# Patient Record
Sex: Female | Born: 1985 | Race: Black or African American | Hispanic: No | Marital: Single | Smoking: Current every day smoker
Health system: Southern US, Community
[De-identification: ages and names within clinical notes are randomized; demographics above are authoritative.]

## PROBLEM LIST (undated history)

## (undated) DIAGNOSIS — E079 Disorder of thyroid, unspecified: Secondary | ICD-10-CM

## (undated) DIAGNOSIS — R791 Abnormal coagulation profile: Secondary | ICD-10-CM

## (undated) DIAGNOSIS — K7030 Alcoholic cirrhosis of liver without ascites: Secondary | ICD-10-CM

---

## 2017-09-10 DIAGNOSIS — F17210 Nicotine dependence, cigarettes, uncomplicated: Secondary | ICD-10-CM | POA: Insufficient documentation

## 2017-09-12 DIAGNOSIS — D259 Leiomyoma of uterus, unspecified: Secondary | ICD-10-CM | POA: Insufficient documentation

## 2017-11-09 DIAGNOSIS — D509 Iron deficiency anemia, unspecified: Secondary | ICD-10-CM | POA: Insufficient documentation

## 2018-12-06 DIAGNOSIS — Z3042 Encounter for surveillance of injectable contraceptive: Secondary | ICD-10-CM | POA: Insufficient documentation

## 2019-11-14 DIAGNOSIS — R1013 Epigastric pain: Secondary | ICD-10-CM | POA: Insufficient documentation

## 2021-06-17 ENCOUNTER — Inpatient Hospital Stay (HOSPITAL_COMMUNITY): Payer: Medicaid Other

## 2021-06-17 ENCOUNTER — Emergency Department (HOSPITAL_COMMUNITY): Payer: Medicaid Other

## 2021-06-17 ENCOUNTER — Encounter (HOSPITAL_COMMUNITY): Payer: Self-pay

## 2021-06-17 ENCOUNTER — Other Ambulatory Visit: Payer: Self-pay

## 2021-06-17 ENCOUNTER — Inpatient Hospital Stay (HOSPITAL_COMMUNITY)
Admission: EM | Admit: 2021-06-17 | Discharge: 2021-06-30 | DRG: 432 | Disposition: A | Discharge status: Home Health | Payer: Medicaid Other | Attending: Internal Medicine | Admitting: Internal Medicine

## 2021-06-17 DIAGNOSIS — K7682 Hepatic encephalopathy: Secondary | ICD-10-CM

## 2021-06-17 DIAGNOSIS — K7460 Unspecified cirrhosis of liver: Secondary | ICD-10-CM

## 2021-06-17 DIAGNOSIS — Z20822 Contact with and (suspected) exposure to covid-19: Secondary | ICD-10-CM | POA: Diagnosis present

## 2021-06-17 DIAGNOSIS — Z781 Physical restraint status: Secondary | ICD-10-CM

## 2021-06-17 DIAGNOSIS — D62 Acute posthemorrhagic anemia: Secondary | ICD-10-CM

## 2021-06-17 DIAGNOSIS — K7040 Alcoholic hepatic failure without coma: Secondary | ICD-10-CM | POA: Diagnosis present

## 2021-06-17 DIAGNOSIS — K922 Gastrointestinal hemorrhage, unspecified: Secondary | ICD-10-CM

## 2021-06-17 DIAGNOSIS — G928 Other toxic encephalopathy: Secondary | ICD-10-CM | POA: Diagnosis not present

## 2021-06-17 DIAGNOSIS — F10231 Alcohol dependence with withdrawal delirium: Secondary | ICD-10-CM | POA: Diagnosis not present

## 2021-06-17 DIAGNOSIS — I959 Hypotension, unspecified: Secondary | ICD-10-CM | POA: Diagnosis present

## 2021-06-17 DIAGNOSIS — R8271 Bacteriuria: Secondary | ICD-10-CM | POA: Diagnosis present

## 2021-06-17 DIAGNOSIS — D689 Coagulation defect, unspecified: Secondary | ICD-10-CM | POA: Diagnosis present

## 2021-06-17 DIAGNOSIS — Z9289 Personal history of other medical treatment: Secondary | ICD-10-CM

## 2021-06-17 DIAGNOSIS — G9340 Encephalopathy, unspecified: Secondary | ICD-10-CM

## 2021-06-17 DIAGNOSIS — J811 Chronic pulmonary edema: Secondary | ICD-10-CM | POA: Diagnosis present

## 2021-06-17 DIAGNOSIS — E872 Acidosis: Secondary | ICD-10-CM | POA: Diagnosis present

## 2021-06-17 DIAGNOSIS — D6959 Other secondary thrombocytopenia: Secondary | ICD-10-CM | POA: Diagnosis present

## 2021-06-17 DIAGNOSIS — E8809 Other disorders of plasma-protein metabolism, not elsewhere classified: Secondary | ICD-10-CM | POA: Diagnosis present

## 2021-06-17 DIAGNOSIS — E538 Deficiency of other specified B group vitamins: Secondary | ICD-10-CM | POA: Diagnosis present

## 2021-06-17 DIAGNOSIS — J9601 Acute respiratory failure with hypoxia: Secondary | ICD-10-CM | POA: Diagnosis present

## 2021-06-17 DIAGNOSIS — K7031 Alcoholic cirrhosis of liver with ascites: Secondary | ICD-10-CM | POA: Diagnosis present

## 2021-06-17 DIAGNOSIS — T424X5A Adverse effect of benzodiazepines, initial encounter: Secondary | ICD-10-CM | POA: Diagnosis not present

## 2021-06-17 DIAGNOSIS — F17210 Nicotine dependence, cigarettes, uncomplicated: Secondary | ICD-10-CM | POA: Diagnosis present

## 2021-06-17 DIAGNOSIS — K7290 Hepatic failure, unspecified without coma: Secondary | ICD-10-CM | POA: Diagnosis present

## 2021-06-17 DIAGNOSIS — R188 Other ascites: Secondary | ICD-10-CM

## 2021-06-17 DIAGNOSIS — D539 Nutritional anemia, unspecified: Secondary | ICD-10-CM | POA: Diagnosis present

## 2021-06-17 DIAGNOSIS — E876 Hypokalemia: Secondary | ICD-10-CM | POA: Diagnosis not present

## 2021-06-17 DIAGNOSIS — K766 Portal hypertension: Secondary | ICD-10-CM | POA: Diagnosis present

## 2021-06-17 DIAGNOSIS — N179 Acute kidney failure, unspecified: Secondary | ICD-10-CM | POA: Diagnosis present

## 2021-06-17 DIAGNOSIS — Z5900 Homelessness unspecified: Secondary | ICD-10-CM | POA: Diagnosis not present

## 2021-06-17 DIAGNOSIS — E8770 Fluid overload, unspecified: Secondary | ICD-10-CM | POA: Diagnosis present

## 2021-06-17 DIAGNOSIS — J189 Pneumonia, unspecified organism: Secondary | ICD-10-CM | POA: Diagnosis present

## 2021-06-17 DIAGNOSIS — R062 Wheezing: Secondary | ICD-10-CM

## 2021-06-17 DIAGNOSIS — K7201 Acute and subacute hepatic failure with coma: Secondary | ICD-10-CM

## 2021-06-17 DIAGNOSIS — A419 Sepsis, unspecified organism: Secondary | ICD-10-CM

## 2021-06-17 DIAGNOSIS — R161 Splenomegaly, not elsewhere classified: Secondary | ICD-10-CM | POA: Diagnosis present

## 2021-06-17 DIAGNOSIS — K7011 Alcoholic hepatitis with ascites: Secondary | ICD-10-CM | POA: Diagnosis present

## 2021-06-17 DIAGNOSIS — E871 Hypo-osmolality and hyponatremia: Secondary | ICD-10-CM | POA: Diagnosis present

## 2021-06-17 DIAGNOSIS — K7200 Acute and subacute hepatic failure without coma: Secondary | ICD-10-CM

## 2021-06-17 DIAGNOSIS — Z8249 Family history of ischemic heart disease and other diseases of the circulatory system: Secondary | ICD-10-CM

## 2021-06-17 DIAGNOSIS — D649 Anemia, unspecified: Secondary | ICD-10-CM

## 2021-06-17 DIAGNOSIS — Z0189 Encounter for other specified special examinations: Secondary | ICD-10-CM

## 2021-06-17 HISTORY — DX: Disorder of thyroid, unspecified: E07.9

## 2021-06-17 LAB — CBC WITH DIFFERENTIAL/PLATELET
Abs Immature Granulocytes: 0.15 10*3/uL — ABNORMAL HIGH (ref 0.00–0.07)
Basophils Absolute: 0 10*3/uL (ref 0.0–0.1)
Basophils Relative: 0 %
Eosinophils Absolute: 0 10*3/uL (ref 0.0–0.5)
Eosinophils Relative: 0 %
HCT: 7.3 % — ABNORMAL LOW (ref 36.0–46.0)
Hemoglobin: 1.8 g/dL — CL (ref 12.0–15.0)
Immature Granulocytes: 1 %
Lymphocytes Relative: 11 %
Lymphs Abs: 2.1 10*3/uL (ref 0.7–4.0)
MCH: 31 pg (ref 26.0–34.0)
MCHC: 24.7 g/dL — ABNORMAL LOW (ref 30.0–36.0)
MCV: 125.9 fL — ABNORMAL HIGH (ref 80.0–100.0)
Monocytes Absolute: 2.1 10*3/uL — ABNORMAL HIGH (ref 0.1–1.0)
Monocytes Relative: 11 %
Neutro Abs: 14.4 10*3/uL — ABNORMAL HIGH (ref 1.7–7.7)
Neutrophils Relative %: 77 %
Platelets: 137 10*3/uL — ABNORMAL LOW (ref 150–400)
RBC: 0.58 MIL/uL — ABNORMAL LOW (ref 3.87–5.11)
WBC: 18.8 10*3/uL — ABNORMAL HIGH (ref 4.0–10.5)
nRBC: 1.3 % — ABNORMAL HIGH (ref 0.0–0.2)

## 2021-06-17 LAB — COMPREHENSIVE METABOLIC PANEL
ALT: 26 U/L (ref 0–44)
AST: 125 U/L — ABNORMAL HIGH (ref 15–41)
Albumin: 1.3 g/dL — ABNORMAL LOW (ref 3.5–5.0)
Alkaline Phosphatase: 117 U/L (ref 38–126)
Anion gap: 19 — ABNORMAL HIGH (ref 5–15)
BUN: 34 mg/dL — ABNORMAL HIGH (ref 6–20)
CO2: 11 mmol/L — ABNORMAL LOW (ref 22–32)
Calcium: 7.9 mg/dL — ABNORMAL LOW (ref 8.9–10.3)
Chloride: 103 mmol/L (ref 98–111)
Creatinine, Ser: 3.32 mg/dL — ABNORMAL HIGH (ref 0.44–1.00)
GFR, Estimated: 18 mL/min — ABNORMAL LOW (ref >60)
Glucose, Bld: 64 mg/dL — ABNORMAL LOW (ref 70–99)
Potassium: 5 mmol/L (ref 3.5–5.1)
Sodium: 133 mmol/L — ABNORMAL LOW (ref 135–145)
Total Bilirubin: 5.9 mg/dL — ABNORMAL HIGH (ref 0.3–1.2)
Total Protein: 8 g/dL (ref 6.5–8.1)

## 2021-06-17 LAB — VITAMIN B12: Vitamin B-12: 1496 pg/mL — ABNORMAL HIGH (ref 180–914)

## 2021-06-17 LAB — URINALYSIS, ROUTINE W REFLEX MICROSCOPIC
Glucose, UA: NEGATIVE mg/dL
Hgb urine dipstick: NEGATIVE
Ketones, ur: 15 mg/dL — AB
Nitrite: NEGATIVE
Protein, ur: 30 mg/dL — AB
Specific Gravity, Urine: 1.02 (ref 1.005–1.030)
pH: 5 (ref 5.0–8.0)

## 2021-06-17 LAB — POCT I-STAT 7, (LYTES, BLD GAS, ICA,H+H)
Acid-base deficit: 1 mmol/L (ref 0.0–2.0)
Bicarbonate: 24.1 mmol/L (ref 20.0–28.0)
Calcium, Ion: 1.13 mmol/L — ABNORMAL LOW (ref 1.15–1.40)
HCT: 18 % — ABNORMAL LOW (ref 36.0–46.0)
Hemoglobin: 6.1 g/dL — CL (ref 12.0–15.0)
O2 Saturation: 100 %
Patient temperature: 97.4
Potassium: 4.1 mmol/L (ref 3.5–5.1)
Sodium: 139 mmol/L (ref 135–145)
TCO2: 25 mmol/L (ref 22–32)
pCO2 arterial: 41.4 mmHg (ref 32.0–48.0)
pH, Arterial: 7.37 (ref 7.350–7.450)
pO2, Arterial: 503 mmHg — ABNORMAL HIGH (ref 83.0–108.0)

## 2021-06-17 LAB — URINALYSIS, MICROSCOPIC (REFLEX): WBC, UA: >50 WBC/hpf (ref 0–5)

## 2021-06-17 LAB — RESP PANEL BY RT-PCR (FLU A&B, COVID) ARPGX2
Influenza A by PCR: NEGATIVE
Influenza B by PCR: NEGATIVE
SARS Coronavirus 2 by RT PCR: NEGATIVE

## 2021-06-17 LAB — RETICULOCYTES
Immature Retic Fract: 35.7 % — ABNORMAL HIGH (ref 2.3–15.9)
RBC.: 1.35 MIL/uL — ABNORMAL LOW (ref 3.87–5.11)
Retic Count, Absolute: 89.8 10*3/uL (ref 19.0–186.0)
Retic Ct Pct: 6.8 % — ABNORMAL HIGH (ref 0.4–3.1)

## 2021-06-17 LAB — IRON AND TIBC
Iron: 41 ug/dL (ref 28–170)
Saturation Ratios: 21 % (ref 10.4–31.8)
TIBC: 193 ug/dL — ABNORMAL LOW (ref 250–450)
UIBC: 152 ug/dL

## 2021-06-17 LAB — PREPARE RBC (CROSSMATCH)

## 2021-06-17 LAB — POC OCCULT BLOOD, ED: Fecal Occult Bld: NEGATIVE

## 2021-06-17 LAB — FERRITIN: Ferritin: 52 ng/mL (ref 11–307)

## 2021-06-17 LAB — APTT: aPTT: 44 seconds — ABNORMAL HIGH (ref 24–36)

## 2021-06-17 LAB — CBG MONITORING, ED
Glucose-Capillary: 126 mg/dL — ABNORMAL HIGH (ref 70–99)
Glucose-Capillary: 62 mg/dL — ABNORMAL LOW (ref 70–99)
Glucose-Capillary: 118 mg/dL — ABNORMAL HIGH (ref 70–99)
Glucose-Capillary: 105 mg/dL — ABNORMAL HIGH (ref 70–99)

## 2021-06-17 LAB — PROTIME-INR
INR: 2.3 — ABNORMAL HIGH (ref 0.8–1.2)
Prothrombin Time: 25 seconds — ABNORMAL HIGH (ref 11.4–15.2)

## 2021-06-17 LAB — LACTIC ACID, PLASMA: Lactic Acid, Venous: 10.6 mmol/L (ref 0.5–1.9)

## 2021-06-17 LAB — HEMOGLOBIN AND HEMATOCRIT, BLOOD
HCT: 14.1 % — ABNORMAL LOW (ref 36.0–46.0)
Hemoglobin: 4.2 g/dL — CL (ref 12.0–15.0)

## 2021-06-17 LAB — GLUCOSE, CAPILLARY
Glucose-Capillary: 112 mg/dL — ABNORMAL HIGH (ref 70–99)
Glucose-Capillary: 120 mg/dL — ABNORMAL HIGH (ref 70–99)

## 2021-06-17 LAB — I-STAT BETA HCG BLOOD, ED (MC, WL, AP ONLY): I-stat hCG, quantitative: <5 m[IU]/mL (ref <5)

## 2021-06-17 LAB — FOLATE: Folate: 5.6 ng/mL — ABNORMAL LOW (ref >5.9)

## 2021-06-17 LAB — ABO/RH: ABO/RH(D): B POS

## 2021-06-17 LAB — AMMONIA: Ammonia: 82 umol/L — ABNORMAL HIGH (ref 9–35)

## 2021-06-17 MED ORDER — ORAL CARE MOUTH RINSE
15.0000 mL | OROMUCOSAL | Status: DC
  Administered 2021-06-17 – 2021-06-18 (×9): 15 mL via OROMUCOSAL

## 2021-06-17 MED ORDER — LACTATED RINGERS IV BOLUS (SEPSIS)
1000.0000 mL | Freq: Once | INTRAVENOUS | Status: AC
  Administered 2021-06-17: 1000 mL via INTRAVENOUS

## 2021-06-17 MED ORDER — SODIUM CHLORIDE 0.9 % IV SOLN
250.0000 mL | INTRAVENOUS | Status: DC
  Administered 2021-06-17 – 2021-06-18 (×2): 250 mL via INTRAVENOUS

## 2021-06-17 MED ORDER — LACTULOSE ENEMA
300.0000 mL | Freq: Every day | ORAL | Status: DC
  Filled 2021-06-17: qty 300

## 2021-06-17 MED ORDER — SODIUM CHLORIDE 0.9 % IV SOLN
50.0000 ug/h | INTRAVENOUS | Status: DC
  Administered 2021-06-17 – 2021-06-18 (×2): 50 ug/h via INTRAVENOUS
  Filled 2021-06-17 (×2): qty 1

## 2021-06-17 MED ORDER — SODIUM CHLORIDE 0.9 % IV SOLN
2.0000 g | INTRAVENOUS | Status: DC
  Administered 2021-06-17 – 2021-06-18 (×2): 2 g via INTRAVENOUS
  Filled 2021-06-17 (×2): qty 20

## 2021-06-17 MED ORDER — ROCURONIUM BROMIDE 10 MG/ML (PF) SYRINGE
PREFILLED_SYRINGE | INTRAVENOUS | Status: AC
  Filled 2021-06-17: qty 10

## 2021-06-17 MED ORDER — METRONIDAZOLE 500 MG/100ML IV SOLN
500.0000 mg | Freq: Once | INTRAVENOUS | Status: AC
  Administered 2021-06-17: 500 mg via INTRAVENOUS
  Filled 2021-06-17: qty 100

## 2021-06-17 MED ORDER — PROPOFOL 1000 MG/100ML IV EMUL: 0.0000 ug/kg/min | INTRAVENOUS | Status: DC

## 2021-06-17 MED ORDER — POLYETHYLENE GLYCOL 3350 17 G PO PACK: 17.0000 g | PACK | Freq: Every day | ORAL | Status: DC

## 2021-06-17 MED ORDER — PANTOPRAZOLE SODIUM 40 MG IV SOLR: 40.0000 mg | Freq: Every day | INTRAVENOUS | Status: DC

## 2021-06-17 MED ORDER — FENTANYL CITRATE (PF) 100 MCG/2ML IJ SOLN
50.0000 ug | INTRAMUSCULAR | Status: DC | PRN
  Administered 2021-06-18: 50 ug via INTRAVENOUS
  Filled 2021-06-17: qty 2

## 2021-06-17 MED ORDER — FUROSEMIDE 10 MG/ML IJ SOLN
20.0000 mg | Freq: Once | INTRAMUSCULAR | Status: AC
  Administered 2021-06-17: 20 mg via INTRAVENOUS
  Filled 2021-06-17: qty 2

## 2021-06-17 MED ORDER — SODIUM CHLORIDE 0.9% IV SOLUTION: Freq: Once | INTRAVENOUS | Status: DC

## 2021-06-17 MED ORDER — PANTOPRAZOLE SODIUM 40 MG IV SOLR
40.0000 mg | Freq: Two times a day (BID) | INTRAVENOUS | Status: DC
  Administered 2021-06-17 – 2021-06-18 (×3): 40 mg via INTRAVENOUS
  Filled 2021-06-17 (×3): qty 40

## 2021-06-17 MED ORDER — DEXTROSE 50 % IV SOLN
INTRAVENOUS | Status: AC
  Administered 2021-06-17: 50 mL
  Filled 2021-06-17: qty 50

## 2021-06-17 MED ORDER — SODIUM CHLORIDE 0.9 % IV SOLN
10.0000 mL/h | Freq: Once | INTRAVENOUS | Status: AC
  Administered 2021-06-17: 10 mL/h via INTRAVENOUS

## 2021-06-17 MED ORDER — LACTULOSE 10 GM/15ML PO SOLN
30.0000 g | Freq: Two times a day (BID) | ORAL | Status: DC
  Administered 2021-06-17 – 2021-06-18 (×3): 30 g
  Filled 2021-06-17 (×3): qty 45

## 2021-06-17 MED ORDER — SODIUM CHLORIDE 0.9 % IV SOLN
2.0000 g | Freq: Once | INTRAVENOUS | Status: AC
  Administered 2021-06-17: 2 g via INTRAVENOUS
  Filled 2021-06-17: qty 2

## 2021-06-17 MED ORDER — CHLORHEXIDINE GLUCONATE 0.12% ORAL RINSE (MEDLINE KIT)
15.0000 mL | Freq: Two times a day (BID) | OROMUCOSAL | Status: DC
  Administered 2021-06-17 – 2021-06-18 (×2): 15 mL via OROMUCOSAL

## 2021-06-17 MED ORDER — MIDAZOLAM HCL 2 MG/2ML IJ SOLN
INTRAMUSCULAR | Status: AC
  Administered 2021-06-17: 1 mg via INTRAVENOUS
  Filled 2021-06-17: qty 2

## 2021-06-17 MED ORDER — FENTANYL CITRATE (PF) 100 MCG/2ML IJ SOLN: 50.0000 ug | INTRAMUSCULAR | Status: DC | PRN

## 2021-06-17 MED ORDER — FENTANYL CITRATE (PF) 100 MCG/2ML IJ SOLN
INTRAMUSCULAR | Status: AC | PRN
  Administered 2021-06-17: 50 ug via INTRAVENOUS

## 2021-06-17 MED ORDER — VANCOMYCIN HCL 10 G IV SOLR
1750.0000 mg | Freq: Once | INTRAVENOUS | Status: AC
  Administered 2021-06-17: 1750 mg via INTRAVENOUS
  Filled 2021-06-17: qty 1750

## 2021-06-17 MED ORDER — THIAMINE HCL 100 MG/ML IJ SOLN
100.0000 mg | Freq: Once | INTRAMUSCULAR | Status: AC
  Administered 2021-06-17: 100 mg via INTRAVENOUS
  Filled 2021-06-17: qty 2

## 2021-06-17 MED ORDER — NOREPINEPHRINE 4 MG/250ML-% IV SOLN
INTRAVENOUS | Status: AC
  Administered 2021-06-17: 2 ug/min via INTRAVENOUS
  Filled 2021-06-17: qty 250

## 2021-06-17 MED ORDER — ROCURONIUM BROMIDE 50 MG/5ML IV SOLN
INTRAVENOUS | Status: AC | PRN
  Administered 2021-06-17: 80 mg via INTRAVENOUS

## 2021-06-17 MED ORDER — NOREPINEPHRINE 4 MG/250ML-% IV SOLN
2.0000 ug/min | INTRAVENOUS | Status: DC
  Administered 2021-06-18: 2 ug/min via INTRAVENOUS

## 2021-06-17 MED ORDER — SODIUM CHLORIDE 0.9 % IV SOLN
500.0000 mg | INTRAVENOUS | Status: DC
  Administered 2021-06-17 – 2021-06-18 (×2): 500 mg via INTRAVENOUS
  Filled 2021-06-17 (×3): qty 500

## 2021-06-17 MED ORDER — DOCUSATE SODIUM 50 MG/5ML PO LIQD: 100.0000 mg | Freq: Two times a day (BID) | ORAL | Status: DC

## 2021-06-17 MED ORDER — VANCOMYCIN VARIABLE DOSE PER UNSTABLE RENAL FUNCTION (PHARMACIST DOSING): Status: DC

## 2021-06-17 MED ORDER — ETOMIDATE 2 MG/ML IV SOLN
INTRAVENOUS | Status: AC | PRN
  Administered 2021-06-17: 20 mg via INTRAVENOUS

## 2021-06-17 MED ORDER — SODIUM CHLORIDE 0.9 % IV SOLN: 2.0000 g | INTRAVENOUS | Status: DC

## 2021-06-17 MED ORDER — THIAMINE HCL 100 MG PO TABS
500.0000 mg | ORAL_TABLET | Freq: Every day | ORAL | Status: DC
  Administered 2021-06-17: 500 mg
  Filled 2021-06-17 (×2): qty 5

## 2021-06-17 MED ORDER — PHENYLEPHRINE 40 MCG/ML (10ML) SYRINGE FOR IV PUSH (FOR BLOOD PRESSURE SUPPORT)
120.0000 ug | PREFILLED_SYRINGE | Freq: Once | INTRAVENOUS | Status: AC
  Administered 2021-06-17: 120 ug via INTRAVENOUS

## 2021-06-17 MED ORDER — LACTATED RINGERS IV SOLN: INTRAVENOUS | Status: DC

## 2021-06-17 MED ORDER — SUCCINYLCHOLINE CHLORIDE 200 MG/10ML IV SOSY
PREFILLED_SYRINGE | INTRAVENOUS | Status: AC
  Filled 2021-06-17: qty 10

## 2021-06-17 MED ORDER — MIDAZOLAM HCL 2 MG/2ML IJ SOLN: 1.0000 mg | Freq: Once | INTRAMUSCULAR | Status: AC

## 2021-06-17 MED ORDER — ETOMIDATE 2 MG/ML IV SOLN
INTRAVENOUS | Status: AC
  Filled 2021-06-17: qty 20

## 2021-06-17 MED ORDER — LACTULOSE 10 GM/15ML PO SOLN
30.0000 g | Freq: Every day | ORAL | Status: DC
  Filled 2021-06-17: qty 45

## 2021-06-17 MED ORDER — LEVETIRACETAM IN NACL 500 MG/100ML IV SOLN
500.0000 mg | Freq: Two times a day (BID) | INTRAVENOUS | Status: DC
  Administered 2021-06-18: 500 mg via INTRAVENOUS
  Filled 2021-06-17 (×2): qty 100

## 2021-06-17 MED ORDER — OCTREOTIDE LOAD VIA INFUSION
50.0000 ug | Freq: Once | INTRAVENOUS | Status: AC
  Administered 2021-06-17: 50 ug via INTRAVENOUS
  Filled 2021-06-17: qty 25

## 2021-06-17 MED ORDER — FENTANYL CITRATE (PF) 100 MCG/2ML IJ SOLN
INTRAMUSCULAR | Status: AC
  Filled 2021-06-17: qty 2

## 2021-06-17 MED ORDER — FOLIC ACID 1 MG PO TABS
1.0000 mg | ORAL_TABLET | Freq: Every day | ORAL | Status: DC
  Administered 2021-06-17 – 2021-06-18 (×2): 1 mg
  Filled 2021-06-17 (×2): qty 1

## 2021-06-17 MED ORDER — LEVETIRACETAM IN NACL 1500 MG/100ML IV SOLN
1500.0000 mg | Freq: Once | INTRAVENOUS | Status: AC
  Administered 2021-06-17: 1500 mg via INTRAVENOUS

## 2021-06-17 MED ORDER — LACTULOSE 10 GM/15ML PO SOLN: 30.0000 g | Freq: Two times a day (BID) | ORAL | Status: DC

## 2021-06-17 NOTE — ED Notes (Signed)
Critical Care provider at the bedside. Provider stated pt sounds wheezy to run blood not fast. Rate should not exceed 150 ml.

## 2021-06-17 NOTE — ED Triage Notes (Signed)
Pt from [facility] with ems for ams and possible sepsis. Pt lsn last night at unknown time by her boyfriend. This morning he could not wake her up and called ems. Pt responsive to painful stimuli but nonverbal. Pupils 38mm equal nonreactive. Pitting edema up to her abd, skin jaundice. They only hx provided is hypothyroidism and alcohol use. Last alcoholic drink was 2 weeks ago.  BP 82/44 HR 109 Rr35 ETCO2 15 CBG 96

## 2021-06-17 NOTE — ED Provider Notes (Addendum)
Patient admitted to the medical ICU for further care.  Severe anemia with a hemoglobin of 1.8.  Likely multifactorial.  No evidence of acute bleed.  She has gotten 2 units of packed red blood cells with improvement of her hemoglobin to 4.2.  Right now she will open eyes spontaneously but does not follow commands, she responds to painful stimuli.  She has a lactic of 10.  May be multifocal pneumonia.  COVID test is negative.  Some liver dysfunction with elevated bilirubin and ammonia and will consult gastroenterology.  Does not appear to be peritonitis on exam.  No fever.  Blood pressure has been stable in the 90s.  ICU team to admit.  ICU team will complete GI work-up with CT scans. [facility] team aware.  This chart was dictated using voice recognition software.  Despite best efforts to proofread,  errors can occur which can change the documentation meaning.    Virgina Norfolk, DO 06/17/21 1741    Virgina Norfolk, DO 06/17/21 1801    Virgina Norfolk, DO 06/17/21 1807

## 2021-06-17 NOTE — Procedures (Addendum)
Patient Name: [name]  MRN: [id number]  Epilepsy Attending: Charlsie Quest  Referring Physician/Provider: Pia Mau, PA Date: 06/17/2021 Duration: 23.87mins  Patient history: 35yo F with AMS. EEG to evaluate for seizure.   Level of alertness:  lethargic   AEDs during EEG study: LEV, propofol  Technical aspects: This EEG study was done with scalp electrodes positioned according to the 10-20 International system of electrode placement. Electrical activity was acquired at a sampling rate of 500Hz  and reviewed with a high frequency filter of 70Hz  and a low frequency filter of 1Hz . EEG data were recorded continuously and digitally stored.   Description: EEG showed continuous generalized 3 to 6 Hz theta-delta slowing. Generalized periodic discharges with triphasic morphology at  1-1.5Hz  were also noted. Hyperventilation and photic stimulation were not performed.     ABNORMALITY - Periodic discharges with triphasic morphology, generalized ( GPDs) - Continuous slow, generalized  IMPRESSION: This study showed generalized periodic discharges with triphasic morphology at  1-1.5Hz  which is on the ictal-interictal continuum with low to intermediate potential for seizures. Additionally, there is severe diffuse encephalopathy, nonspecific etiology but likely related to sedation, toxic-metabolic etiology. No seizures  were seen throughout the recording.  Shenell Rogalski 

## 2021-06-17 NOTE — Progress Notes (Signed)
LTM EEG hooked up and running - no initial skin breakdown - push button tested - neuro notified. [facility] monitored, Event button test confirmed by [facility].   

## 2021-06-17 NOTE — Sepsis Progress Note (Signed)
LA came back as error. Secure chat with bedside nurse and she is going to repeat the lab draw.

## 2021-06-17 NOTE — Progress Notes (Signed)
eLink Physician-Brief Progress Note Patient Name: Michelle Little DOB: 01-07-1986 MRN: [id number]   Date of Service  06/17/2021  HPI/Events of Note  17F with hx of EtOH abuse who was BIBA after her boyfriend could not awaken her this morning. Hypotensive to 82/44 in ED, WBC 18.8k, Hgb 1.8, Plt 137, HCO3 11, glucose 64. Patient was given 2L LR and 2 units of blood with repeat Hgb of 4.8. A third unit of blood was then ordered which is transfusing now.  Patient is relatively stable. She is intubated with vent settings of 430x15, 5, 100%. Vitals are HR 104 (ST), BP 118/84 (MAP 96), RR 15 (synchronous with vent), SpO2 100%. She is on levophed at 2 mcg/min which should be able to be weaned off.   eICU Interventions  # Neuro: - HE: Lactulose 30g BID per OG tube - Propofol/fentanyl for sedation/analgesia while intubated as needed. - EEG + Keppra also was ordered by primary team for concern of NCSE. Plan to d/c Keppra if EEG reassuring.  # Cardiac: - Hypotension: S/p fluid/blood resuscitation. On levophed for MAP >\= 65 -- continue to wean. Correct metabolic acidosis. Transfuse to Hgb 7. ABX empirically for sepsis/CAP/SBP ppx.  # Pulmonary: - Multifocal Pneumonia: Overt evidence of aspiration pneumonia on CT Chest. Started on ceftriaxone/azithromycin. - Respiratory Failure: Vent Mode: PRVC FiO2 (%):  [100 %] 100 % Set Rate:  [15 bmp-18 bmp] 15 bmp Vt Set:  [430 mL] 430 mL PEEP:  [5 cmH20] 5 cmH20 Plateau Pressure:  [19 cmH20-21 cmH20] 21 cmH20 - Stop mIVF given gross anarsarca and adequate volume/blood resuscitation at this point. Patient is making good UOP. - Wean FiO2 by SpO2 as tolerated.  # Heme: - Uncertain cause of severe anemia. ? GI bleed (most likely given cirrhosis but no melena or BRBPR, so chronic/intermittent bleeding higher on differential). - GI consult in AM with EGD/colonoscopy most likely needed. - Q6H CBCs (and a 1hr post-transfusion check after 3rd pRBC unit). Transfuse to  Hgb of 7.  # GI: - EtOH cirrhosis: Needs to be counseled regarding EtOH use / liver transplantation. - GI consult as above. - ? GI Bleed: Ceftriaxone for SBP ppx, Protonix IV Q12H, octreotide, Q6H CBC, transfuse to Hgb 7.  # ID: Aspiration pneumonia. Pyuria with negative nitrite on UA. - Ordered tracheal aspirate for culture. - F/u Bcx/UCx - CTX/azithromycin empirically.  # Renal: - Severe lactic acidosis: Resolved after volume resuscitation (latest ABG 7.37/41/503/25/BE -1.0).  # Endocrine: - Q4H CBGs  DVT PPX: SCDs only GI PPX: Protonix IV Q12H     Intervention Category Evaluation Type: New Patient Evaluation  Michelle Little 06/17/2021, 7:53 PM

## 2021-06-17 NOTE — ED Notes (Signed)
Notified provider about pt MAP (59/61)

## 2021-06-17 NOTE — ED Notes (Signed)
Patient transported to CT 

## 2021-06-17 NOTE — ED Provider Notes (Signed)
[facility] EMERGENCY DEPARTMENT Provider Note   CSN: [id number] Arrival date & time: 06/17/21  4818     History Chief Complaint  Patient presents with   Altered Mental Status   Code Sepsis    Michelle Little is a 35 y.o. female.  HPI 10:03 AM-called to the room to evaluate patient with apparent sepsis.  She presents by EMS.  She is unable to give history.   Boyfriend arrived later and reported that he was sleeping with her last night and this morning he could not make her wake up.  He states that for the last 2 weeks she has been having difficulty walking because of swelling in her legs.  She has stayed in bed pretty much all the time.  She has not been eating and drinking as much as usual.  He has not noticed anything else about her.  Level 5 caveat-altered mental status    Past Medical History:  Diagnosis Date   Thyroid disease     There are no problems to display for this patient.   History reviewed. No pertinent surgical history.   OB History   No obstetric history on file.     No family history on file.     Home Medications Prior to Admission medications   Medication Sig Start Date End Date Taking? Authorizing Provider  ibuprofen (ADVIL) 200 MG tablet Take 400 mg by mouth every 6 (six) hours as needed for headache or mild pain.   Yes [provider]    Allergies    Patient has no allergy information on record.  Review of Systems   Review of Systems  Unable to perform ROS: Mental status change   Physical Exam Updated Vital Signs BP (!) 93/58   Pulse (!) 103   Temp (!) 97 F (36.1 C) (Axillary)   Resp (!) 29   Ht 5\' 4"  (1.626 m)   Wt 81.6 kg   SpO2 100%   BMI 30.90 kg/m   Physical Exam Vitals and nursing note reviewed.  Constitutional:      General: She is in acute distress.     Appearance: She is well-developed. She is ill-appearing. She is not toxic-appearing.  HENT:     Head: Normocephalic and atraumatic.      Right Ear: External ear normal.     Left Ear: External ear normal.     Mouth/Throat:     Mouth: Mucous membranes are dry.     Pharynx: No oropharyngeal exudate or posterior oropharyngeal erythema.  Eyes:     General: Scleral icterus present.     Pupils: Pupils are equal, round, and reactive to light.  Neck:     Trachea: Phonation normal.  Cardiovascular:     Rate and Rhythm: Normal rate and regular rhythm.     Heart sounds: Normal heart sounds.  Pulmonary:     Effort: Pulmonary effort is normal. No respiratory distress.     Breath sounds: Normal breath sounds. No stridor.  Abdominal:     General: There is no distension.     Palpations: Abdomen is soft. There is no mass.     Tenderness: There is no abdominal tenderness.     Hernia: No hernia is present.  Musculoskeletal:        General: Normal range of motion.     Cervical back: Normal range of motion and neck supple.  Skin:    General: Skin is warm and dry.     Coloration: Skin  is jaundiced and pale.  Neurological:     Mental Status: She is lethargic.     Cranial Nerves: No cranial nerve deficit.     Motor: No abnormal muscle tone.     Coordination: Coordination normal.     Comments: Slurred speech. Uncooperative for following commands.  Psychiatric:     Comments: Lethargic    ED Results / Procedures / Treatments   Labs (all labs ordered are listed, but only abnormal results are displayed) Labs Reviewed  LACTIC ACID, PLASMA - Abnormal; Notable for the following components:      Result Value   Lactic Acid, Venous 10.6 (*)    All other components within normal limits  LACTIC ACID, PLASMA - Abnormal; Notable for the following components:   Lactic Acid, Venous 10.6 (*)    All other components within normal limits  COMPREHENSIVE METABOLIC PANEL - Abnormal; Notable for the following components:   Sodium 133 (*)    CO2 11 (*)    Glucose, Bld 64 (*)    BUN 34 (*)    Creatinine, Ser 3.32 (*)    Calcium 7.9 (*)     Albumin 1.3 (*)    AST 125 (*)    Total Bilirubin 5.9 (*)    GFR, Estimated 18 (*)    Anion gap 19 (*)    All other components within normal limits  PROTIME-INR - Abnormal; Notable for the following components:   Prothrombin Time 25.0 (*)    INR 2.3 (*)    All other components within normal limits  APTT - Abnormal; Notable for the following components:   aPTT 44 (*)    All other components within normal limits  URINALYSIS, ROUTINE W REFLEX MICROSCOPIC - Abnormal; Notable for the following components:   Color, Urine AMBER (*)    APPearance HAZY (*)    Bilirubin Urine LARGE (*)    Ketones, ur 15 (*)    Protein, ur 30 (*)    Leukocytes,Ua MODERATE (*)    All other components within normal limits  CBC WITH DIFFERENTIAL/PLATELET - Abnormal; Notable for the following components:   WBC 18.8 (*)    RBC 0.58 (*)    Hemoglobin 1.8 (*)    HCT 7.3 (*)    MCV 125.9 (*)    MCHC 24.7 (*)    Platelets 137 (*)    nRBC 1.3 (*)    Neutro Abs 14.4 (*)    Monocytes Absolute 2.1 (*)    Abs Immature Granulocytes 0.15 (*)    All other components within normal limits  URINALYSIS, MICROSCOPIC (REFLEX) - Abnormal; Notable for the following components:   Bacteria, UA FEW (*)    Non Squamous Epithelial PRESENT (*)    All other components within normal limits  AMMONIA - Abnormal; Notable for the following components:   Ammonia 82 (*)    All other components within normal limits  CBG MONITORING, ED - Abnormal; Notable for the following components:   Glucose-Capillary 62 (*)    All other components within normal limits  CBG MONITORING, ED - Abnormal; Notable for the following components:   Glucose-Capillary 126 (*)    All other components within normal limits  CULTURE, BLOOD (ROUTINE X 2)  CULTURE, BLOOD (ROUTINE X 2)  RESP PANEL BY RT-PCR (FLU A&B, COVID) ARPGX2  MRSA NEXT GEN BY PCR, NASAL  CBC WITH DIFFERENTIAL/PLATELET  BLOOD GAS, ARTERIAL  I-STAT BETA HCG BLOOD, ED (MC, WL, AP ONLY)   I-STAT VENOUS BLOOD GAS, ED  POC OCCULT BLOOD, ED  TYPE AND SCREEN  ABO/RH  PREPARE RBC (CROSSMATCH)  PREPARE RBC (CROSSMATCH)    EKG EKG Interpretation  Date/Time:  Monday June 17 2021 10:18:58 EDT Ventricular Rate:  109 PR Interval:  135 QRS Duration: 91 QT Interval:  358 QTC Calculation: 483 R Axis:   64 Text Interpretation: Sinus tachycardia Low voltage, precordial leads Nonspecific repol abnormality, diffuse leads No old tracing to compare Confirmed by Mancel Bale [phone]) on 06/17/2021 10:44:59 AM  Radiology DG Chest Port 1 View  Result Date: 06/17/2021 CLINICAL DATA:  Altered mental status Sepsis? EXAM: PORTABLE CHEST 1 VIEW COMPARISON:  None. FINDINGS: Heart size at upper limits of normal. Mild pulmonary vascular congestion. Bilateral perihilar and basilar airspace opacities. IMPRESSION: Bilateral lung opacities suspicious for multifocal pneumonia. Electronically Signed   By: Acquanetta Belling M.D.   On: 06/17/2021 11:01    Procedures .Critical Care  Date/Time: 06/18/2021 4:19 PM Performed by: Mancel Bale, MD Authorized by: Mancel Bale, MD   Critical care provider statement:    Critical care time (minutes):  95   Critical care start time:  06/17/2021 10:03 AM   Critical care end time:  06/17/2021 4:45 PM   Critical care time was exclusive of:  Separately billable procedures and treating other patients   Critical care was necessary to treat or prevent imminent or life-threatening deterioration of the following conditions:  Sepsis   Critical care was time spent personally by me on the following activities:  Blood draw for specimens, development of treatment plan with patient or surrogate, discussions with consultants, evaluation of patient's response to treatment, examination of patient, obtaining history from patient or surrogate, ordering and performing treatments and interventions, ordering and review of laboratory studies, pulse oximetry, re-evaluation of patient's  condition, review of old charts and ordering and review of radiographic studies   Medications Ordered in ED Medications  lactated ringers infusion ( Intravenous New Bag/Given 06/17/21 1041)  vancomycin variable dose per unstable renal function (pharmacist dosing) (has no administration in time range)  ceFEPIme (MAXIPIME) 2 g in sodium chloride 0.9 % 100 mL IVPB (has no administration in time range)  lactated ringers bolus 1,000 mL (0 mLs Intravenous Stopped 06/17/21 1124)    And  lactated ringers bolus 1,000 mL (0 mLs Intravenous Stopped 06/17/21 1155)  metroNIDAZOLE (FLAGYL) IVPB 500 mg (0 mg Intravenous Stopped 06/17/21 1204)  vancomycin (VANCOCIN) 1,750 mg in sodium chloride 0.9 % 500 mL IVPB (1,750 mg Intravenous New Bag/Given 06/17/21 1233)  ceFEPIme (MAXIPIME) 2 g in sodium chloride 0.9 % 100 mL IVPB (0 g Intravenous Stopped 06/17/21 1124)  dextrose 50 % solution (50 mLs  Given 06/17/21 1042)  0.9 %  sodium chloride infusion (0 mL/hr Intravenous Stopped 06/17/21 1134)    ED Course  I have reviewed the triage vital signs and the nursing notes.  Pertinent labs & imaging results that were available during my care of the patient were reviewed by me and considered in my medical decision making (see chart for details).  Clinical Course as of 06/18/21 1617  Mon Jun 17, 2021  1125 Hemoglobin 1.8.  This is not unexpected [EW]  1204 RBC: NO RESULT, LAB ERROR, SEE [id number] [PM]    Clinical Course User Index [EW] Mancel Bale, MD [PM] Wynetta Fines, MD   MDM Rules/Calculators/A&P                            Patient Vitals for  the past 24 hrs:  BP Temp Temp src Pulse Resp SpO2 Height Weight  06/17/21 1545 -- -- -- (!) 103 (!) 29 100 % -- --  06/17/21 1540 (!) 93/58 -- -- (!) 102 (!) 30 100 % -- --  06/17/21 1535 -- -- -- (!) 102 (!) 27 100 % -- --  06/17/21 1530 (!) 92/59 -- -- (!) 102 (!) 27 100 % -- --  06/17/21 1525 -- -- -- (!) 102 (!) 23 100 % -- --  06/17/21 1520 (!) 91/58 -- --  (!) 101 (!) 30 100 % -- --  06/17/21 1515 -- -- -- (!) 103 (!) 29 100 % -- --  06/17/21 1510 (!) 93/53 -- -- (!) 103 (!) 30 100 % -- --  06/17/21 1505 -- -- -- (!) 102 (!) 28 100 % -- --  06/17/21 1500 (!) 96/53 -- -- (!) 102 (!) 26 100 % -- --  06/17/21 1455 -- -- -- (!) 102 (!) 28 100 % -- --  06/17/21 1450 (!) 85/53 (!) 97 F (36.1 C) Axillary (!) 103 (!) 30 100 % -- --  06/17/21 1450 (!) 85/53 -- -- (!) 102 (!) 33 100 % -- --  06/17/21 1445 -- -- -- (!) 102 (!) 29 100 % -- --  06/17/21 1440 (!) 89/51 -- -- (!) 102 (!) 29 100 % -- --  06/17/21 1435 -- -- -- (!) 102 (!) 29 100 % -- --  06/17/21 1433 (!) 95/53 (!) 96.8 F (36 C) -- (!) 102 (!) 29 100 % -- --  06/17/21 1430 (!) 95/53 -- -- (!) 102 (!) 25 100 % -- --  06/17/21 1425 -- -- -- (!) 102 (!) 31 100 % -- --  06/17/21 1420 (!) 90/51 -- -- (!) 102 (!) 32 100 % -- --  06/17/21 1415 -- -- -- (!) 102 (!) 31 100 % -- --  06/17/21 1410 (!) 91/52 -- -- (!) 102 (!) 31 100 % -- --  06/17/21 1405 -- -- -- (!) 103 (!) 32 100 % -- --  06/17/21 1400 (!) 93/50 -- -- (!) 102 (!) 33 100 % -- --  06/17/21 1355 -- -- -- (!) 102 (!) 30 100 % -- --  06/17/21 1350 (!) 87/50 -- -- (!) 103 (!) 33 100 % -- --  06/17/21 1345 -- -- -- (!) 104 (!) 29 100 % -- --  06/17/21 1340 (!) 89/50 -- -- (!) 104 (!) 30 100 % -- --  06/17/21 1335 -- -- -- (!) 105 (!) 30 100 % -- --  06/17/21 1330 (!) 94/51 -- -- (!) 103 (!) 30 100 % -- --  06/17/21 1325 -- -- -- (!) 102 (!) 28 100 % -- --  06/17/21 1320 (!) 93/48 -- -- (!) 102 (!) 27 100 % -- --  06/17/21 1315 -- -- -- (!) 102 (!) 21 100 % -- --  06/17/21 1310 (!) 90/53 -- -- (!) 102 (!) 26 100 % -- --  06/17/21 1305 -- -- -- (!) 102 (!) 25 100 % -- --  06/17/21 1300 (!) 91/49 -- -- (!) 103 (!) 26 100 % -- --  06/17/21 1255 -- -- -- (!) 102 (!) 27 100 % -- --  06/17/21 1250 (!) 88/47 -- -- (!) 102 (!) 27 100 % -- --  06/17/21 1245 -- -- -- (!) 102 (!) 28 100 % -- --  06/17/21 1240 (!) 91/50 -- -- (!) 102 (!) 27  100 % -- --  06/17/21 1235 -- -- -- (!) 102 (!) 30 100 % -- --  06/17/21 1230 (!) 90/45 -- -- (!) 104 (!) 31 100 % -- --  06/17/21 1225 -- -- -- -- (!) 33 -- -- --  06/17/21 1221 (!) 89/53 (!) 96.8 F (36 C) Axillary (!) 104 (!) 33 100 % -- --  06/17/21 1220 (!) 89/53 -- -- (!) 104 (!) 33 100 % -- --  06/17/21 1215 -- -- -- -- (!) 31 -- -- --  06/17/21 1210 (!) 91/50 -- -- (!) 103 (!) 31 100 % -- --  06/17/21 1205 -- -- -- -- (!) 30 -- -- --  06/17/21 1200 (!) 88/52 (!) 96.6 F (35.9 C) Axillary (!) 103 (!) 30 100 % -- --  06/17/21 1155 -- -- -- -- (!) 35 -- -- --  06/17/21 1150 (!) 91/52 -- -- -- (!) 35 -- -- --  06/17/21 1145 -- -- -- -- (!) 34 -- -- --  06/17/21 1130 (!) 93/55 -- -- (!) 105 (!) 35 100 % -- --  06/17/21 1115 (!) 93/49 -- -- (!) 106 (!) 34 100 % -- --  06/17/21 1100 (!) 91/47 -- -- (!) 106 -- 100 % -- --  06/17/21 1050 (!) 88/54 -- -- (!) 106 -- 100 % -- --  06/17/21 1034 (!) 66/52 97.6 F (36.4 C) Oral (!) 107 (!) 36 100 % -- --  06/17/21 1000 -- -- -- -- -- --  (1.626 m) 81.6 kg    2:34 PM Reevaluation with update and discussion. After initial assessment and treatment, an updated evaluation reveals she remains lethargic.  CT ordered to evaluate for intracranial process. Mancel Bale   Medical Decision Making:  This patient is presenting for evaluation of altered mental status, which does require a range of treatment options, and is a complaint that involves a high risk of morbidity and mortality. The differential diagnoses include acute infection, metabolic disorder, cardiac disorder, toxic ingestion.. I decided to review old records, and in summary middle-aged female, living in a homeless shelter, presenting for altered mental status and leg swelling.  Minimal local history, history obtained through the EMR indicate that she has had pre-existing iron deficiency anemia, uterine fibroids, dyspepsia, and tobacco abuse I obtained additional historical information  from significant other at the bedside.  Clinical Laboratory Tests Ordered, included  sepsis bundle . Review indicates normal except lactate high, sodium low, CO2 low, glucose low, BUN high, creatinine high, calcium low, albumin low, AST high, total bilirubin high, GFR low, white count high, hemoglobin low, MCV high, hematocrit low, platelets low, PT/INR high, urinalysis abnormal with presence of bilirubin, ketones, protein, leukocytes and few bacteria with white cells. Radiologic Tests Ordered, included chest x-ray.  I independently Visualized: Radiographic images, which show bilateral infiltrates consistent with pneumonia  Cardiac Monitor Tracing which shows sinus tachycardia     Critical Interventions-clinical evaluation, laboratory testing, radiography, empiric treatment with high-volume saline boluses and antibiotics, nasal cannula oxygen, observation reassessment  After These Interventions, the Patient was reevaluated and was found with severe anemia likely contributing to weakness and decreased activity.  Patient with apparently worsening renal function, and hepatic disease.  Ammonia level elevated, suspect to be the cause of her decreased  responsiveness.  No recent comparison labs available.  Prior care documented in the EMR is in [place].  Patient is critically ill, transfusion 4 units ordered.  CT pending to evaluate for intracranial abnormalities however  suspect hyperammonemia is the likely cause for altered mental status.  CRITICAL CARE-yes Performed by: Mancel BaleElliott Jamiee Milholland   Nursing Notes Reviewed/ Care Coordinated Applicable Imaging Reviewed Interpretation of Laboratory Data incorporated into ED treatment  4:45 PM- Dr. Lockie Molauratolo to discuss admission with Hospitalist    Final Clinical Impression(s) / ED Diagnoses Final diagnoses:  Anemia, unspecified type  Hepatic encephalopathy (HCC)  Acute renal failure, unspecified acute renal failure type [place](HCC)    Rx / DC Orders ED  Discharge Orders     None        Mancel BaleWentz, Lillyana Majette, MD 06/18/21 [phone]

## 2021-06-17 NOTE — H&P (Signed)
NAME:  Michelle Little, MRN:  [id number], DOB:  1986/02/21, LOS: 0 ADMISSION DATE:  06/17/2021, CONSULTATION DATE:  7/25 REFERRING MD:  Dr. Effie Shy, CHIEF COMPLAINT:  severe anemia   History of Present Illness:  HPI obtained from medical chart review as patient remains encephalopathic   35 year old female with prior history of thyroid disease, IDA, tobacco and ETOH abuse [facility] after her boyfriend could not wake her up this morning.  He had reported that she has been having difficulty walking for the last 2 weeks due to swelling of her legs and has been mostly in the bed, with poor PO intake.  Reports her last ETOH use was 7/23.    Initially in ER, patient afebrile, hypotensive 82/44, ST 104, tachypneic, normal CBG, and normal room air saturations.  Labs significant for WBC 18.8, Hgb 1.8, Hct 7.3, plts 137, Na 133, K 5, bicarb 11, glucose 64, BUN 34, sCr 3.32, AG 19, albumin 1.3. AST 125 (normal ALT), t. Bili 5.9, LA 10.6-> 10.6, INR 2.3, ammonia 82, neg istat hCG quant, UA hazy/amber, large bili, 15 ketones, mod leuks, protein 30, > 50 bacteria, FOBT neg, normal CTH, and EKG showing ST without any acute STE/ STD changes, and CXR showing bilateral opacities suspicious for multifocal pneumonia.  Treated with 2L LR, and started on vancomycin and cefepime after cultures sent.  Repeat Hgb after 2 units 4.2.  Lactic remains elevated and mental status remains lethargic.  PCCM called for admit.  Pertinent  Medical History  Thyroid disease, IDA, uterine fibroids, tobacco and ETOH use (previous care everywhere in [facility] Events: Including procedures, antibiotic start and stop dates in addition to other pertinent events   Admitted to PCCM with encephalopathy and profound anemia, Hgb 1.8 initially, s/p 4 units PRBC, elevated lactate, ammonia, and AKI   7/25 SARS/ flu >> neg 7/25 Bcx2 >>  7/25 MRSA >>   7/25 vanc 7/25 cefepime  Interim History /  Subjective:  Hgb 1.8 increased to 4; currently receiving 3rd unit of blood On 2 liters [place]; sats 99%; breath sounds rales/crackles bilaterally Patient is not awake/oriented; opens eye to commands Lactic 10 FOB negative BP stable   Objective   Blood pressure (!) 100/53, pulse (!) 103, temperature (!) 97 F (36.1 C), temperature source Axillary, resp. rate (!) 28, height 5\' 4"  (1.626 m), weight 81.6 kg, SpO2 100 %.        Intake/Output Summary (Last 24 hours) at 06/17/2021 1725 Last data filed at 06/17/2021 1621 Gross per 24 hour  Intake 2012 ml  Output --  Net 2012 ml   Filed Weights   06/17/21 1000  Weight: 81.6 kg    Examination: General:  ill appearing female HEENT: MM pink/moist; scleral icterus present; [place] in place Neuro: PERRL; opens eyes to verbal; eyes appear to track to the left; not following other commands CV: s1s2, RRR, no m/r/g PULM:  crackles/rales bilaterally; on 2 l/m [place]; sats 99% GI: soft, bsx4 active  Extremities: warm/dry, 4+ BLE and BUE edema Skin: no rashes or lesions  BUN 34, creat 3.32 Glucose 64 increased to 118 after D10 given AST 125, Bili 5.9 Ammonia 82 Lactic 10.6 Hgb 1.8; now 4.2 after 2 units of blood WBC 18.8 Platelets 137 PT 25, PTT 44, INR 2.3  BC pending  CXR: b/l lung opacities; likely multifocal pneumonia CT head: negative  Resolved Hospital Problem list     Assessment & Plan:   Septic  shock w/ hypovolemic shock: hgb 1.8, increased to 4.2 after 2 units of blood; Bcx2 and urine culture pending; ppx antibiotics started; CXR multifocal pneumonia  acute on chronic blood loss anemia: s/p 2 transfusion hgb increased to 1.8 to 4.2; FOBT negative P: -giving 1 more unit; continue to transfuse for hgb <7 -H/H q6 -PPI -trend cbc/fever -trend lactate/procalcitonin -Bcx2 and urine culture pending -CT chest/abdomen/pelvis ordered   S/p intubation and mechanical ventilation due to AMS Multifocal pneumonia: CXR 7/25 bilateral lung  opacities P: -intubated for airway protection due to poor mental status -place on vent PRVC -VAP prevention in place -sedation for RASS 0 to -1 -Tracheal aspirate ordered -continue ceftriaxone/azithromycin for CAP ppx   Acute metabolic, hepatic encephalopathy: multifactorial due to increased ammonia or hepatic encephalopathy; CT head normal; UDS sent Hyperammonemia  Hx of alcohol abuse: last known alcohol use was 7/23 P:  -Lactulose ordered -UDS sent -EEG to rule out subclinical seizure -limit sedating meds -thiamine and folate -Keppra started for seizure ppx   Hypoglycemia P: -CBG monitoring q4; high risk for hypoglycemia with liver dysfunction -replete as needed   AKI with AGMA Lactic acidosis P: -Trend lactate -IV fluids -renal dose meds/avoid nephrotoxic agents  Decompensated liver failure:  MELD score 35 w/ 53% chance of 46-month mortality Hyperbilirubinemia P: -CT abdomen ordered -trend CMP/bili -GI consulted -PPI  Thrombocytopenia Leukocytosis P: -trend CBC    Best Practice (right click and "Reselect all SmartList Selections" daily)   Diet/type: NPO w/ meds via tube DVT prophylaxis: SCD; will need reevaluation after rule out bleed GI prophylaxis: PPI Lines: N/A Foley:  Yes, and it is still needed Code Status:  full code Last date of multidisciplinary goals of care discussion Ardis Rowan [phone]; Mother Burma Ketcher in [place]; 7/25 Fiance updated at bedside from Dr. Everardo All, gave consent for intubation procedure.]  Labs   CBC: Recent Labs  Lab 06/17/21 1015 06/17/21 1647  WBC NO RESULT, LAB ERROR, SEE M31661  18.8*  --   NEUTROABS Not Measured  14.4*  --   HGB NO RESULT, LAB ERROR, SEE M31661  1.8* 4.2*  HCT Not Measured  7.3* 14.1*  MCV Not Measured  125.9*  --   PLT Not Measured  137*  --     Basic Metabolic Panel: Recent Labs  Lab 06/17/21 1015  NA 133*  K 5.0  CL 103  CO2 11*  GLUCOSE  64*  BUN 34*  CREATININE 3.32*  CALCIUM 7.9*   GFR: Estimated Creatinine Clearance: 24 mL/min (A) (by C-G formula based on SCr of 3.32 mg/dL (H)). Recent Labs  Lab 06/17/21 1015 06/17/21 1402  WBC NO RESULT, LAB ERROR, SEE M31661  18.8*  --   LATICACIDVEN 10.6* 10.6*    Liver Function Tests: Recent Labs  Lab 06/17/21 1015  AST 125*  ALT 26  ALKPHOS 117  BILITOT 5.9*  PROT 8.0  ALBUMIN 1.3*   No results for input(s): LIPASE, AMYLASE in the last 168 hours. Recent Labs  Lab 06/17/21 1402  AMMONIA 82*    ABG No results found for: PHART, PCO2ART, PO2ART, HCO3, TCO2, ACIDBASEDEF, O2SAT   Coagulation Profile: Recent Labs  Lab 06/17/21 1015  INR 2.3*    Cardiac Enzymes: No results for input(s): CKTOTAL, CKMB, CKMBINDEX, TROPONINI in the last 168 hours.  HbA1C: No results found for: HGBA1C  CBG: Recent Labs  Lab 06/17/21 1036 06/17/21 1123 06/17/21 1702  GLUCAP 62* 126* 118*    Review of Systems:   Unable  to obtain from patient; obtained from chart and nurse and fiance at bedside  Past Medical History:  She,  has a past medical history of Thyroid disease.   Surgical History:  History reviewed. No pertinent surgical history.   Social History:      Family History:  Her family history is not on file.   Allergies Not on File   Home Medications  Prior to Admission medications   Medication Sig Start Date End Date Taking? Authorizing Provider  ibuprofen (ADVIL) 200 MG tablet Take 400 mg by mouth every 6 (six) hours as needed for headache or mild pain.   Yes [provider]     Critical care time: 45 minutes    JD Anselm Lis [place] [facility] 06/17/2021, 5:25 PM  Please see [email or web address] for pager details.  From 7A-7P if no response, please call [phone]. After hours, please call ELink [phone].

## 2021-06-17 NOTE — ED Notes (Signed)
First blood transfusion started at 12:10 and ended 14:30

## 2021-06-17 NOTE — Progress Notes (Signed)
EEG complete - results pending 

## 2021-06-17 NOTE — Progress Notes (Signed)
eLink Physician-Brief Progress Note Patient Name: Michelle Little DOB: [birth year] MRN: [id number]   Date of Service  06/17/2021  HPI/Events of Note  RN requests bair hugger.  eICU Interventions  Order entered.     Intervention Category Intermediate Interventions: Other:  Janae Bridgeman 06/17/2021, 9:43 PM

## 2021-06-17 NOTE — Sepsis Progress Note (Signed)
Notified bedside nurse of need to draw repeat lactic acid. 

## 2021-06-17 NOTE — Sepsis Progress Note (Signed)
Sepsis protocol is being followed by eLink. 

## 2021-06-17 NOTE — Progress Notes (Signed)
Pharmacy Antibiotic Note  Michelle Little is a 35 y.o. female admitted on 06/17/2021 with sepsis.  Pharmacy has been consulted for vancomycin and cefepime dosing.  Plan: Vancomycin 1750mg  x1 then variable dosing 2/2 elev Cr  Cefepime 2g IV q24h -Monitor renal function, clinical status, and antibiotic plan  Height: 5\' 4"  (162.6 cm) Weight: 81.6 kg (180 lb) IBW/kg (Calculated) : 54.7  Temp (24hrs), Avg:97.1 F (36.2 C), Min:96.6 F (35.9 C), Max:97.6 F (36.4 C)  Recent Labs  Lab 06/17/21 1015  WBC NO RESULT, LAB ERROR, SEE M31661  18.8*  CREATININE 3.32*  LATICACIDVEN 10.6*    Estimated Creatinine Clearance: 24 mL/min (A) (by C-G formula based on SCr of 3.32 mg/dL (H)).    Not on File  Antimicrobials this admission: Cefepime 7/25 >>  Vanc 7/25 >>   Dose adjustments this admission: N/A  Microbiology results: 7/25 BCx:   Thank you for allowing pharmacy to be a part of this patient's care.  8/25, PharmD, [facility] Emergency Medicine Clinical Pharmacist ED RPh Phone: [phone] Main RX: [phone]

## 2021-06-17 NOTE — Procedures (Addendum)
Intubation Procedure Note OG Tube placement  Michelle Little  [id number]  1986/08/23  Date:06/17/21  Time:6:59 PM   Provider Performing:Shemia Bevel Mechele Collin    Procedure: Intubation (31500) and OG tube placement  Indication(s) Respiratory Failure  Consent Risks of the procedure as well as the alternatives and risks of each were explained to the patient and/or caregiver.  Consent for the procedure was obtained and is signed in the bedside chart   Anesthesia Etomidate, Versed, Fentanyl, and Rocuronium   Time Out Verified patient identification, verified procedure, site/side was marked, verified correct patient position, special equipment/implants available, medications/allergies/relevant history reviewed, required imaging and test results available.   Sterile Technique Usual hand hygeine, masks, and gloves were used   Procedure Description Patient positioned in bed supine.  Sedation given as noted above.  Patient was intubated with endotracheal tube using Glidescope.  View was Grade 1 full glottis .  Number of attempts was 1.  Colorimetric CO2 detector was consistent with tracheal placement.  OG tube placed after intubation due to critical illness (high risk for upper GI bleed)   Complications/Tolerance None; patient tolerated the procedure well. Chest X-ray is ordered to verify placement.   EBL Minimal   Specimen(s) None  Mechele Collin, M.D. [facility] Pulmonary/Critical Care Medicine 06/17/2021 6:59 PM

## 2021-06-18 ENCOUNTER — Inpatient Hospital Stay (HOSPITAL_COMMUNITY): Payer: Medicaid Other

## 2021-06-18 DIAGNOSIS — I361 Nonrheumatic tricuspid (valve) insufficiency: Secondary | ICD-10-CM

## 2021-06-18 DIAGNOSIS — D649 Anemia, unspecified: Secondary | ICD-10-CM

## 2021-06-18 DIAGNOSIS — I340 Nonrheumatic mitral (valve) insufficiency: Secondary | ICD-10-CM

## 2021-06-18 DIAGNOSIS — N179 Acute kidney failure, unspecified: Secondary | ICD-10-CM

## 2021-06-18 LAB — COMPREHENSIVE METABOLIC PANEL
ALT: 29 U/L (ref 0–44)
AST: 121 U/L — ABNORMAL HIGH (ref 15–41)
Albumin: 1.2 g/dL — ABNORMAL LOW (ref 3.5–5.0)
Alkaline Phosphatase: 98 U/L (ref 38–126)
Anion gap: 9 (ref 5–15)
BUN: 39 mg/dL — ABNORMAL HIGH (ref 6–20)
CO2: 20 mmol/L — ABNORMAL LOW (ref 22–32)
Calcium: 7.6 mg/dL — ABNORMAL LOW (ref 8.9–10.3)
Chloride: 105 mmol/L (ref 98–111)
Creatinine, Ser: 2.8 mg/dL — ABNORMAL HIGH (ref 0.44–1.00)
GFR, Estimated: 22 mL/min — ABNORMAL LOW (ref >60)
Glucose, Bld: 131 mg/dL — ABNORMAL HIGH (ref 70–99)
Potassium: 3.9 mmol/L (ref 3.5–5.1)
Sodium: 134 mmol/L — ABNORMAL LOW (ref 135–145)
Total Bilirubin: 7.5 mg/dL — ABNORMAL HIGH (ref 0.3–1.2)
Total Protein: 7.1 g/dL (ref 6.5–8.1)

## 2021-06-18 LAB — BASIC METABOLIC PANEL
Anion gap: 10 (ref 5–15)
BUN: 40 mg/dL — ABNORMAL HIGH (ref 6–20)
CO2: 21 mmol/L — ABNORMAL LOW (ref 22–32)
Calcium: 7.4 mg/dL — ABNORMAL LOW (ref 8.9–10.3)
Chloride: 105 mmol/L (ref 98–111)
Creatinine, Ser: 2.58 mg/dL — ABNORMAL HIGH (ref 0.44–1.00)
GFR, Estimated: 24 mL/min — ABNORMAL LOW (ref >60)
Glucose, Bld: 114 mg/dL — ABNORMAL HIGH (ref 70–99)
Potassium: 3.9 mmol/L (ref 3.5–5.1)
Sodium: 136 mmol/L (ref 135–145)

## 2021-06-18 LAB — HEPATIC FUNCTION PANEL
ALT: 31 U/L (ref 0–44)
AST: 122 U/L — ABNORMAL HIGH (ref 15–41)
Albumin: 1.2 g/dL — ABNORMAL LOW (ref 3.5–5.0)
Alkaline Phosphatase: 102 U/L (ref 38–126)
Bilirubin, Direct: 3.9 mg/dL — ABNORMAL HIGH (ref 0.0–0.2)
Indirect Bilirubin: 3.8 mg/dL — ABNORMAL HIGH (ref 0.3–0.9)
Total Bilirubin: 7.7 mg/dL — ABNORMAL HIGH (ref 0.3–1.2)
Total Protein: 7.2 g/dL (ref 6.5–8.1)

## 2021-06-18 LAB — GLUCOSE, CAPILLARY
Glucose-Capillary: 123 mg/dL — ABNORMAL HIGH (ref 70–99)
Glucose-Capillary: 115 mg/dL — ABNORMAL HIGH (ref 70–99)
Glucose-Capillary: 112 mg/dL — ABNORMAL HIGH (ref 70–99)
Glucose-Capillary: 113 mg/dL — ABNORMAL HIGH (ref 70–99)
Glucose-Capillary: 104 mg/dL — ABNORMAL HIGH (ref 70–99)
Glucose-Capillary: 129 mg/dL — ABNORMAL HIGH (ref 70–99)

## 2021-06-18 LAB — HEPATITIS PANEL, ACUTE
HCV Ab: NONREACTIVE
Hep A IgM: NONREACTIVE
Hep B C IgM: NONREACTIVE
Hepatitis B Surface Ag: NONREACTIVE

## 2021-06-18 LAB — ECHOCARDIOGRAM COMPLETE
Area-P 1/2: 4.39 cm2
Height: 64 in
Radius: 0.3 cm
S' Lateral: 3.5 cm
Weight: 2656.1 oz

## 2021-06-18 LAB — DIC (DISSEMINATED INTRAVASCULAR COAGULATION)PANEL
D-Dimer, Quant: 19.02 ug/mL-FEU — ABNORMAL HIGH (ref 0.00–0.50)
Fibrinogen: 120 mg/dL — ABNORMAL LOW (ref 210–475)
INR: 2.1 — ABNORMAL HIGH (ref 0.8–1.2)
Platelets: 78 10*3/uL — ABNORMAL LOW (ref 150–400)
Prothrombin Time: 23.2 seconds — ABNORMAL HIGH (ref 11.4–15.2)
Smear Review: NONE SEEN
aPTT: 39 seconds — ABNORMAL HIGH (ref 24–36)

## 2021-06-18 LAB — LACTIC ACID, PLASMA
Lactic Acid, Venous: 10.6 mmol/L (ref 0.5–1.9)
Lactic Acid, Venous: 1.7 mmol/L (ref 0.5–1.9)

## 2021-06-18 LAB — TRIGLYCERIDES: Triglycerides: 123 mg/dL (ref <150)

## 2021-06-18 LAB — HEMOGLOBIN AND HEMATOCRIT, BLOOD
HCT: 20.8 % — ABNORMAL LOW (ref 36.0–46.0)
HCT: 20 % — ABNORMAL LOW (ref 36.0–46.0)
Hemoglobin: 7.5 g/dL — ABNORMAL LOW (ref 12.0–15.0)
Hemoglobin: 7.2 g/dL — ABNORMAL LOW (ref 12.0–15.0)

## 2021-06-18 LAB — PROTIME-INR
INR: 2.1 — ABNORMAL HIGH (ref 0.8–1.2)
Prothrombin Time: 23.2 seconds — ABNORMAL HIGH (ref 11.4–15.2)

## 2021-06-18 LAB — CBC
HCT: 15.1 % — ABNORMAL LOW (ref 36.0–46.0)
Hemoglobin: 6 g/dL — CL (ref 12.0–15.0)
MCH: 35.7 pg — ABNORMAL HIGH (ref 26.0–34.0)
MCHC: 39.7 g/dL — ABNORMAL HIGH (ref 30.0–36.0)
MCV: 89.9 fL (ref 80.0–100.0)
Platelets: 97 10*3/uL — ABNORMAL LOW (ref 150–400)
RBC: 1.68 MIL/uL — ABNORMAL LOW (ref 3.87–5.11)
RDW: 24 % — ABNORMAL HIGH (ref 11.5–15.5)
WBC: 14.8 10*3/uL — ABNORMAL HIGH (ref 4.0–10.5)
nRBC: 3.6 % — ABNORMAL HIGH (ref 0.0–0.2)

## 2021-06-18 LAB — HIV ANTIBODY (ROUTINE TESTING W REFLEX): HIV Screen 4th Generation wRfx: NONREACTIVE

## 2021-06-18 LAB — STREP PNEUMONIAE URINARY ANTIGEN: Strep Pneumo Urinary Antigen: NEGATIVE

## 2021-06-18 LAB — SODIUM, URINE, RANDOM: Sodium, Ur: <10 mmol/L

## 2021-06-18 LAB — AMMONIA: Ammonia: 51 umol/L — ABNORMAL HIGH (ref 9–35)

## 2021-06-18 LAB — MRSA NEXT GEN BY PCR, NASAL: MRSA by PCR Next Gen: DETECTED — AB

## 2021-06-18 LAB — PROCALCITONIN: Procalcitonin: 3.98 ng/mL

## 2021-06-18 LAB — MAGNESIUM: Magnesium: 1.5 mg/dL — ABNORMAL LOW (ref 1.7–2.4)

## 2021-06-18 LAB — TSH: TSH: 0.425 u[IU]/mL (ref 0.350–4.500)

## 2021-06-18 LAB — PREPARE RBC (CROSSMATCH)

## 2021-06-18 MED ORDER — SODIUM CHLORIDE 0.9% IV SOLUTION: Freq: Once | INTRAVENOUS | Status: DC

## 2021-06-18 MED ORDER — LACTULOSE 10 GM/15ML PO SOLN
30.0000 g | Freq: Two times a day (BID) | ORAL | Status: DC
  Administered 2021-06-19: 30 g via ORAL
  Filled 2021-06-18: qty 45

## 2021-06-18 MED ORDER — THIAMINE HCL 100 MG/ML IJ SOLN
500.0000 mg | Freq: Once | INTRAVENOUS | Status: AC
  Administered 2021-06-18: 500 mg via INTRAVENOUS
  Filled 2021-06-18: qty 5

## 2021-06-18 MED ORDER — ALBUMIN HUMAN 25 % IV SOLN
12.5000 g | Freq: Four times a day (QID) | INTRAVENOUS | Status: DC
  Administered 2021-06-18 – 2021-06-19 (×5): 12.5 g via INTRAVENOUS
  Filled 2021-06-18 (×7): qty 50

## 2021-06-18 MED ORDER — CHLORHEXIDINE GLUCONATE CLOTH 2 % EX PADS
6.0000 | MEDICATED_PAD | Freq: Every day | CUTANEOUS | Status: DC
  Administered 2021-06-17 – 2021-06-20 (×5): 6 via TOPICAL

## 2021-06-18 MED ORDER — THIAMINE HCL 100 MG PO TABS: 500.0000 mg | ORAL_TABLET | Freq: Every day | ORAL | Status: DC

## 2021-06-18 MED ORDER — MUPIROCIN 2 % EX OINT
1.0000 "application " | TOPICAL_OINTMENT | Freq: Two times a day (BID) | CUTANEOUS | Status: AC
  Administered 2021-06-18 – 2021-06-22 (×11): 1 via NASAL
  Filled 2021-06-18 (×3): qty 22

## 2021-06-18 MED ORDER — ORAL CARE MOUTH RINSE
15.0000 mL | Freq: Two times a day (BID) | OROMUCOSAL | Status: DC
  Administered 2021-06-18 – 2021-06-30 (×22): 15 mL via OROMUCOSAL

## 2021-06-18 MED ORDER — FOLIC ACID 1 MG PO TABS
1.0000 mg | ORAL_TABLET | Freq: Every day | ORAL | Status: DC
  Administered 2021-06-19: 1 mg via ORAL
  Filled 2021-06-18: qty 1

## 2021-06-18 NOTE — Progress Notes (Signed)
Attending:    Subjective: Presented on 7/25 with confusion, noted to have a Hgb of 1.3, Lactic acid 10, CT head OK, abdominal CT with findings of cirrhosis with ascites.  Intubated for airway protection and started on levophed 7/25.  Received 5 U PRBC yesterday.   Remains mechanically ventilated Awake and following commands on the ventilator this morning  Objective: Vitals:   06/18/21 0630 06/18/21 0645 06/18/21 0700 06/18/21 0735  BP: 92/68 99/64 101/67   Pulse: 97 97 95   Resp: (!) 22 19 16    Temp: 98.42 F (36.9 C) 98.42 F (36.9 C) 98.24 F (36.8 C)   TempSrc: Bladder     SpO2: 100% 100% 100% 98%  Weight:      Height:       Vent Mode: PRVC FiO2 (%):  [40 %-100 %] 40 % Set Rate:  [15 bmp-18 bmp] 15 bmp Vt Set:  [430 mL] 430 mL PEEP:  [5 cmH20] 5 cmH20 Plateau Pressure:  [14 cmH20-21 cmH20] 14 cmH20  Intake/Output Summary (Last 24 hours) at 06/18/2021 0937 Last data filed at 06/18/2021 0630 Gross per 24 hour  Intake 3926.55 ml  Output 450 ml  Net 3476.55 ml    General:  In bed on vent HENT: NCAT ETT in place PULM: CTA B, vent supported breathing CV: RRR, no mgr GI: BS+, soft, nontender, bulging flanks MSK: normal bulk and tone Neuro: awake, follows commands     CBC    Component Value Date/Time   WBC 14.8 (H) 06/18/2021 0223   RBC 1.68 (L) 06/18/2021 0223   HGB 7.5 (L) 06/18/2021 0648   HCT 20.8 (L) 06/18/2021 0648   PLT 78 (L) 06/18/2021 0731   MCV 89.9 06/18/2021 0223   MCH 35.7 (H) 06/18/2021 0223   MCHC 39.7 (H) 06/18/2021 0223   RDW 24.0 (H) 06/18/2021 0223   LYMPHSABS Not Measured 06/17/2021 1015   LYMPHSABS 2.1 06/17/2021 1015   MONOABS Not Measured 06/17/2021 1015   MONOABS 2.1 (H) 06/17/2021 1015   EOSABS Not Measured 06/17/2021 1015   EOSABS 0.0 06/17/2021 1015   BASOSABS Not Measured 06/17/2021 1015   BASOSABS 0.0 06/17/2021 1015    BMET    Component Value Date/Time   NA 134 (L) 06/18/2021 0223   K 3.9 06/18/2021 0223   CL 105  06/18/2021 0223   CO2 20 (L) 06/18/2021 0223   GLUCOSE 131 (H) 06/18/2021 0223   BUN 39 (H) 06/18/2021 0223   CREATININE 2.80 (H) 06/18/2021 0223   CALCIUM 7.6 (L) 06/18/2021 0223   GFRNONAA 22 (L) 06/18/2021 0223    CXR images low lung volumes, airspace disease on the right greater than left  Impression/Plan: Thrombocytopenia from cirrhosis> montior for bleeding, transfuse if bleeding and < 50K Anemia: multifactorial, macrocytic;no overt evidence of bleeding, GI consult pending; octreotide infusion to continue, check methylmelonic acid Advanced cirrhosis, presumed alcoholic > complete work up with echo, acute hepatitis panel, TSH, RUQ ultrasound, f/u GI recommendations CAP> ceftriaxone and azithromycin to continue Acute respiratory failure with hypoxemia> continue full vent support Hepatic encephalopathy> continue lactulose Question of seizures> she doesn't have this, stop keppra   My cc time 35 minutes  06/20/2021, MD [place] [facility] Pager: [phone] Cell: [phone] After 7pm: [phone]

## 2021-06-18 NOTE — Progress Notes (Signed)
NAME:  Michelle Little, MRN:  [id number], DOB:  [birth year], LOS: 1 ADMISSION DATE:  06/17/2021, CONSULTATION DATE:  7/25 REFERRING MD:  Effie Shy CHIEF COMPLAINT:  Severe anemia, encephalopathy   History of Present Illness:  35 year old female with prior history of thyroid disease, IDA, tobacco and ETOH abuse [facility] after her boyfriend could not wake her up this morning.  He had reported that she has been having difficulty walking for the last 2 weeks due to swelling of her legs and has been mostly in the bed, with poor PO intake.  Reports her last ETOH use was 7/23.   Initially in ER, patient afebrile, hypotensive 82/44, ST 104, tachypneic, normal CBG, and normal room air saturations.  Labs significant for WBC 18.8, Hgb 1.8, Hct 7.3, plts 137, Na 133, K 5, bicarb 11, glucose 64, BUN 34, sCr 3.32, AG 19, albumin 1.3. AST 125 (normal ALT), t. Bili 5.9, LA 10.6-> 10.6, INR 2.3, ammonia 82, neg istat hCG quant, UA hazy/amber, large bili, 15 ketones, mod leuks, protein 30, > 50 bacteria, FOBT neg, normal CTH, and EKG showing ST without any acute STE/ STD changes, and CXR showing bilateral opacities suspicious for multifocal pneumonia.  Treated with 2L LR, and started on vancomycin and cefepime after cultures sent.  Repeat Hgb after 2 units 4.2.  Lactic remains elevated and mental status remains lethargic.  PCCM called for admit.  Pertinent  Medical History  Thyroid disease, IDA, uterine fibroids, tobacco and ETOH use (previous care everywhere in [place])  Significant Hospital Events: Including procedures, antibiotic start and stop dates in addition to other pertinent events   7/25: presented w/encephalopathy, Hgb 1.8, LA 10.6, transfused 4u pRBCs 7/26: transfused additional 1u pRBCs  Antibiotics: Vanc 7/25 Cefepime 7/25 Ceftriaxone 7/26> Azithro 7/26>   Interim History / Subjective:  Patient awake, following simple commands this morning Remains on  ventilator  Objective   Blood pressure 92/68, pulse 97, temperature 98.42 F (36.9 C), temperature source Bladder, resp. rate (!) 22, height 5\' 4"  (1.626 m), weight 75.3 kg, SpO2 100 %.    Vent Mode: PRVC FiO2 (%):  [40 %-100 %] 40 % Set Rate:  [15 bmp-18 bmp] 15 bmp Vt Set:  [430 mL] 430 mL PEEP:  [5 cmH20] 5 cmH20 Plateau Pressure:  [14 cmH20-21 cmH20] 14 cmH20   Intake/Output Summary (Last 24 hours) at 06/18/2021 06/20/2021 Last data filed at 06/18/2021 0630 Gross per 24 hour  Intake 3813.25 ml  Output 450 ml  Net 3363.25 ml   Filed Weights   06/17/21 1000 06/18/21 0500  Weight: 81.6 kg 75.3 kg    Examination: General: alert, ill-appearing, NAD HENT: mild scleral icterus, ETT in place Lungs: coarse breath sounds throughout, normal work of breathing Cardiovascular: RRR, normal S1/S2 without m/r/g Abdomen: mild distention but soft, nontender Extremities: 3+ pitting edema in bilateral lower extremities up to knees Neuro: alert, follows commands, moves all extremities equally  Resolved Hospital Problem list   Lactic acidosis Hypotension  Assessment & Plan:  Acute on Chronic Anemia Improved s/p 5u pRBCs. Macrocytic on admission. Etiology unclear- no obvious bleeding P: -GI consulted, appreciate recommendations -Continue PPI, Octreotide for now -Monitor CBC -Check MMA -Transfuse for Hgb <7  Decompensated Liver Cirrhosis Hepatic Encephalopathy EtOH abuse Patient unaware of cirrhosis diagnosis previously. Likely EtOH induced. MELD score 35 on admission (53% 3 month mortality) P: -GI consulted, appreciate recommendations -Check RUQ ultrasound, TSH, echo, hepatitis panel -Continue Lactulose -Continue Thiamine, Folic acid  Multifocal PNA  S/p intubation for airway protection P: -Continue vent support for now -Plan for SBT and anticipate extubation today -Continue Ceftriaxone and azithromycin -f/u culture results  AKI Pre-renal due to hypovolemia and profound anemia  vs hepatorenal syndrome P: -Monitor UOP and BMP  Thrombocytopenia Likely related to advanced cirrhosis P: -Trend CBC -Transfuse if bleeding and <50K  Concern for Seizure There was question of seizure due to encephalopathy and ?L eye deviation on admission. EEG without seizure activity P: -d/c Keppra  Best Practice (right click and "Reselect all SmartList Selections" daily)   Diet/type: NPO w/ meds via tube DVT prophylaxis: SCD GI prophylaxis: PPI Lines: N/A Foley:  Yes, and it is still needed Code Status:  full code Last date of multidisciplinary goals of care discussion [n/a, husband updated at bedside 7/26]  Labs   CBC: Recent Labs  Lab 06/17/21 1015 06/17/21 1647 06/17/21 2003 06/18/21 0223  WBC NO RESULT, LAB ERROR, SEE M31661  18.8*  --   --  14.8*  NEUTROABS Not Measured  14.4*  --   --   --   HGB NO RESULT, LAB ERROR, SEE M31661  1.8* 4.2* 6.1* 6.0*  HCT Not Measured  7.3* 14.1* 18.0* 15.1*  MCV Not Measured  125.9*  --   --  89.9  PLT Not Measured  137*  --   --  97*    Basic Metabolic Panel: Recent Labs  Lab 06/17/21 1015 06/17/21 2003 06/18/21 0223  NA 133* 139 134*  K 5.0 4.1 3.9  CL 103  --  105  CO2 11*  --  20*  GLUCOSE 64*  --  131*  BUN 34*  --  39*  CREATININE 3.32*  --  2.80*  CALCIUM 7.9*  --  7.6*  MG  --   --  1.5*   GFR: Estimated Creatinine Clearance: 27.3 mL/min (A) (by C-G formula based on SCr of 2.8 mg/dL (H)). Recent Labs  Lab 06/17/21 1015 06/17/21 1402 06/18/21 0223  PROCALCITON  --   --  3.98  WBC NO RESULT, LAB ERROR, SEE M31661  18.8*  --  14.8*  LATICACIDVEN 10.6* 10.6*  --     Liver Function Tests: Recent Labs  Lab 06/17/21 1015 06/18/21 0223  AST 125* 122*  121*  ALT 26 31  29   ALKPHOS 117 102  98  BILITOT 5.9* 7.7*  7.5*  PROT 8.0 7.2  7.1  ALBUMIN 1.3* 1.2*  1.2*   No results for input(s): LIPASE, AMYLASE in the last 168 hours. Recent Labs  Lab 06/17/21 1402 06/18/21 0223  AMMONIA  82* 51*    ABG    Component Value Date/Time   PHART 7.370 06/17/2021 2003   PCO2ART 41.4 06/17/2021 2003   PO2ART 503 (H) 06/17/2021 2003   HCO3 24.1 06/17/2021 2003   TCO2 25 06/17/2021 2003   ACIDBASEDEF 1.0 06/17/2021 2003   O2SAT 100.0 06/17/2021 2003     Coagulation Profile: Recent Labs  Lab 06/17/21 1015 06/18/21 0223  INR 2.3* 2.1*    Cardiac Enzymes: No results for input(s): CKTOTAL, CKMB, CKMBINDEX, TROPONINI in the last 168 hours.  HbA1C: No results found for: HGBA1C  CBG: Recent Labs  Lab 06/17/21 1702 06/17/21 1800 06/17/21 1940 06/17/21 2301 06/18/21 0301  GLUCAP 118* 105* 112* 120* 123*    Past Medical History:  She,  has a past medical history of Thyroid disease.   Surgical History:  History reviewed. No pertinent surgical history.   Social History:  Family History:  Her family history is not on file.   Allergies Not on File   Home Medications  Prior to Admission medications   Medication Sig Start Date End Date Taking? Authorizing Provider  ibuprofen (ADVIL) 200 MG tablet Take 400 mg by mouth every 6 (six) hours as needed for headache or mild pain.   Yes [provider]     Critical care time: see attending note      Maury Dus, MD PGY-2 [facility] Family Medicine

## 2021-06-18 NOTE — Procedures (Signed)
Extubation Procedure Note  Patient Details:   Name: Sanah Kraska DOB: [birth year] MRN: [id number]   Airway Documentation:    Vent end date: 06/18/21 Vent end time: 1605   Evaluation  O2 sats: stable throughout Complications: No apparent complications Patient did tolerate procedure well. Bilateral Breath Sounds: Clear, Diminished   Yes  RT extubated patient to 3L [place] per MD order with RN at bedside. Positive cuff leak noted. Patient tolerated well. No stridor noted at this time. RT will continue to monitor as needed.   Jaquelyn Bitter 06/18/2021, 4:11 PM

## 2021-06-18 NOTE — Progress Notes (Signed)
LTM EEG discontinued by EEG tech DH - no skin breakdown at [facility].

## 2021-06-18 NOTE — Consult Note (Addendum)
[facility] Consult: 10:51 AM 06/18/2021  LOS: 1 day    Referring Provider: Dr Lake Bells.    Primary Care Physician:  Pcp, No Primary Gastroenterologist:  unassigned Previous care at [facility] related facilities in [place].   Reason for Consultation: Anemia.  Cirrhosis.  Jaundice.  Alcoholism.   HPI: Michelle Little is a 35 y.o. female.  Patient is homeless.  PMH alcoholism.  Migraine headaches.  Hypothyroidism.  Anxiety.  Severe hyperemesis gravidarum.  Uterine fibroid.  Anemia, iron deficient (ferritin 10 in 10/2017) during pregnancy.  Normal vaginal delivery 03/2018.  Telemedicine visit with provider in [facility] 02/2020 regarding anorexia, nausea and vomiting.  Plan was for gastric emptying study (do not see that this was completed) and pt advised to cease drinking alcohol.  H. pylori breath test negative 10/2017.  HCV Ab negative, hepatitis B surface Ag negative 10/2017.  Arrived at ED yesterday morning.  Had been at [employer].  Pt displaying altered mental status.  2-day prodrome of increased drowsiness.  2 weeks or so of leg swelling and jaundice.  Recent decreased p.o. intake.  No bloody or black stools.  No nausea or vomiting. Reported last alcohol consumption was 7/22.  In ED deviated left eye gaze concerning for seizure.  Intubated for airway protection.  BP 90s over 60s, heart rate 107, temp 97.5. Anemic with Hgb 1.8 (10.6 in 03/2018), MCV initially 125.  Platelets 78 K.  WBCs 14.8. T bili 7.7.  Alk phos 102.  AST/ALT 122/31.  AKI, BUN/creatinine with creatinine as high as 3.3.  BUN is high as 40.  GFR 22.  Elevated Lactic acid.  Ammonia elevated 82. Iron 41, ferritin 52.  Low TIBC.  Low folate.  Above normal B12. INR 2.3 ..  2.1 FOBT negative. CT chest/abdomen/pelvis: Volume overload,  anasarca with diffuse peritoneal and body wall edema.  Moderate ascites.  Bilateral pleural effusions.  Lung opacities.  Cirrhosis of liver.  Enlarged retroperitoneal lymph nodes versus decompressed loops of bowel difficult to ascertain in absence of contrast.  Nonspecific GB wall thickening.  Diffuse bowel edema probably related to anasarca.  Cardiomegaly and cardiac blood pool hypoattenuation consistent with anemia. CT head unremarkable. CXRs with diffuse bilateral infiltrates/edema.  So far has received 5 PRBCs.  Initiated on Rocephin, Zithromax, folic acid via tube, lactulose, octreotide drip, Protonix 40 IV twice daily She is now on vent wean and expect extubation within the next few hours.  Mental status has improved. Apparatus in place for continuous EEG monitoring  Drinks alcohol.  Smokes cigarettes.  Family history includes hypertension in parents and siblings.  Alcoholism, alcoholic hepatitis in his sister.  Kidney failure, stroke, heart attack in maternal grandparents.   Past Medical History:  Diagnosis Date   Thyroid disease     History reviewed. No pertinent surgical history.  Prior to Admission medications   Medication Sig Start Date End Date Taking? Authorizing Provider  ibuprofen (ADVIL) 200 MG tablet Take 400 mg by mouth every 6 (six) hours as needed for headache or mild pain.   Yes [provider]    Scheduled Meds:  sodium chloride   Intravenous Once   sodium chloride   Intravenous Once   chlorhexidine gluconate (MEDLINE KIT)  15 mL Mouth Rinse BID   Chlorhexidine Gluconate Cloth  6 each Topical Daily   docusate  100 mg Per Tube BID   folic acid  1 mg Per Tube Daily   lactulose  30 g Per Tube BID   mouth rinse  15 mL Mouth Rinse 10 times per day   mupirocin ointment  1 application Nasal BID   pantoprazole (PROTONIX) IV  40 mg Intravenous Q12H   polyethylene glycol  17 g Per Tube Daily   thiamine  500 mg Per Tube Daily   Infusions:  sodium chloride 10  mL/hr at 06/18/21 1000   azithromycin Stopped (06/17/21 2136)   cefTRIAXone (ROCEPHIN)  IV Stopped (06/17/21 2109)   norepinephrine (LEVOPHED) Adult infusion Stopped (06/18/21 0552)   octreotide  (SANDOSTATIN)    IV infusion 50 mcg/hr (06/18/21 1000)   propofol (DIPRIVAN) infusion Stopped (06/17/21 2210)   PRN Meds: fentaNYL (SUBLIMAZE) injection, fentaNYL (SUBLIMAZE) injection   Allergies as of 06/17/2021   (Not on File)    No family history on file.  Social History   Socioeconomic History   Marital status: Single    Spouse name: Not on file   Number of children: Not on file   Years of education: Not on file   Highest education level: Not on file  Occupational History   Not on file  Tobacco Use   Smoking status: Not on file   Smokeless tobacco: Not on file  Substance and Sexual Activity   Alcohol use: Not on file   Drug use: Not on file   Sexual activity: Not on file  Other Topics Concern   Not on file  Social History Narrative   Not on file   Social Determinants of Health   Financial Resource Strain: Not on file  Food Insecurity: Not on file  Transportation Needs: Not on file  Physical Activity: Not on file  Stress: Not on file  Social Connections: Not on file  Intimate Partner Violence: Not on file    REVIEW OF SYSTEMS: As patient is intubated, unable to complete a full review of systems but pertinent items mentioned in HPI.   PHYSICAL EXAM: Vital signs in last 24 hours: Vitals:   06/18/21 0900 06/18/21 1000  BP: 95/62 (!) 94/58  Pulse: 86 85  Resp: 15 18  Temp: 98.24 F (36.8 C) 98.24 F (36.8 C)  SpO2: 100% 100%   Wt Readings from Last 3 Encounters:  06/18/21 75.3 kg    General: Patient looks moderately ill but comfortable, alert on vent. Head: No facial asymmetry or signs of head trauma.  Some facial edema.  Head wrapped in gauze covering EEG monitoring probes. Eyes: Conjunctiva pale.  No scleral icterus. Ears: Not hard of hearing Nose:  No blood at the nares. Mouth: No blood at the mouth.  ETT in place. Neck: No JVD Lungs: No labored breathing on the vent.  No cough. Heart: RRR.  No MRG.  S1, S2 present. Abdomen: Nondistended, nontender.  No fluid wave or succession splash.  Fullness, question palpable spleen, which is nontender in the left upper quadrant..   Rectal: Flexi-Seal in place with watery brown stool visible. Musc/Skeltl: No joint swelling or gross abnormalities.  No sarcopenia. Extremities: Pitting edema in the feet and ankles. Neurologic: Follows commands.  Trying to speak but unable to  do so with ETT in place. Skin: No obvious sores or rashes or suspicious lesions Tattoos: Professional quality tattooed name on her upper right arm. Nodes: No cervical adenopathy Psych: Restraint mittens in place but patient currently not agitated.  Intake/Output from previous day: 07/25 0701 - 07/26 0700 In: 3990.4 [I.V.:528; Blood:1912; IV Piggyback:1550.4] Out: 450 [Urine:450] Intake/Output this shift: Total I/O In: 104.9 [I.V.:104.9] Out: 35 [Urine:35]  LAB RESULTS: Recent Labs    06/17/21 1015 06/17/21 1647 06/17/21 2003 06/18/21 0223 06/18/21 0648 06/18/21 0731  WBC NO RESULT, LAB ERROR, SEE [id number]  18.8*  --   --  14.8*  --   --   HGB NO RESULT, LAB ERROR, SEE [id number]  1.8*   < > 6.1* 6.0* 7.5*  --   HCT Not Measured  7.3*   < > 18.0* 15.1* 20.8*  --   PLT Not Measured  137*  --   --  97*  --  78*   < > = values in this interval not displayed.   BMET Lab Results  Component Value Date   NA 136 06/18/2021   NA 134 (L) 06/18/2021   NA 139 06/17/2021   K 3.9 06/18/2021   K 3.9 06/18/2021   K 4.1 06/17/2021   CL 105 06/18/2021   CL 105 06/18/2021   CL 103 06/17/2021   CO2 21 (L) 06/18/2021   CO2 20 (L) 06/18/2021   CO2 11 (L) 06/17/2021   GLUCOSE 114 (H) 06/18/2021   GLUCOSE 131 (H) 06/18/2021   GLUCOSE 64 (L) 06/17/2021   BUN 40 (H) 06/18/2021   BUN 39 (H) 06/18/2021   BUN 34 (H) 06/17/2021    CREATININE 2.58 (H) 06/18/2021   CREATININE 2.80 (H) 06/18/2021   CREATININE 3.32 (H) 06/17/2021   CALCIUM 7.4 (L) 06/18/2021   CALCIUM 7.6 (L) 06/18/2021   CALCIUM 7.9 (L) 06/17/2021   LFT Recent Labs    06/17/21 1015 06/18/21 0223  PROT 8.0 7.2  7.1  ALBUMIN 1.3* 1.2*  1.2*  AST 125* 122*  121*  ALT 26 31  29   ALKPHOS 117 102  98  BILITOT 5.9* 7.7*  7.5*  BILIDIR  --  3.9*  IBILI  --  3.8*   PT/INR Lab Results  Component Value Date   INR 2.1 (H) 06/18/2021   INR 2.1 (H) 06/18/2021   INR 2.3 (H) 06/17/2021   Hepatitis Panel No results for input(s): HEPBSAG, HCVAB, HEPAIGM, HEPBIGM in the last 72 hours. C-Diff No components found for: CDIFF Lipase  No results found for: LIPASE  Drugs of Abuse  No results found for: LABOPIA, COCAINSCRNUR, LABBENZ, AMPHETMU, THCU, LABBARB   RADIOLOGY STUDIES: CT Head Wo Contrast  Result Date: 06/17/2021 CLINICAL DATA:  Altered mental status EXAM: CT HEAD WITHOUT CONTRAST TECHNIQUE: Contiguous axial images were obtained from the base of the skull through the vertex without intravenous contrast. COMPARISON:  None. FINDINGS: Brain: No evidence of acute infarction, hemorrhage, hydrocephalus, extra-axial collection or mass lesion/mass effect. Vascular: No hyperdense vessel or unexpected calcification. Skull: Normal. Negative for fracture or focal lesion. Sinuses/Orbits: No acute finding. Other: None. IMPRESSION: Normal head CT without contrast for age Electronically Signed   By: Jerilynn Mages.  Shick M.D.   On: 06/17/2021 16:48   DG Chest Port 1 View  Result Date: 06/18/2021 CLINICAL DATA:  Acute respiratory failure.  Hypoxia. EXAM: PORTABLE CHEST 1 VIEW COMPARISON:  06/17/2021. FINDINGS: Endotracheal tube and NG tube in stable position. Stable cardiomegaly. Low lung volumes. Diffuse bilateral pulmonary  infiltrates/edema again noted without interim change. No pleural effusion or pneumothorax. IMPRESSION: 1.  Lines and tubes stable position 2.   Stable cardiomegaly. 3. Low lung volumes. Diffuse bilateral pulmonary infiltrates/edema again noted. No interim change. Electronically Signed   By: Marcello Moores  Register   On: 06/18/2021 07:15   DG CHEST PORT 1 VIEW  Result Date: 06/17/2021 CLINICAL DATA:  Endotracheal tube EXAM: PORTABLE CHEST 1 VIEW COMPARISON:  Same-day chest radiographs FINDINGS: Interval placement of endotracheal tube, tip positioned in the mid trachea. Interval placement of esophagogastric tube, tip and side port below the diaphragm. Cardiomegaly. Bilateral interstitial and heterogeneous airspace opacity. IMPRESSION: 1. Interval placement of endotracheal tube, tip positioned in the mid trachea. 2. Interval placement of esophagogastric tube, tip and side port below the diaphragm. 3. Bilateral interstitial and heterogeneous airspace opacity, consistent with edema and/or infection. 4. Cardiomegaly. Electronically Signed   By: Eddie Candle M.D.   On: 06/17/2021 20:24   DG Chest Port 1 View  Result Date: 06/17/2021 CLINICAL DATA:  Altered mental status Sepsis? EXAM: PORTABLE CHEST 1 VIEW COMPARISON:  None. FINDINGS: Heart size at upper limits of normal. Mild pulmonary vascular congestion. Bilateral perihilar and basilar airspace opacities. IMPRESSION: Bilateral lung opacities suspicious for multifocal pneumonia. Electronically Signed   By: Miachel Roux M.D.   On: 06/17/2021 11:01   DG Abd Portable 1V  Result Date: 06/17/2021 CLINICAL DATA:  Orogastric placement. EXAM: PORTABLE ABDOMEN - 1 VIEW COMPARISON:  None. FINDINGS: Orogastric tube enters the stomach, curves along the greater curvature with its tip in the antrum. IMPRESSION: Orogastric tube tip in the region of the gastric antrum. Electronically Signed   By: Nelson Chimes M.D.   On: 06/17/2021 19:29   EEG adult  Result Date: 06/17/2021 Lora Havens, MD     06/17/2021  9:26 PM Patient Name: Celestine Prim MRN: [id number] Epilepsy Attending: Lora Havens Referring  Physician/Provider: Andres Labrum, PA Date: 06/17/2021 Duration: 23.84mns Patient history: 89yoF with AMS. EEG to evaluate for seizure. Level of alertness:  lethargic AEDs during EEG study: LEV, propofol Technical aspects: This EEG study was done with scalp electrodes positioned according to the 10-20 International system of electrode placement. Electrical activity was acquired at a sampling rate of 500Hz  and reviewed with a high frequency filter of 70Hz  and a low frequency filter of 1Hz . EEG data were recorded continuously and digitally stored. Description: EEG showed continuous generalized 3 to 6 Hz theta-delta slowing. Generalized periodic discharges with triphasic morphology at  1-1.5Hz  were also noted. Hyperventilation and photic stimulation were not performed.   ABNORMALITY - Periodic discharges with triphasic morphology, generalized ( GPDs) - Continuous slow, generalized IMPRESSION: This study showed generalized periodic discharges with triphasic morphology at  1-1.5Hz  which is on the ictal-interictal continuum with low to intermediate potential for seizures. Additionally, there is severe diffuse encephalopathy, nonspecific etiology but likely related to sedation, toxic-metabolic etiology. No seizures  were seen throughout the recording. Priyanka OBarbra Sarks  Overnight EEG with video  Result Date: 06/18/2021 YLora Havens MD     06/18/2021  8:29 AM Patient Name: KJaton EilersMRN: [id number] Attending: PLora HavensReferring Physician/Provider: Dr PZeb ComfortDuration: 06/17/2021 2127 to 06/18/2021 0830  Patient history: 89yoF with AMS. EEG to evaluate for seizure.  Level of alertness:  awake, asleep  AEDs during EEG study: LEV  Technical aspects: This EEG study was done with scalp electrodes positioned according to the 10-20 International system of electrode placement. Electrical activity  was acquired at a sampling rate of 500Hz  and reviewed with a high frequency filter of 70Hz  and a low  frequency filter of 1Hz . EEG data were recorded continuously and digitally stored.  Description: No clear posterior dominant rhythm was seen.  Sleep was characterized by sleep spindles (12 to 14 Hz), maximum frontocentral region. EEG showed continuous generalized 3 to 6 Hz theta-delta slowing. Intermittent generalized periodic discharges with triphasic morphology at  1-1.5Hz  were also noted, more prominent when awake/stimulated. Hyperventilation and photic stimulation were not performed.    ABNORMALITY - Periodic discharges with triphasic morphology, generalized ( GPDs) - Continuous slow, generalized  IMPRESSION: This study showed generalized periodic discharges with triphasic morphology at  1-1.5Hz  which is on the ictal-interictal continuum.  However, the morphology, frequency and reactivity is more commonly seen due to toxic-metabolic encephalopathy. Additionally, there is severe diffuse encephalopathy, nonspecific etiology but likely related to sedation, toxic-metabolic etiology. No seizures  were seen throughout the recording.  Priyanka Barbra Sarks   CT CHEST ABDOMEN PELVIS WO CONTRAST  Result Date: 06/17/2021 CLINICAL DATA:  Sepsis, severe anemia fall EXAM: CT CHEST, ABDOMEN AND PELVIS WITHOUT CONTRAST TECHNIQUE: Multidetector CT imaging of the chest, abdomen and pelvis was performed following the standard protocol without IV contrast. COMPARISON:  None. FINDINGS: CT CHEST FINDINGS Cardiovascular: Limited evaluation in the absence of contrast media. Hypoattenuation of the cardiac blood pool compatible with reported anemia. Cardiomegaly. No pericardial effusion. Central pulmonary arteries are normal caliber. Normal caliber aorta. Luminal assessment of the vasculature precluded in the absence of contrast media. Mediastinum/Nodes: Edematous changes noted throughout the mediastinum without free mediastinal fluid or gas. Normal thyroid gland and thoracic inlet. No acute abnormality of the trachea or esophagus. No  worrisome mediastinal or axillary adenopathy. Hilar nodal evaluation is limited in the absence of intravenous contrast media. Lungs/Pleura: Right pleural effusion trace left effusion. There are perihilar predominant areas of patchy and ground-glass opacity with additional dependent consolidative density in the left lung base as well. No pneumothorax. Slight asymmetry of the chest wall some which may be attributable to atelectatic volume loss. Musculoskeletal: Asymmetric chest wall. No acute osseous abnormality or suspicious osseous lesion. Diffuse body wall edema. Relative paucity of subcutaneous fat noted incidentally. CT ABDOMEN PELVIS FINDINGS Hepatobiliary: Enlarged, hypoattenuating and slightly heterogeneous liver with a nodular surface contour and perihepatic ascites. No focal liver lesion. Gallbladder contains a partially calcified gallstones towards the gallbladder neck. Mild wall thickening nonspecific in the setting of presumed liver disease and ascites. Pancreas: No pancreatic ductal dilatation or surrounding inflammatory changes. Spleen: Splenomegaly.  No concerning focal splenic lesion. Adrenals/Urinary Tract: No focal adrenal nodule or mass. Bilateral nonspecific perinephric stranding. No visible or contour deforming renal lesion. Nonobstructing calculi present in the bilateral lower poles measuring up to 3 mm in the right and 2 mm on the left. No obstructive urolithiasis or hydronephrosis. Mild circumferential bladder wall thickening noted, possibly related to underdistention. Stomach/Bowel: Distal esophagus, stomach and duodenum are unremarkable. Diffusely edematous appearance of the large and small bowel. No evidence of obstruction. Appendix in the right lower quadrant without focal appendiceal inflammation. Vascular/Lymphatic: Limited assessment of the vasculature in the absence of contrast media. Suspect some degree of upper abdominal venous collateralization diffuse edematous changes throughout  the mesentery in abdomen limiting assessment lymph nodes. Edematous central mesenteric nodes are present. Question an enlarged node versus decompressed loop of bowel near the level of the aortic bifurcation (3/81) measuring up to 16 mm short axis. Reproductive: No concerning adnexal masses. Few  benign calcification associated with what appears to be the right ovary in the right adnexa. Rounded peripheral calcification of a probable intramural fibroid in the deep pelvis measuring 5.4 cm in size (3/98). Other: Diffuse circumferential body wall edema extensive edematous changes throughout the mesentery as well as moderate volume ascites. No bowel containing hernia. Musculoskeletal: No acute osseous abnormality or suspicious osseous lesion. Included portions of the forearms included within the level of imaging are unremarkable. IMPRESSION: Features of diffuse volume overload/anasarca with mediastinal, peritoneal and diffuse body wall edematous changes moderate volume ascites and layering bilateral effusions as well, right greater than left. Perihilar predominant patchy and ground-glass opacities in both lungs, may reflect developing alveolar edema, infection, ARDS or some combination there of. Stigmata of cirrhosis/intrinsic liver disease with a heterogeneous, hypoattenuating and nodular liver. Correlate with patient history and LFTs. Question several enlarged retroperitoneal lymph nodes versus decompressed bowel loops towards the level of the I ordered bifurcation. Difficult to fully ascertain in the absence of contrast media. Mild gallbladder wall thickening is nonspecific in the setting of presumed liver disease. Could correlate for focal symptoms. The diffusely edematous appearance of the bowel favored to be related to the process of anasarca rather than enterocolitis though could correlate for clinical symptoms. Cardiomegaly. Hypoattenuation of the cardiac blood pool compatible with anemia. Electronically Signed    By: Lovena Le M.D.   On: 06/17/2021 19:26     IMPRESSION:   Decompensated liver disease with cirrhosis and suspected alcoholic hepatitis.     AKI.  Cannot rule out HRS.  Creatinine is improving.     Profound, macrocytic anemia.  Folate deficient.  FOBT negative          Hgb 1.8 >> 5 PRBCs >> 7.5.       Coagulopathy.     Thrombocytopenia.     AMS, suspect multifactorial including hepatic encephalopathy.  Head CT unremarkable.  Ammonia level elevated at 82.  Started on lactulose via tube and measuring 51 today     Intubated for airway protection.  Remains on vent.  X-ray demonstrating pulmonary infiltrates and edema.  2D echocardiogram completed, awaiting reading.     ? Seizure.  EEG monitoring in process.  CCM Dr. Lake Bells does not suspect seizures.    PLAN:        Given FOBT negative stool and no indications of portal hypertensive gastropathy or varices on CT, I discontinued octreotide but will let the current bag run for the next 3 or 4 hours until it is finished.     Ultrasound abdomen is ordered.     Urine sodium.     Need for renal consult??  Her creatinine is significantly improved so could probably hold off on this and reserve for failure to resolve AKI.     Azucena Freed  06/18/2021, 10:51 AM Phone [phone]     Attending physician's note   I have reviewed the chart and examined the patient. I agree with the Advanced Practitioner's note, impression and recommendations.  Could not take any history since she was intubated when I saw her.  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 MELD-Na 33 (s/o 65% 90 day mortality). MDF 52 Profound anemia with heme neg stools. Adm Hb 1.8 s/p 5U to 7.5 Coagulopathy Splenomegaly with thrombocytopenia AKI -likely has HRS- Moderate ascites with generalized anasarca and bilateral pleural effusions and pulmonary edema AMS d/t hepatic  encephalopathy/?EtOH seizures Malnutrition with  folate deficiency Suspected CAP on ceftriaxone and azithromycin  Plan: -Ascitic fluid tap to R/O SBP -Intensive lactulose therapy thru NG is already being done.  Trend ammonia. -Renal consult.  Check urinary Na. -IV protonix -IV norepinephrine for HRS -Hold off on empiric steroids d/t suspected CAP -IV albumin -Vit K -Watch for EtOH withdrawal. -Not a candidate for liver transplant d/t ongoing alcohol abuse (last drink 7/22). -EGD only if active bleeding or near discharge to eval EV. -Poor prognosis.    Carmell Austria, MD [facility] [phone]

## 2021-06-18 NOTE — Progress Notes (Signed)
eLink Physician-Brief Progress Note Patient Name: Michelle Little DOB: 01/13/1986 MRN: [id number]   Date of Service  06/18/2021  HPI/Events of Note  Patient extubated earlier today and has passed bedside swallow study. Nursing request for clear liquid diet.   eICU Interventions  Will advance diet to clear liquids as tolerated.     Intervention Category Major Interventions: Other:  Lenell Antu 06/18/2021, 10:52 PM

## 2021-06-18 NOTE — Progress Notes (Addendum)
eLink Physician-Brief Progress Note Patient Name: Michelle Little DOB: 1986-09-11 MRN: [id number]   Date of Service  06/18/2021  HPI/Events of Note  RN requests soft bilateral wrist restraints to help maintain patient safety.   eICU Interventions  Order entered as above.   ADDENDUM 06/18/21 3:30 AM  - Also ordered an additional unit of blood for transfusion since her repeat Hgb was 6.0 after completion of the 4th unit.  Intervention Category Minor Interventions: Agitation / anxiety - evaluation and management  Marveen Reeks Jaliah Foody 06/18/2021, 3:26 AM

## 2021-06-18 NOTE — Procedures (Addendum)
Patient Name: Michelle Little  MRN: [id number]  Epilepsy Attending: Charlsie Quest  Referring Physician/Provider: Dr Lindie Spruce Duration: 06/17/2021 2127 to 06/18/2021 1142   Patient history: 35yo F with AMS. EEG to evaluate for seizure.    Level of alertness:  awake, asleep   AEDs during EEG study: LEV   Technical aspects: This EEG study was done with scalp electrodes positioned according to the 10-20 International system of electrode placement. Electrical activity was acquired at a sampling rate of 500Hz  and reviewed with a high frequency filter of 70Hz  and a low frequency filter of 1Hz . EEG data were recorded continuously and digitally stored.   Description: No clear posterior dominant rhythm was seen.  Sleep was characterized by sleep spindles (12 to 14 Hz), maximum frontocentral region. EEG showed continuous generalized 3 to 6 Hz theta-delta slowing. Intermittent generalized periodic discharges with triphasic morphology at  1-1.5Hz  were also noted, more prominent when awake/stimulated. Hyperventilation and photic stimulation were not performed.      ABNORMALITY - Periodic discharges with triphasic morphology, generalized ( GPDs) - Continuous slow, generalized   IMPRESSION: This study showed generalized periodic discharges with triphasic morphology at  1-1.5Hz  which is on the ictal-interictal continuum.  However, the morphology, frequency and reactivity is more commonly seen due to toxic-metabolic encephalopathy. Additionally, there is severe diffuse encephalopathy, nonspecific etiology but likely related to sedation, toxic-metabolic etiology. No seizures  were seen throughout the recording.   Stefannie Defeo 

## 2021-06-18 NOTE — Progress Notes (Signed)
  Echocardiogram 2D Echocardiogram has been performed.  Michelle Little 06/18/2021, 2:50 PM

## 2021-06-19 DIAGNOSIS — R188 Other ascites: Secondary | ICD-10-CM

## 2021-06-19 DIAGNOSIS — K7682 Hepatic encephalopathy: Secondary | ICD-10-CM

## 2021-06-19 DIAGNOSIS — D649 Anemia, unspecified: Secondary | ICD-10-CM

## 2021-06-19 DIAGNOSIS — K7290 Hepatic failure, unspecified without coma: Secondary | ICD-10-CM

## 2021-06-19 DIAGNOSIS — N179 Acute kidney failure, unspecified: Secondary | ICD-10-CM

## 2021-06-19 LAB — PATHOLOGIST SMEAR REVIEW

## 2021-06-19 LAB — BPAM RBC
Blood Product Expiration Date: 202208122359
Blood Product Expiration Date: 202208122359
Blood Product Expiration Date: 202208122359
Blood Product Expiration Date: 202208132359
Blood Product Expiration Date: 202208142359
ISSUE DATE / TIME: 202207251154
ISSUE DATE / TIME: 202207251404
ISSUE DATE / TIME: 202207251720
ISSUE DATE / TIME: 202207252058
ISSUE DATE / TIME: 202207260345
Unit Type and Rh: 7300
Unit Type and Rh: 7300
Unit Type and Rh: 7300
Unit Type and Rh: 7300
Unit Type and Rh: 7300

## 2021-06-19 LAB — TYPE AND SCREEN
ABO/RH(D): B POS
Antibody Screen: NEGATIVE
Unit division: 0
Unit division: 0
Unit division: 0
Unit division: 0
Unit division: 0

## 2021-06-19 LAB — COMPREHENSIVE METABOLIC PANEL
ALT: 32 U/L (ref 0–44)
AST: 119 U/L — ABNORMAL HIGH (ref 15–41)
Albumin: 1.6 g/dL — ABNORMAL LOW (ref 3.5–5.0)
Alkaline Phosphatase: 90 U/L (ref 38–126)
Anion gap: 9 (ref 5–15)
BUN: 38 mg/dL — ABNORMAL HIGH (ref 6–20)
CO2: 20 mmol/L — ABNORMAL LOW (ref 22–32)
Calcium: 7.6 mg/dL — ABNORMAL LOW (ref 8.9–10.3)
Chloride: 107 mmol/L (ref 98–111)
Creatinine, Ser: 2.02 mg/dL — ABNORMAL HIGH (ref 0.44–1.00)
GFR, Estimated: 32 mL/min — ABNORMAL LOW (ref >60)
Glucose, Bld: 110 mg/dL — ABNORMAL HIGH (ref 70–99)
Potassium: 3.5 mmol/L (ref 3.5–5.1)
Sodium: 136 mmol/L (ref 135–145)
Total Bilirubin: 8.7 mg/dL — ABNORMAL HIGH (ref 0.3–1.2)
Total Protein: 7.4 g/dL (ref 6.5–8.1)

## 2021-06-19 LAB — URINE CULTURE: Culture: NO GROWTH

## 2021-06-19 LAB — BODY FLUID CELL COUNT WITH DIFFERENTIAL
Eos, Fluid: 0 %
Lymphs, Fluid: 22 %
Monocyte-Macrophage-Serous Fluid: 26 % — ABNORMAL LOW (ref 50–90)
Neutrophil Count, Fluid: 52 % — ABNORMAL HIGH (ref 0–25)
Total Nucleated Cell Count, Fluid: 190 cu mm (ref 0–1000)

## 2021-06-19 LAB — CBC
HCT: 19.3 % — ABNORMAL LOW (ref 36.0–46.0)
HCT: 21.6 % — ABNORMAL LOW (ref 36.0–46.0)
Hemoglobin: 7 g/dL — ABNORMAL LOW (ref 12.0–15.0)
Hemoglobin: 7.8 g/dL — ABNORMAL LOW (ref 12.0–15.0)
MCH: 32.9 pg (ref 26.0–34.0)
MCH: 33.8 pg (ref 26.0–34.0)
MCHC: 36.3 g/dL — ABNORMAL HIGH (ref 30.0–36.0)
MCHC: 36.1 g/dL — ABNORMAL HIGH (ref 30.0–36.0)
MCV: 90.6 fL (ref 80.0–100.0)
MCV: 93.5 fL (ref 80.0–100.0)
Platelets: 76 10*3/uL — ABNORMAL LOW (ref 150–400)
Platelets: 76 10*3/uL — ABNORMAL LOW (ref 150–400)
RBC: 2.13 MIL/uL — ABNORMAL LOW (ref 3.87–5.11)
RBC: 2.31 MIL/uL — ABNORMAL LOW (ref 3.87–5.11)
RDW: 23.7 % — ABNORMAL HIGH (ref 11.5–15.5)
RDW: 24.5 % — ABNORMAL HIGH (ref 11.5–15.5)
WBC: 13.4 10*3/uL — ABNORMAL HIGH (ref 4.0–10.5)
WBC: 14.5 10*3/uL — ABNORMAL HIGH (ref 4.0–10.5)
nRBC: 7.8 % — ABNORMAL HIGH (ref 0.0–0.2)
nRBC: 8.7 % — ABNORMAL HIGH (ref 0.0–0.2)

## 2021-06-19 LAB — PROTEIN, PLEURAL OR PERITONEAL FLUID: Total protein, fluid: <3 g/dL

## 2021-06-19 LAB — ALBUMIN, PLEURAL OR PERITONEAL FLUID: Albumin, Fluid: <1 g/dL

## 2021-06-19 LAB — GLUCOSE, CAPILLARY
Glucose-Capillary: 115 mg/dL — ABNORMAL HIGH (ref 70–99)
Glucose-Capillary: 136 mg/dL — ABNORMAL HIGH (ref 70–99)
Glucose-Capillary: 116 mg/dL — ABNORMAL HIGH (ref 70–99)
Glucose-Capillary: 115 mg/dL — ABNORMAL HIGH (ref 70–99)
Glucose-Capillary: 103 mg/dL — ABNORMAL HIGH (ref 70–99)

## 2021-06-19 LAB — CALCIUM, IONIZED: Calcium, Ionized, Serum: 4.3 mg/dL — ABNORMAL LOW (ref 4.5–5.6)

## 2021-06-19 LAB — HEMOGLOBIN AND HEMATOCRIT, BLOOD
HCT: 21.9 % — ABNORMAL LOW (ref 36.0–46.0)
Hemoglobin: 7.6 g/dL — ABNORMAL LOW (ref 12.0–15.0)

## 2021-06-19 LAB — MAGNESIUM: Magnesium: 2.2 mg/dL (ref 1.7–2.4)

## 2021-06-19 LAB — PROCALCITONIN: Procalcitonin: 2.44 ng/mL

## 2021-06-19 LAB — PHOSPHORUS: Phosphorus: 2.6 mg/dL (ref 2.5–4.6)

## 2021-06-19 MED ORDER — THIAMINE HCL 100 MG PO TABS
100.0000 mg | ORAL_TABLET | Freq: Every day | ORAL | Status: DC
  Administered 2021-06-20 – 2021-06-30 (×11): 100 mg via ORAL
  Filled 2021-06-19 (×12): qty 1

## 2021-06-19 MED ORDER — LORAZEPAM 1 MG PO TABS
1.0000 mg | ORAL_TABLET | ORAL | Status: DC | PRN
  Administered 2021-06-19: 2 mg via ORAL
  Filled 2021-06-19: qty 2

## 2021-06-19 MED ORDER — POTASSIUM CHLORIDE CRYS ER 20 MEQ PO TBCR
40.0000 meq | EXTENDED_RELEASE_TABLET | Freq: Once | ORAL | Status: AC
  Administered 2021-06-19: 40 meq via ORAL
  Filled 2021-06-19: qty 2

## 2021-06-19 MED ORDER — ADULT MULTIVITAMIN W/MINERALS CH
1.0000 | ORAL_TABLET | Freq: Every day | ORAL | Status: DC
  Administered 2021-06-20 – 2021-06-30 (×12): 1 via ORAL
  Filled 2021-06-19 (×12): qty 1

## 2021-06-19 MED ORDER — DOXYCYCLINE HYCLATE 100 MG PO TABS
100.0000 mg | ORAL_TABLET | Freq: Two times a day (BID) | ORAL | Status: AC
  Administered 2021-06-19 – 2021-06-21 (×6): 100 mg via ORAL
  Filled 2021-06-19 (×6): qty 1

## 2021-06-19 MED ORDER — MAGNESIUM SULFATE 4 GM/100ML IV SOLN
4.0000 g | Freq: Once | INTRAVENOUS | Status: AC
  Administered 2021-06-19: 4 g via INTRAVENOUS
  Filled 2021-06-19: qty 100

## 2021-06-19 MED ORDER — THIAMINE HCL 100 MG/ML IJ SOLN
100.0000 mg | Freq: Every day | INTRAMUSCULAR | Status: DC
  Administered 2021-06-23: 100 mg via INTRAVENOUS
  Filled 2021-06-19 (×4): qty 2

## 2021-06-19 MED ORDER — THIAMINE HCL 100 MG PO TABS
100.0000 mg | ORAL_TABLET | Freq: Every day | ORAL | Status: DC
  Administered 2021-06-19: 100 mg via ORAL
  Filled 2021-06-19: qty 1

## 2021-06-19 MED ORDER — FOLIC ACID 1 MG PO TABS
1.0000 mg | ORAL_TABLET | Freq: Every day | ORAL | Status: DC
  Administered 2021-06-20 – 2021-06-30 (×11): 1 mg via ORAL
  Filled 2021-06-19 (×12): qty 1

## 2021-06-19 MED ORDER — LORAZEPAM 2 MG/ML IJ SOLN
1.0000 mg | INTRAMUSCULAR | Status: DC | PRN
Start: 2021-06-19 — End: 2021-06-21

## 2021-06-19 MED ORDER — POTASSIUM CHLORIDE 10 MEQ/100ML IV SOLN: 10.0000 meq | INTRAVENOUS | Status: DC

## 2021-06-19 MED ORDER — LACTULOSE 10 GM/15ML PO SOLN
20.0000 g | Freq: Two times a day (BID) | ORAL | Status: DC
  Administered 2021-06-19 – 2021-06-21 (×4): 20 g via ORAL
  Filled 2021-06-19 (×4): qty 30

## 2021-06-19 NOTE — Progress Notes (Signed)
Pharmacy Electrolyte Replacement  Recent Labs:  Recent Labs    06/18/21 0223 06/18/21 0926 06/19/21 0456  K 3.9   < > 3.5  MG 1.5*  --   --   CREATININE 2.80*   < > 2.02*   < > = values in this interval not displayed.    Low Critical Values (K </= 2.5, Phos </= 1, Mg </= 1) Present: None  MD Contacted: n/a as no critical values  Plan: Mag 4g IV x1 to replace value of 1.5 from yesterday evening (no Magnesium has been given). KCl po X1 (patient passed swallow).   Repeat K and Mg in AM  Link Snuffer, PharmD, BCPS, BCCCP Clinical Pharmacist Please refer to [facility] for [facility] numbers 06/19/2021, 8:00 AM

## 2021-06-19 NOTE — Progress Notes (Signed)
Inpatient Rehab Admissions Coordinator Note:   Per therapy recommendations, pt was screened for CIR candidacy by Kalesha Irving, MS CCC-SLP. At this time, Pt. Appears to have functional decline and is a good candidate for CIR. Will request order for rehab consult per protocol.  Please contact me with questions.   Frimet Durfee, MS, CCC-SLP Rehab Admissions Coordinator  [phone] (celll) [phone] (office)  

## 2021-06-19 NOTE — Evaluation (Signed)
Clinical/Bedside Swallow Evaluation Patient Details  Name: Michelle Little MRN: [id number] Date of Birth: 01/09/1986  Today's Date: 06/19/2021 Time: SLP Start Time (ACUTE ONLY): 0910 SLP Stop Time (ACUTE ONLY): 0926 SLP Time Calculation (min) (ACUTE ONLY): 16 min  Past Medical History:  Past Medical History:  Diagnosis Date   Thyroid disease    Past Surgical History: History reviewed. No pertinent surgical history. HPI:  Pt is a 35 y.o. homeless female who presented on 7/25 with decreased responsiveness, poor p.o. intake, jaundice and BLE swelling. Patient admitted with acute hepatic encephalopathy/hyperammonemia, decompensated liver failure, suspected seizure. CT with findings of cirrhosis with ascites. Intubated for airway protection 7/25-7/26. CXR 7/26: Diffuse bilateral pulmonary infiltrates/edema. EEG 7/26: generalized periodic discharges with triphasic morphology at  1-1.5Hz  which is on the ictal-interictal continuum. PMH: alcoholism, migraine headaches, hypothyroidism, anxiety, severe hyperemesis gravidarum, uterine fibroid,  anemia, iron deficient (ferritin 10 in 10/2017) during pregnancy.   Assessment / Plan / Recommendation Clinical Impression  Pt was seen for bedside swallow evaluation and she denied a history of dysphagia. Oral mechanism exam was WNL and she presented with adequate, natural dentition. She tolerated all solids and liquids without signs or symptoms of oropharyngeal dysphagia even when she was challenged with large boluses and consecutive swallows. A regular texture diet with thin liquids is recommended at this time and further skilled SLP services are not clinically indicated for swallowing. SLP Visit Diagnosis: Dysphagia, unspecified (R13.10)    Aspiration Risk  No limitations    Diet Recommendation Regular;Thin liquid   Liquid Administration via: Cup;Straw Medication Administration: Whole meds with liquid Supervision: Patient able to self feed Postural  Changes: Seated upright at 90 degrees    Other  Recommendations Oral Care Recommendations: Oral care BID   Follow up Recommendations None      Frequency and Duration            Prognosis        Swallow Study   General Date of Onset: 06/18/21 HPI: Pt is a 35 y.o. homeless female who presented on 7/25 with decreased responsiveness, poor p.o. intake, jaundice and BLE swelling. Patient admitted with acute hepatic encephalopathy/hyperammonemia, decompensated liver failure, suspected seizure. CT with findings of cirrhosis with ascites. Intubated for airway protection 7/25-7/26. CXR 7/26: Diffuse bilateral pulmonary infiltrates/edema. EEG 7/26: generalized periodic discharges with triphasic morphology at  1-1.5Hz  which is on the ictal-interictal continuum. PMH: alcoholism, migraine headaches, hypothyroidism, anxiety, severe hyperemesis gravidarum, uterine fibroid,  anemia, iron deficient (ferritin 10 in 10/2017) during pregnancy. Type of Study: Bedside Swallow Evaluation Previous Swallow Assessment: none Diet Prior to this Study: Thin liquids (clear liquids) Temperature Spikes Noted: No Respiratory Status: Room air History of Recent Intubation: Yes Length of Intubations (days): 1 days Date extubated: 06/18/21 Behavior/Cognition: Alert;Cooperative (mildly agitated intermittently) Oral Cavity Assessment: Within Functional Limits Oral Care Completed by SLP: No Oral Cavity - Dentition: Adequate natural dentition Vision: Functional for self-feeding Self-Feeding Abilities: Able to feed self Patient Positioning: Upright in chair;Postural control adequate for testing Baseline Vocal Quality: Low vocal intensity Volitional Cough: Strong;Cognitively unable to elicit Volitional Swallow: Able to elicit    Oral/Motor/Sensory Function Overall Oral Motor/Sensory Function: Within functional limits   Ice Chips Ice chips: Within functional limits Presentation: Spoon   Thin Liquid Thin Liquid: Within  functional limits Presentation: Straw    Nectar Thick Nectar Thick Liquid: Not tested   Honey Thick Honey Thick Liquid: Not tested   Puree Puree: Within functional limits Presentation: Spoon   Solid  Solid: Within functional limits Presentation: Self Fed     Michelle Giammona [name], MS, CCC-SLP Acute Rehabilitation Services Office number [phone] Pager [phone]   [name] 06/19/2021,9:26 AM

## 2021-06-19 NOTE — Progress Notes (Signed)
   06/19/21 1920  Assess: MEWS Score  Temp 98.3 F (36.8 C)  BP 103/77  Pulse Rate 100  ECG Heart Rate (!) 104  Resp (!) 23  Level of Consciousness Alert  SpO2 96 %  O2 Device Room Air  Patient Activity (if Appropriate) In bed  Assess: MEWS Score  MEWS Temp 0  MEWS Systolic 0  MEWS Pulse 1  MEWS RR 1  MEWS LOC 0  MEWS Score 2  MEWS Score Color Yellow  Assess: if the MEWS score is Yellow or Red  Were vital signs taken at a resting state? Yes  Focused Assessment No change from prior assessment  Early Detection of Sepsis Score *See Row Information* Low  MEWS guidelines implemented *See Row Information* Yes  Treat  Pain Scale 0-10  Pain Score 0  Neuro symptoms relieved by Rest  Take Vital Signs  Increase Vital Sign Frequency  Yellow: Q 2hr X 2 then Q 4hr X 2, if remains yellow, continue Q 4hrs  Escalate  MEWS: Escalate Yellow: discuss with charge nurse/RN and consider discussing with provider and RRT  Notify: Charge Nurse/RN  Name of Charge Nurse/RN Notified Rhae Hammock, RN  Date Charge Nurse/RN Notified 06/19/21  Time Charge Nurse/RN Notified 1920  Document  Patient Outcome Other (Comment) (on unit)  Progress note created (see row info) Yes

## 2021-06-19 NOTE — Progress Notes (Signed)
eLink Physician-Brief Progress Note Patient Name: Michelle Little DOB: [birth year] MRN: [id number]   Date of Service  06/19/2021  HPI/Events of Note  Tremors - CIWA = 14. Nursing request for CIWA orders.   eICU Interventions  Plan: Stepdown CIWA orders per protocol.     Intervention Category Major Interventions: Delirium, psychosis, severe agitation - evaluation and management  [name] 06/19/2021, 9:16 PM

## 2021-06-19 NOTE — Procedures (Addendum)
Paracentesis Procedure Note  Michelle Little  [id number]  [birth year]  Date:06/19/21  Time:5:43 PM   Provider Performing:Idamay Hosein D Anselm Lis; Posey Boyer NP   Procedure: Paracentesis with imaging guidance ([id number])  Indication(s) Ascites  Consent Risks of the procedure as well as the alternatives and risks of each were explained to the patient and/or caregiver.  Consent for the procedure was obtained and is signed in the bedside chart  Anesthesia Topical only with 1% lidocaine    Time Out Verified patient identification, verified procedure, site/side was marked, verified correct patient position, special equipment/implants available, medications/allergies/relevant history reviewed, required imaging and test results available.   Sterile Technique Maximal sterile technique including full sterile barrier drape, hand hygiene, sterile gown, sterile gloves, mask, hair covering, sterile ultrasound probe cover (if used).   Procedure Description Ultrasound used to identify appropriate peritoneal anatomy for placement and overlying skin marked.  Area of drainage cleaned and draped in sterile fashion. Lidocaine was used to anesthetize the skin and subcutaneous tissue.  100 cc's of yellow appearing fluid was drained. Catheter then removed and bandaid applied to site.   Complications/Tolerance None; patient tolerated the procedure well.   EBL Minimal   Specimen(s) Peritoneal fluid  JD Anselm Lis [facility] 06/19/2021, 5:44 PM  Please see [email or web address] for pager details.  From 7A-7P if no response, please call [phone]. After hours, please call ELink [phone].

## 2021-06-19 NOTE — Progress Notes (Signed)
   06/19/21 2100  Assess: MEWS Score  BP 114/78  Pulse Rate (!) 109  ECG Heart Rate (!) 109  Resp (!) 36  SpO2 93 %  Assess: MEWS Score  MEWS Temp 0  MEWS Systolic 0  MEWS Pulse 1  MEWS RR 3  MEWS LOC 0  MEWS Score 4  MEWS Score Color Red  Assess: if the MEWS score is Yellow or Red  Were vital signs taken at a resting state? Yes  Focused Assessment No change from prior assessment  Early Detection of Sepsis Score *See Row Information* Low  MEWS guidelines implemented *See Row Information* Yes  Treat  MEWS Interventions Escalated (See documentation below)  Pain Scale 0-10  Pain Score 0  Take Vital Signs  Increase Vital Sign Frequency  Red: Q 1hr X 4 then Q 4hr X 4, if remains red, continue Q 4hrs  Escalate  MEWS: Escalate Red: discuss with charge nurse/RN and provider, consider discussing with RRT  Notify: Charge Nurse/RN  Name of Charge Nurse/RN Notified Rhae Hammock, RN  Date Charge Nurse/RN Notified 06/19/21  Time Charge Nurse/RN Notified 2100  Notify: Provider  Provider Name/Title Elink  Date Provider Notified 06/19/21  Time Provider Notified 2100  Notification Type Call  Notification Reason Change in status  Provider response See new orders  Date of Provider Response 06/19/21  Time of Provider Response 2100  Document  Patient Outcome Other (Comment) (alcohol withdrawal protocol started)  Progress note created (see row info) Yes

## 2021-06-19 NOTE — Progress Notes (Signed)
Attending:    Subjective: Extubated successfully yesterday Wants IV's out and wants to eat this morning Started on albumin by GI medicine  Objective: Vitals:   06/19/21 0730 06/19/21 0800 06/19/21 0830 06/19/21 0900  BP: 91/75 96/67 (!) 107/93 98/82  Pulse: 89 90 95 96  Resp: (!) 28 (!) 28 (!) 23 (!) 25  Temp: 97.88 F (36.6 C) 97.88 F (36.6 C)    TempSrc:  Bladder    SpO2: 99% 100% 99% 97%  Weight:      Height:       Vent Mode: CPAP;PSV FiO2 (%):  [40 %] 40 % PEEP:  [5 cmH20] 5 cmH20 Pressure Support:  [5 cmH20-10 cmH20] 5 cmH20  Intake/Output Summary (Last 24 hours) at 06/19/2021 0919 Last data filed at 06/19/2021 0800 Gross per 24 hour  Intake 1800.14 ml  Output 1830 ml  Net -29.86 ml    General:  Resting comfortably in bed HENT: NCAT OP clear PULM: CTA B, normal effort CV: RRR, no mgr GI: BS+, soft, nontender MSK: normal bulk and tone Neuro: awake, alert, no distress, MAEW    CBC    Component Value Date/Time   WBC 13.4 (H) 06/19/2021 0456   RBC 2.13 (L) 06/19/2021 0456   HGB 7.0 (L) 06/19/2021 0456   HCT 19.3 (L) 06/19/2021 0456   PLT 76 (L) 06/19/2021 0456   MCV 90.6 06/19/2021 0456   MCH 32.9 06/19/2021 0456   MCHC 36.3 (H) 06/19/2021 0456   RDW 23.7 (H) 06/19/2021 0456   LYMPHSABS Not Measured 06/17/2021 1015   LYMPHSABS 2.1 06/17/2021 1015   MONOABS Not Measured 06/17/2021 1015   MONOABS 2.1 (H) 06/17/2021 1015   EOSABS Not Measured 06/17/2021 1015   EOSABS 0.0 06/17/2021 1015   BASOSABS Not Measured 06/17/2021 1015   BASOSABS 0.0 06/17/2021 1015    BMET    Component Value Date/Time   NA 136 06/19/2021 0456   K 3.5 06/19/2021 0456   CL 107 06/19/2021 0456   CO2 20 (L) 06/19/2021 0456   GLUCOSE 110 (H) 06/19/2021 0456   BUN 38 (H) 06/19/2021 0456   CREATININE 2.02 (H) 06/19/2021 0456   CALCIUM 7.6 (L) 06/19/2021 0456   GFRNONAA 32 (L) 06/19/2021 0456   TTE: normal LVEF, RV size/function is OK, trace pericardial effusion RUQ  ultrasound: findings consistent with cirrhosis, reversal of portal flow Micro: Resp culture ngtd Blood cx ngtd  CXR images none new  Impression/Plan: Acute metabolic encephalopathy due to hepatic encephalopathy: continue lactulose, keep K > 4  Decompensated cirrhosis, alcoholic: avoid hepato toxic medications; continue lactulose; needs GI follow up  Macrocytic Anemia without bleeding: f/u methylmelonic acid, transfuse for Hgb < 7gm/dL  AKI: improving, albumin to continue, monitor urine output  CAP: change to doxycycline for 5 days  Hypomangesemia and hypokalemia: replace today  Thrombocytopenia: SCD for DVT prophylaxis, monitor for bleeding  Advance diet  My cc time n/a minutes  Heber Stuart, MD [place] [facility] Pager: [phone] Cell: [phone] After 7pm: [phone]

## 2021-06-19 NOTE — Progress Notes (Signed)
NAME:  Michelle Little, MRN:  [id number], DOB:  1986-02-21, LOS: 2 ADMISSION DATE:  06/17/2021, CONSULTATION DATE:  06/17/21 REFERRING MD:  Effie Shy, CHIEF COMPLAINT:  Severe anemia, encephalopathy   History of Present Illness:  35 year old female with prior history of thyroid disease, IDA, tobacco and ETOH abuse [facility] after her boyfriend could not wake her up this morning.  He had reported that she has been having difficulty walking for the last 2 weeks due to swelling of her legs and has been mostly in the bed, with poor PO intake.  Reports her last ETOH use was 7/23.   Initially in ER, patient afebrile, hypotensive 82/44, ST 104, tachypneic, normal CBG, and normal room air saturations.  Labs significant for WBC 18.8, Hgb 1.8, Hct 7.3, plts 137, Na 133, K 5, bicarb 11, glucose 64, BUN 34, sCr 3.32, AG 19, albumin 1.3. AST 125 (normal ALT), t. Bili 5.9, LA 10.6-> 10.6, INR 2.3, ammonia 82, neg istat hCG quant, UA hazy/amber, large bili, 15 ketones, mod leuks, protein 30, > 50 bacteria, FOBT neg, normal CTH, and EKG showing ST without any acute STE/ STD changes, and CXR showing bilateral opacities suspicious for multifocal pneumonia.  Treated with 2L LR, and started on vancomycin and cefepime after cultures sent.  Repeat Hgb after 2 units 4.2.  Lactic remains elevated and mental status remains lethargic.  PCCM called for admit.  Pertinent  Medical History  Thyroid disease, IDA, uterine fibroids, tobacco and ETOH use (previous care everywhere in [place])  Significant Hospital Events: Including procedures, antibiotic start and stop dates in addition to other pertinent events   7/25: presented w/encephalopathy, Hgb 1.8, LA 10.6, transfused 4u pRBCs 7/26: transfused additional 1u pRBCs, GI consulted 7/26: extubated  Antibiotics: Vanc 7/25 Cefepime 7/25 Ceftriaxone 7/26>7/27 Azithro 7/26>7/27 Doxy 7/27>  Interim History / Subjective:  Patient extubated yesterday  without complication. No acute events overnight. Patient frustrated by her IV this morning, asking repeatedly if it can be removed. Otherwise no complaints.  Objective   Blood pressure 94/68, pulse 89, temperature 98.42 F (36.9 C), resp. rate (!) 21, height 5\' 4"  (1.626 m), weight 72.5 kg, SpO2 100 %.     Intake/Output Summary (Last 24 hours) at 06/19/2021 0748 Last data filed at 06/19/2021 0600 Gross per 24 hour  Intake 1850.08 ml  Output 1765 ml  Net 85.08 ml   Filed Weights   06/17/21 1000 06/18/21 0500 06/19/21 0500  Weight: 81.6 kg 75.3 kg 72.5 kg    Examination: General: alert, NAD HENT: mild scleral icterus Lungs: normal work of breathing, lungs CTAB Cardiovascular: RRR, normal S1/S2 without m/r/g Abdomen: mild distention but soft, nontender Extremities: 3+ pitting edema in bilateral lower extremities Neuro: alert, moves all extremities equally  Resolved Hospital Problem list   Lactic acidosis Encephalopathy Hypotension  Assessment & Plan:  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 Hepatitis panel negative. TSH wnl. RUQ ultrasound showed cirrhosis and portal HTN with reversal of portal blood flow. P: -GI following, appreciate recommendations -Continue Lactulose -Continue daily Thiamine, Folic acid -Needs ongoing alcohol cessation counseling  Macrocytic Anemia Acute on chronic. No signs of bleeding. Etiology unclear- likely multifactorial including iron deficiency, folate deficiency, and ?marrow abnormality. P: -Methylmalonic acid pending -Monitor CBC -Transfuse Hgb <7  Thrombocytopenia Secondary to advanced cirrhosis. Platelets stable at 76 this morning P: -Monitor CBC -Transfuse for bleeding and Plt <50K  AKI Improving. Do not suspect hepatorenal syndrome at this time. P: -Continue  Albumin q6h per GI -Monitor UOP and BMP  CAP P: -Change to doxycycline (5 day total abx course)  Electrolyte Derangements Mag 1.5, K  3.5. P: -Replete -Monitor BMP, mag  Best Practice (right click and "Reselect all SmartList Selections" daily)   Diet/type: Regular consistency (see orders) DVT prophylaxis: SCD GI prophylaxis: PPI Lines: N/A Foley:  N/A Code Status:  full code Last date of multidisciplinary goals of care discussion [n/a]  Labs   CBC: Recent Labs  Lab 06/17/21 1015 06/17/21 1647 06/18/21 0223 06/18/21 0648 06/18/21 0731 06/18/21 1326 06/18/21 2230 06/19/21 0456  WBC NO RESULT, LAB ERROR, SEE [id number]  18.8*  --  14.8*  --   --   --   --  13.4*  NEUTROABS Not Measured  14.4*  --   --   --   --   --   --   --   HGB NO RESULT, LAB ERROR, SEE [id number]  1.8*   < > 6.0* 7.5*  --  7.2* 7.6* 7.0*  HCT Not Measured  7.3*   < > 15.1* 20.8*  --  20.0* 21.9* 19.3*  MCV Not Measured  125.9*  --  89.9  --   --   --   --  90.6  PLT Not Measured  137*  --  97*  --  78*  --   --  76*   < > = values in this interval not displayed.    Basic Metabolic Panel: Recent Labs  Lab 06/17/21 1015 06/17/21 2003 06/18/21 0223 06/18/21 0926 06/19/21 0456  NA 133* 139 134* 136 136  K 5.0 4.1 3.9 3.9 3.5  CL 103  --  105 105 107  CO2 11*  --  20* 21* 20*  GLUCOSE 64*  --  131* 114* 110*  BUN 34*  --  39* 40* 38*  CREATININE 3.32*  --  2.80* 2.58* 2.02*  CALCIUM 7.9*  --  7.6* 7.4* 7.6*  MG  --   --  1.5*  --   --    GFR: Estimated Creatinine Clearance: 37.2 mL/min (A) (by C-G formula based on SCr of 2.02 mg/dL (H)). Recent Labs  Lab 06/17/21 1015 06/17/21 1402 06/18/21 0223 06/18/21 0731 06/19/21 0456  PROCALCITON  --   --  3.98  --  2.44  WBC NO RESULT, LAB ERROR, SEE [id number]  18.8*  --  14.8*  --  13.4*  LATICACIDVEN 10.6* 10.6*  --  1.7  --     Liver Function Tests: Recent Labs  Lab 06/17/21 1015 06/18/21 0223 06/19/21 0456  AST 125* 122*  121* 119*  ALT 26 31  29  32  ALKPHOS 117 102  98 90  BILITOT 5.9* 7.7*  7.5* 8.7*  PROT 8.0 7.2  7.1 7.4  ALBUMIN 1.3* 1.2*  1.2* 1.6*    No results for input(s): LIPASE, AMYLASE in the last 168 hours. Recent Labs  Lab 06/17/21 1402 06/18/21 0223  AMMONIA 82* 51*    ABG    Component Value Date/Time   PHART 7.370 06/17/2021 2003   PCO2ART 41.4 06/17/2021 2003   PO2ART 503 (H) 06/17/2021 2003   HCO3 24.1 06/17/2021 2003   TCO2 25 06/17/2021 2003   ACIDBASEDEF 1.0 06/17/2021 2003   O2SAT 100.0 06/17/2021 2003     Coagulation Profile: Recent Labs  Lab 06/17/21 1015 06/18/21 0223 06/18/21 0731  INR 2.3* 2.1* 2.1*    Cardiac Enzymes: No results for input(s): CKTOTAL, CKMB, CKMBINDEX, TROPONINI in the  last 168 hours.  HbA1C: No results found for: HGBA1C  CBG: Recent Labs  Lab 06/18/21 1511 06/18/21 1900 06/18/21 2302 06/19/21 0304 06/19/21 0743  GLUCAP 113* 104* 129* 115* 136*    Past Medical History:  She,  has a past medical history of Thyroid disease.   Surgical History:  History reviewed. No pertinent surgical history.   Social History:      Family History:  Her family history is not on file.   Allergies Not on File   Home Medications  Prior to Admission medications   Medication Sig Start Date End Date Taking? Authorizing Provider  ibuprofen (ADVIL) 200 MG tablet Take 400 mg by mouth every 6 (six) hours as needed for headache or mild pain.   Yes [provider]     Critical care time: see attending note       Maury Dus, MD PGY-2 [facility]

## 2021-06-19 NOTE — Progress Notes (Addendum)
Daily Rounding Note  06/19/2021, 10:58 AM  LOS: 2 days   SUBJECTIVE:   Chief complaint:   Cirrhosis, jaundice.  Alcoholism.  Anemia. Continues to have medium to light brown liquid stools on lactulose.  A bit more alert but not able to focus very well.  No complaints of nausea or abdominal pain.  Tolerating clears, regular diet ordered and will start at lunch. Off pressors. 1.76 liters of stool recorded yesterday, 100 mL recorded so far today.   OBJECTIVE:         Vital signs in last 24 hours:    Temp:  [97.16 F (36.2 C)-98.42 F (36.9 C)] 97.88 F (36.6 C) (07/27 0800) Pulse Rate:  [80-96] 91 (07/27 1000) Resp:  [12-28] 24 (07/27 1000) BP: (79-107)/(49-93) 107/72 (07/27 1000) SpO2:  [96 %-100 %] 99 % (07/27 1000) FiO2 (%):  [40 %] 40 % (07/26 1600) Weight:  [72.5 kg] 72.5 kg (07/27 0500) Last BM Date: 06/19/21 Filed Weights   06/17/21 1000 06/18/21 0500 06/19/21 0500  Weight: 81.6 kg 75.3 kg 72.5 kg   General: Patient has the blanket covering Little head.  She is sitting on the bedside chair.  Appears comfortable and very disengaged.  Somnolent but arousable. Heart: RRR Chest: Cough or labored breathing.  Lungs clear bilaterally Abdomen: Slightly distended and a bit tight but not tender.  Active normal quality bowel sounds. Extremities: Multiple IV lines in place.  No edema. Neuro/Psych: Oriented to place, time, self.  Laconic.  Requiring repeat verbal cues to stay focused.  Not really following commands as requested.  Did not straighten Little arm out or flex Little wrist adequately to test for asterixis but this does not appear to be present on my repeated flexion of wrist.    Intake/Output from previous day: 07/26 0701 - 07/27 0700 In: 1850.1 [P.O.:720; I.V.:430; IV Piggyback:700.1] Out: 4332 [RJJOA:416; Emesis/NG output:200; Stool:700]  Intake/Output this shift: Total I/O In: 98.4 [I.V.:25.4; IV Piggyback:72.9] Out:  100 [Urine:100]  Lab Results: Recent Labs    06/17/21 1015 06/17/21 1647 06/18/21 0223 06/18/21 0648 06/18/21 0731 06/18/21 1326 06/18/21 2230 06/19/21 0456  WBC NO RESULT, LAB ERROR, SEE [id number]  18.8*  --  14.8*  --   --   --   --  13.4*  HGB NO RESULT, LAB ERROR, SEE [id number]  1.8*   < > 6.0*   < >  --  7.2* 7.6* 7.0*  HCT Not Measured  7.3*   < > 15.1*   < >  --  20.0* 21.9* 19.3*  PLT Not Measured  137*  --  97*  --  78*  --   --  76*   < > = values in this interval not displayed.   BMET Recent Labs    06/18/21 0223 06/18/21 0926 06/19/21 0456  NA 134* 136 136  K 3.9 3.9 3.5  CL 105 105 107  CO2 20* 21* 20*  GLUCOSE 131* 114* 110*  BUN 39* 40* 38*  CREATININE 2.80* 2.58* 2.02*  CALCIUM 7.6* 7.4* 7.6*   LFT Recent Labs    06/17/21 1015 06/18/21 0223 06/19/21 0456  PROT 8.0 7.2  7.1 7.4  ALBUMIN 1.3* 1.2*  1.2* 1.6*  AST 125* 122*  121* 119*  ALT 26 31  29  32  ALKPHOS 117 102  98 90  BILITOT 5.9* 7.7*  7.5* 8.7*  BILIDIR  --  3.9*  --   IBILI  --  3.8*  --    PT/INR Recent Labs    06/18/21 0223 06/18/21 0731  LABPROT 23.2* 23.2*  INR 2.1* 2.1*   Hepatitis Panel Recent Labs    06/18/21 1326  HEPBSAG NON REACTIVE  HCVAB NON REACTIVE  HEPAIGM NON REACTIVE  HEPBIGM NON REACTIVE    Studies/Results: CT Head Wo Contrast  Result Date: 06/17/2021 CLINICAL DATA:  Altered mental status EXAM: CT HEAD WITHOUT CONTRAST TECHNIQUE: Contiguous axial images were obtained from the base of the skull through the vertex without intravenous contrast. COMPARISON:  None. FINDINGS: Brain: No evidence of acute infarction, hemorrhage, hydrocephalus, extra-axial collection or mass lesion/mass effect. Vascular: No hyperdense vessel or unexpected calcification. Skull: Normal. Negative for fracture or focal lesion. Sinuses/Orbits: No acute finding. Other: None. IMPRESSION: Normal head CT without contrast for age Electronically Signed   By: Judie PetitM.  Shick M.D.   On:  06/17/2021 16:48   DG Chest Port 1 View  Result Date: 06/18/2021 CLINICAL DATA:  Acute respiratory failure.  Hypoxia. EXAM: PORTABLE CHEST 1 VIEW COMPARISON:  06/17/2021. FINDINGS: Endotracheal tube and NG tube in stable position. Stable cardiomegaly. Low lung volumes. Diffuse bilateral pulmonary infiltrates/edema again noted without interim change. No pleural effusion or pneumothorax. IMPRESSION: 1.  Lines and tubes stable position 2.  Stable cardiomegaly. 3. Low lung volumes. Diffuse bilateral pulmonary infiltrates/edema again noted. No interim change. Electronically Signed   By: Maisie Fushomas  Register   On: 06/18/2021 07:15   DG CHEST PORT 1 VIEW  Result Date: 06/17/2021 CLINICAL DATA:  Endotracheal tube EXAM: PORTABLE CHEST 1 VIEW COMPARISON:  Same-day chest radiographs FINDINGS: Interval placement of endotracheal tube, tip positioned in the mid trachea. Interval placement of esophagogastric tube, tip and side port below the diaphragm. Cardiomegaly. Bilateral interstitial and heterogeneous airspace opacity. IMPRESSION: 1. Interval placement of endotracheal tube, tip positioned in the mid trachea. 2. Interval placement of esophagogastric tube, tip and side port below the diaphragm. 3. Bilateral interstitial and heterogeneous airspace opacity, consistent with edema and/or infection. 4. Cardiomegaly. Electronically Signed   By: Lauralyn PrimesAlex  Bibbey M.D.   On: 06/17/2021 20:24   DG Abd Portable 1V  Result Date: 06/17/2021 CLINICAL DATA:  Orogastric placement. EXAM: PORTABLE ABDOMEN - 1 VIEW COMPARISON:  None. FINDINGS: Orogastric tube enters the stomach, curves along the greater curvature with its tip in the antrum. IMPRESSION: Orogastric tube tip in the region of the gastric antrum. Electronically Signed   By: Paulina FusiMark  Shogry M.D.   On: 06/17/2021 19:29   EEG adult  Result Date: 06/17/2021 Michelle QuestYadav, Michelle O, MD     06/17/2021  9:26 PM Patient Name: Michelle HerKimmica Little MRN: [id number] Epilepsy Attending: Charlsie QuestPriyanka O Little  Referring Physician/Provider: Pia MauJohn Payne, PA Date: 06/17/2021 Duration: 23.852mins Patient history: 35yo F with AMS. EEG to evaluate for seizure. Level of alertness:  lethargic AEDs during EEG study: LEV, propofol Technical aspects: This EEG study was done with scalp electrodes positioned according to the 10-20 International system of electrode placement. Electrical activity was acquired at a sampling rate of 500Hz  and reviewed with a high frequency filter of 70Hz  and a low frequency filter of 1Hz . EEG data were recorded continuously and digitally stored. Description: EEG showed continuous generalized 3 to 6 Hz theta-delta slowing. Generalized periodic discharges with triphasic morphology at  1-1.5Hz  were also noted. Hyperventilation and photic stimulation were not performed.   ABNORMALITY - Periodic discharges with triphasic morphology, generalized ( GPDs) - Continuous slow, generalized IMPRESSION: This study showed generalized periodic discharges with triphasic morphology at  1-1.5Hz   which is on the ictal-interictal continuum with low to intermediate potential for seizures. Additionally, there is severe diffuse encephalopathy, nonspecific etiology but likely related to sedation, toxic-metabolic etiology. No seizures  were seen throughout the recording. Michelle Annabelle Harman   Overnight EEG with video  Result Date: 06/18/2021 Michelle Quest, MD     06/18/2021  1:52 PM Patient Name: Kanai Hilger MRN: [id number] Epilepsy Attending: Charlsie Little Referring Physician/Provider: Dr Lindie Spruce Duration: 06/17/2021 2127 to 06/18/2021 1142  Patient history: 35yo F with AMS. EEG to evaluate for seizure.  Level of alertness:  awake, asleep  AEDs during EEG study: LEV  Technical aspects: This EEG study was done with scalp electrodes positioned according to the 10-20 International system of electrode placement. Electrical activity was acquired at a sampling rate of  and reviewed with a high frequency filter of   and a low frequency filter of . EEG data were recorded continuously and digitally stored.  Description: No clear posterior dominant rhythm was seen.  Sleep was characterized by sleep spindles (12 to 14 Hz), maximum frontocentral region. EEG showed continuous generalized 3 to 6 Hz theta-delta slowing. Intermittent generalized periodic discharges with triphasic morphology at  1-1.5Hz  were also noted, more prominent when awake/stimulated. Hyperventilation and photic stimulation were not performed.    ABNORMALITY - Periodic discharges with triphasic morphology, generalized ( GPDs) - Continuous slow, generalized  IMPRESSION: This study showed generalized periodic discharges with triphasic morphology at  1-1.5Hz  which is on the ictal-interictal continuum.  However, the morphology, frequency and reactivity is more commonly seen due to toxic-metabolic encephalopathy. Additionally, there is severe diffuse encephalopathy, nonspecific etiology but likely related to sedation, toxic-metabolic etiology. No seizures  were seen throughout the recording.  Michelle Little   ECHOCARDIOGRAM COMPLETE  Result Date: 06/18/2021    ECHOCARDIOGRAM REPORT   Patient Name:   [facility], [place] Date of Exam: 06/18/2021 Medical Rec #:  [id number]      Height:       64.0 in Accession #:    [id number]     Weight:       166.0 lb Date of Birth:  Dec 20, 1985      BSA:          1.807 m Patient Age:    35 years       BP:           95/60 mmHg Patient Gender: F              HR:           83 bpm. Exam Location:  Inpatient Procedure: 2D Echo, Cardiac Doppler and Color Doppler Indications:    Cirrhosis (HCC) [[id number]]  History:        Patient has no prior history of Echocardiogram examinations.  Sonographer:    Eulah Pont RDCS Referring Phys: Dixon Boos DOUGLAS B MCQUAID IMPRESSIONS  1. Left ventricular ejection fraction, by estimation, is 55 to 60%. The left ventricle has normal function. The left ventricle has no regional wall motion abnormalities.  Indeterminate diastolic filling due to E-A fusion.  2. Right ventricular systolic function is normal. The right ventricular size is normal. There is mildly elevated pulmonary artery systolic pressure.  3. Left atrial size was severely dilated.  4. Small pericardial effusion adjacent to the posterolateral myocardium.  5. The mitral valve is normal in structure. Mild mitral valve regurgitation. No evidence of mitral stenosis.  6. The aortic valve is tricuspid. Aortic valve regurgitation is not visualized. No aortic stenosis  is present.  7. The inferior vena cava is dilated in size with <50% respiratory variability, suggesting right atrial pressure of 15 mmHg. FINDINGS  Left Ventricle: Left ventricular ejection fraction, by estimation, is 55 to 60%. The left ventricle has normal function. The left ventricle has no regional wall motion abnormalities. The left ventricular internal cavity size was normal in size. There is  no left ventricular hypertrophy. Indeterminate diastolic filling due to E-A fusion. Right Ventricle: The right ventricular size is normal. No increase in right ventricular wall thickness. Right ventricular systolic function is normal. There is mildly elevated pulmonary artery systolic pressure. The tricuspid regurgitant velocity is 2.49  m/s, and with an assumed right atrial pressure of 15 mmHg, the estimated right ventricular systolic pressure is 39.8 mmHg. Left Atrium: Left atrial size was severely dilated. Right Atrium: Right atrial size was normal in size. Pericardium: Small pericardial effusion adjacent to the posterolateral myocardium. There is no evidence of pericardial effusion. Mitral Valve: The mitral valve is normal in structure. Mild mitral valve regurgitation, with posteriorly-directed jet. No evidence of mitral valve stenosis. Tricuspid Valve: The tricuspid valve is normal in structure. Tricuspid valve regurgitation is mild . No evidence of tricuspid stenosis. Aortic Valve: The aortic  valve is tricuspid. Aortic valve regurgitation is not visualized. No aortic stenosis is present. Pulmonic Valve: The pulmonic valve was normal in structure. Pulmonic valve regurgitation is not visualized. No evidence of pulmonic stenosis. Aorta: The aortic root is normal in size and structure. Venous: The inferior vena cava is dilated in size with less than 50% respiratory variability, suggesting right atrial pressure of 15 mmHg. IAS/Shunts: No atrial level shunt detected by color flow Doppler.  LEFT VENTRICLE PLAX 2D LVIDd:         5.00 cm  Diastology LVIDs:         3.50 cm  LV e' medial:    8.19 cm/s LV PW:         1.10 cm  LV E/e' medial:  13.4 LV IVS:        0.80 cm  LV e' lateral:   9.89 cm/s LVOT diam:     2.10 cm  LV E/e' lateral: 11.1 LV SV:         76 LV SV Index:   42 LVOT Area:     3.46 cm  RIGHT VENTRICLE RV S prime:     11.90 cm/s TAPSE (M-mode): 3.0 cm LEFT ATRIUM             Index       RIGHT ATRIUM           Index LA diam:        4.60 cm 2.55 cm/m  RA Area:     17.40 cm LA Vol (A2C):   83.7 ml 46.31 ml/m RA Volume:   50.70 ml  28.05 ml/m LA Vol (A4C):   83.7 ml 46.31 ml/m LA Biplane Vol: 87.3 ml 48.30 ml/m  AORTIC VALVE LVOT Vmax:   106.00 cm/s LVOT Vmean:  79.100 cm/s LVOT VTI:    0.220 m  AORTA Ao Root diam: 3.20 cm Ao Asc diam:  3.10 cm MITRAL VALVE                TRICUSPID VALVE MV Area (PHT): 4.39 cm     TR Peak grad:   24.8 mmHg MV Decel Time: 173 msec     TR Vmax:        249.00 cm/s MR PISA:  0.57 cm MR PISA Radius: 0.30 cm     SHUNTS MV E velocity: 110.00 cm/s  Systemic VTI:  0.22 m MV A velocity: 90.30 cm/s   Systemic Diam: 2.10 cm MV E/A ratio:  1.22 Chilton Si MD Electronically signed by Chilton Si MD Signature Date/Time: 06/18/2021/5:17:38 PM    Final    CT CHEST ABDOMEN PELVIS WO CONTRAST  Result Date: 06/17/2021 CLINICAL DATA:  Sepsis, severe anemia fall EXAM: CT CHEST, ABDOMEN AND PELVIS WITHOUT CONTRAST TECHNIQUE: Multidetector CT imaging of the chest,  abdomen and pelvis was performed following the standard protocol without IV contrast. COMPARISON:  None. FINDINGS: CT CHEST FINDINGS Cardiovascular: Limited evaluation in the absence of contrast media. Hypoattenuation of the cardiac blood pool compatible with reported anemia. Cardiomegaly. No pericardial effusion. Central pulmonary arteries are normal caliber. Normal caliber aorta. Luminal assessment of the vasculature precluded in the absence of contrast media. Mediastinum/Nodes: Edematous changes noted throughout the mediastinum without free mediastinal fluid or gas. Normal thyroid gland and thoracic inlet. No acute abnormality of the trachea or esophagus. No worrisome mediastinal or axillary adenopathy. Hilar nodal evaluation is limited in the absence of intravenous contrast media. Lungs/Pleura: Right pleural effusion trace left effusion. There are perihilar predominant areas of patchy and ground-glass opacity with additional dependent consolidative density in the left lung base as well. No pneumothorax. Slight asymmetry of the chest wall some which may be attributable to atelectatic volume loss. Musculoskeletal: Asymmetric chest wall. No acute osseous abnormality or suspicious osseous lesion. Diffuse body wall edema. Relative paucity of subcutaneous fat noted incidentally. CT ABDOMEN PELVIS FINDINGS Hepatobiliary: Enlarged, hypoattenuating and slightly heterogeneous liver with a nodular surface contour and perihepatic ascites. No focal liver lesion. Gallbladder contains a partially calcified gallstones towards the gallbladder neck. Mild wall thickening nonspecific in the setting of presumed liver disease and ascites. Pancreas: No pancreatic ductal dilatation or surrounding inflammatory changes. Spleen: Splenomegaly.  No concerning focal splenic lesion. Adrenals/Urinary Tract: No focal adrenal nodule or mass. Bilateral nonspecific perinephric stranding. No visible or contour deforming renal lesion. Nonobstructing  calculi present in the bilateral lower poles measuring up to 3 mm in the right and 2 mm on the left. No obstructive urolithiasis or hydronephrosis. Mild circumferential bladder wall thickening noted, possibly related to underdistention. Stomach/Bowel: Distal esophagus, stomach and duodenum are unremarkable. Diffusely edematous appearance of the large and small bowel. No evidence of obstruction. Appendix in the right lower quadrant without focal appendiceal inflammation. Vascular/Lymphatic: Limited assessment of the vasculature in the absence of contrast media. Suspect some degree of upper abdominal venous collateralization diffuse edematous changes throughout the mesentery in abdomen limiting assessment lymph nodes. Edematous central mesenteric nodes are present. Question an enlarged node versus decompressed loop of bowel near the level of the aortic bifurcation (3/81) measuring up to 16 mm short axis. Reproductive: No concerning adnexal masses. Few benign calcification associated with what appears to be the right ovary in the right adnexa. Rounded peripheral calcification of a probable intramural fibroid in the deep pelvis measuring 5.4 cm in size (3/98). Other: Diffuse circumferential body wall edema extensive edematous changes throughout the mesentery as well as moderate volume ascites. No bowel containing hernia. Musculoskeletal: No acute osseous abnormality or suspicious osseous lesion. Included portions of the forearms included within the level of imaging are unremarkable. IMPRESSION: Features of diffuse volume overload/anasarca with mediastinal, peritoneal and diffuse body wall edematous changes moderate volume ascites and layering bilateral effusions as well, right greater than left. Perihilar predominant patchy and ground-glass opacities in  both lungs, may reflect developing alveolar edema, infection, ARDS or some combination there of. Stigmata of cirrhosis/intrinsic liver disease with a heterogeneous,  hypoattenuating and nodular liver. Correlate with patient history and LFTs. Question several enlarged retroperitoneal lymph nodes versus decompressed bowel loops towards the level of the I ordered bifurcation. Difficult to fully ascertain in the absence of contrast media. Mild gallbladder wall thickening is nonspecific in the setting of presumed liver disease. Could correlate for focal symptoms. The diffusely edematous appearance of the bowel favored to be related to the process of anasarca rather than enterocolitis though could correlate for clinical symptoms. Cardiomegaly. Hypoattenuation of the cardiac blood pool compatible with anemia. Electronically Signed   By: Kreg Shropshire M.D.   On: 06/17/2021 19:26   US Abdomen Limited RUQ (LIVER/GB)  Result Date: 06/18/2021 CLINICAL DATA:  Cirrhosis EXAM: ULTRASOUND ABDOMEN LIMITED RIGHT UPPER QUADRANT COMPARISON:  None. FINDINGS: Gallbladder: No gallstones or wall thickening visualized. Sludge is present. No sonographic Murphy sign noted by sonographer. Common bile duct: Diameter: 2.5 mm, normal Liver: No focal lesion identified. Increased parenchymal echogenicity. Coarsening of echotexture. Nodular surface contour. Portal vein is patent on color Doppler imaging with reversal of normal direction of blood flow towards the liver. Other: Right pleural effusion.  Ascites. IMPRESSION: Cirrhosis with evidence of portal hypertension. There is reversal of flow in the portal vein. Gallbladder sludge. Ascites and right pleural effusion. Electronically Signed   By: Guadlupe Spanish M.D.   On: 06/18/2021 18:31    Scheduled Meds:  sodium chloride   Intravenous Once   sodium chloride   Intravenous Once   Chlorhexidine Gluconate Cloth  6 each Topical Daily   doxycycline  100 mg Oral Q12H   folic acid  1 mg Oral Daily   lactulose  30 g Oral BID   mouth rinse  15 mL Mouth Rinse BID   mupirocin ointment  1 application Nasal BID   thiamine  100 mg Oral Daily   Continuous  Infusions:  sodium chloride Stopped (06/19/21 0832)   albumin human 12.5 g (06/19/21 0539)   PRN Meds:.   ASSESMENT:      Decompensated alcoholic liver disease with cirrhosis, Childs class C.  Meld-Na 133.  MDF 54.  Acute hepatitis serologies are all nonreactive.  No prednisolone/steroids due to likely pneumonia.  Transaminases essentially stable, T bili rising.  Profound anemia with admission Hgb 1.8.  5 PRBCs thus far, latest 7/26.  Hgb 7.  FOBT negative.  Anemia labs consistent with folate deficiency.  On oral folate.    AKI.  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  BUN/Creat improving.  Urine Na <10.  Day 2 Albumin infusions (q 6h x 8 ordered).       Splenomegaly, thrombocytopenia.   Platelets 76 K.    Ascites.  Seen on both CT scan and ultrasound.  Suspicion for CAP.  On Rocephin, azithromycin >> doxycycline po started today..  Pleural effusion per ultrasound intubated in ED to protect airway but extubated yesterday.     AMS, likely multifactorial but likely hepatic encephalopathy with elevated ammonia improved since initiating lactulose.   PLAN     Paracentesis w labs ordered.    Renal consult??,  Messaged Dr. Kendrick Fries about this yesterday.  At this point with improving renal function may not be necessary.  Complete the 8 doses of IV albumin which was ordered yesterday afternoon.  Decreased Lactulose to 20 g bid.      Jennye Moccasin  06/19/2021, 10:58 AM Phone [phone]  Attending physician's note   I have taken an interval history, reviewed the chart and examined the patient. I agree with the Advanced Practitioner's note, impression and recommendations.   Extubated.  Tolerating p.o. Much alert.  Proceed with paracenteses to r/o SBP Creatinine improving. Off Levophed.  Continue albumin Would need diuretics soon Limit IV fluids (Na load) No GI bleeding. We will follow along   D/W Dr Kendrick Fries   Edman Circle, MD [facility] [phone]

## 2021-06-19 NOTE — Evaluation (Signed)
Physical Therapy Evaluation Patient Details Name: Michelle Little MRN: [id number] DOB: [birth year] Today's Date: 06/19/2021   History of Present Illness  35 y.o. female presenting to ED 7/25 with decreased responsiveness, poor p.o. intake, jaundice and BLE swelling. Patient admitted with acute hepatic encephalopathy/hyperammonemia, decompensated liver failure, suspected seizure, acute respiratory failure requiring mechanical ventilation secondary pulmonary edema +/- aspiration pneumonia s/p extubation 7/25, acute on chronic blood loss secondary to anemia septic shock and ETOH withdrawl. PMHx significant for ETOH use disorder and IDA.  Clinical Impression   Pt admitted secondary to problem above with deficits below. PTA patient was independent with mobility, living in a shelter with her boyfriend and 64 yo daughter. Pt currently requires up to mod assist for very short distance ambulation with knees buckling and near fall. Anticipate reasonable progress as pt highly motivated to get better and leave hospital to be with her daughter.  Anticipate patient will benefit from PT to address problems listed below.Will continue to follow acutely to maximize functional mobility independence and safety.       Follow Up Recommendations CIR    Equipment Recommendations  Rolling walker with 5" wheels    Recommendations for Other Services Rehab consult     Precautions / Restrictions Precautions Precautions: Fall Restrictions Weight Bearing Restrictions: No      Mobility  Bed Mobility Overal bed mobility: Needs Assistance Bed Mobility: Supine to Sit     Supine to sit: Min assist;HOB elevated     General bed mobility comments: incr time; able to get to seated EOB wihtout physical assist, but needed assist to scoot out to EOB (especially due to rectal tube)    Transfers Overall transfer level: Needs assistance Equipment used: None Transfers: Sit to/from Stand Sit to Stand: Mod assist;From  elevated surface (from ICU bed at lowest height)         General transfer comment: initialy posterior lean, however able to center over her feet with time and cues  Ambulation/Gait Ambulation/Gait assistance: Mod assist Gait Distance (Feet): 2 Feet Assistive device: None Gait Pattern/deviations: Step-to pattern     General Gait Details: pivotal steps from bed to chair; as approached chair, bil knees buckled and pt collapsed into sitting on chair  Stairs            Wheelchair Mobility    Modified Rankin (Stroke Patients Only)       Balance Overall balance assessment: Needs assistance Sitting-balance support: No upper extremity supported;Feet supported Sitting balance-Leahy Scale: Fair     Standing balance support: Bilateral upper extremity supported Standing balance-Leahy Scale: Poor                               Pertinent Vitals/Pain Pain Assessment: No/denies pain    Home Living Family/patient expects to be discharged to:: Shelter/Homeless                 Additional Comments: Reports she has been in  2-3 months. Has boyfriend and 3yo daughter. Staying in a shelter    Prior Function Level of Independence: Independent               Hand Dominance        Extremity/Trunk Assessment   Upper Extremity Assessment Upper Extremity Assessment: Generalized weakness    Lower Extremity Assessment Lower Extremity Assessment: Generalized weakness    Cervical / Trunk Assessment Cervical / Trunk Assessment: Other exceptions Cervical / Trunk Exceptions: ascites  Communication  Communication: No difficulties  Cognition Arousal/Alertness: Awake/alert Behavior During Therapy: WFL for tasks assessed/performed Overall Cognitive Status: Within Functional Limits for tasks assessed                                        General Comments General comments (skin integrity, edema, etc.): pt on room air with VSS per ICU  monitor    Exercises     Assessment/Plan    PT Assessment Patient needs continued PT services  PT Problem List Decreased strength;Decreased activity tolerance;Decreased balance;Decreased mobility;Decreased knowledge of use of DME;Obesity       PT Treatment Interventions DME instruction;Gait training;Stair training;Functional mobility training;Therapeutic activities;Therapeutic exercise;Balance training;Patient/family education    PT Goals (Current goals can be found in the Care Plan section)  Acute Rehab PT Goals Patient Stated Goal: be able to walk out of here PT Goal Formulation: With patient Time For Goal Achievement: 07/03/21 Potential to Achieve Goals: Good    Frequency Min 3X/week   Barriers to discharge Other (comment) from shelter    Co-evaluation               AM-PAC PT "6 Clicks" Mobility  Outcome Measure Help needed turning from your back to your side while in a flat bed without using bedrails?: A Little Help needed moving from lying on your back to sitting on the side of a flat bed without using bedrails?: A Little Help needed moving to and from a bed to a chair (including a wheelchair)?: A Lot Help needed standing up from a chair using your arms (e.g., wheelchair or bedside chair)?: A Lot Help needed to walk in hospital room?: Total Help needed climbing 3-5 steps with a railing? : Total 6 Click Score: 12    End of Session Equipment Utilized During Treatment: Gait belt Activity Tolerance: Patient limited by fatigue Patient left: in chair;with call bell/phone within reach;with chair alarm set Nurse Communication: Mobility status;Other (comment) (2 person pivot for safety; could do well with stedy) PT Visit Diagnosis: Muscle weakness (generalized) (M62.81);Difficulty in walking, not elsewhere classified (R26.2)    Time: 6579-0383 PT Time Calculation (min) (ACUTE ONLY): 24 min   Charges:   PT Evaluation $PT Eval Low Complexity: 1 Low            Jerolyn Center, PT Pager [phone]   Zena Amos 06/19/2021, 9:18 AM

## 2021-06-19 NOTE — Evaluation (Signed)
Occupational Therapy Evaluation Patient Details Name: Michelle Little MRN: [id number] DOB: [birth year] Today's Date: 06/19/2021    History of Present Illness 35 y.o. female presenting to ED 7/25 with decreased responsiveness, poor p.o. intake, jaundice and BLE swelling. Patient admitted with acute hepatic encephalopathy/hyperammonemia, decompensated liver failure, suspected seizure, acute respiratory failure requiring mechanical ventilation secondary pulmonary edema +/- aspiration pneumonia s/p extubation 7/25, acute on chronic blood loss secondary to anemia septic shock and ETOH withdrawl. PMHx significant for ETOH use disorder and IDA.   Clinical Impression   PTA patient was living with her boyfriend and 3y.o. daughter at a homeless shelter and was grossly I with ADLs/IADLs. Patient currently functioning below baseline demonstrating observed ADLs including LB dressing with Max to Total A and sit to stand transfers with use of Stedy and Max A +2 with increased time/effort. Patient also limited by deficits listed below including generalized weakness/debility, decreased activity tolerance and balance deficits and would benefit from continued acute OT services in prep for safe d/c to next level of care with recommendation for CIR.     Follow Up Recommendations  CIR    Equipment Recommendations  Other (comment) (Defer to next level of care.)    Recommendations for Other Services Rehab consult     Precautions / Restrictions Precautions Precautions: Fall Restrictions Weight Bearing Restrictions: No      Mobility Bed Mobility Overal bed mobility: Needs Assistance Bed Mobility: Sit to Supine       Sit to supine: Mod assist   General bed mobility comments: Assist to advance BLE from EOB to bed surface and cues for sequencing. Able to bridge in supine with Mod A to adjust chuck pad.    Transfers Overall transfer level: Needs assistance Equipment used: None Transfers: Sit to/from  Stand Sit to Stand: Max assist;+2 physical assistance;+2 safety/equipment         General transfer comment: Attempted sit to stand from recliner with and without Stedy and +1 assist but patient unable to stand. Required Max A +2 for sit to stand in [place] with cues for sequencing/posture.    Balance Overall balance assessment: Needs assistance Sitting-balance support: No upper extremity supported;Feet supported Sitting balance-Leahy Scale: Fair     Standing balance support: Bilateral upper extremity supported Standing balance-Leahy Scale: Poor                             ADL either performed or assessed with clinical judgement   ADL Overall ADL's : Needs assistance/impaired                     Lower Body Dressing: Total assistance;Bed level Lower Body Dressing Details (indicate cue type and reason): Total A to doff/don footwear. Toilet Transfer: Total assistance Toilet Transfer Details (indicate cue type and reason): Simualted with use of Stedy and Max A +2.           General ADL Comments: Patient greatly limited by poor activity tolerance and generalized weakness/debility.     Vision Patient Visual Report: No change from baseline       Perception     Praxis      Pertinent Vitals/Pain Pain Assessment: Faces Faces Pain Scale: Hurts little more Pain Location: Generalized with movement Pain Descriptors / Indicators: Sore Pain Intervention(s): Limited activity within patient's tolerance;Monitored during session;Repositioned     Hand Dominance     Extremity/Trunk Assessment Upper Extremity Assessment Upper Extremity Assessment: Generalized weakness  Lower Extremity Assessment Lower Extremity Assessment: Generalized weakness   Cervical / Trunk Assessment Cervical / Trunk Assessment: Other exceptions Cervical / Trunk Exceptions: ascites   Communication Communication Communication: No difficulties   Cognition Arousal/Alertness:  Awake/alert Behavior During Therapy: WFL for tasks assessed/performed Overall Cognitive Status: Within Functional Limits for tasks assessed                                     General Comments  VSS on RA.    Exercises     Shoulder Instructions      Home Living Family/patient expects to be discharged to:: Shelter/Homeless                                 Additional Comments: Reports she has been in [place] 2-3 months. Has boyfriend and 3yo daughter. Staying in a shelter      Prior Functioning/Environment Level of Independence: Independent                 OT Problem List: Decreased strength;Decreased activity tolerance;Impaired balance (sitting and/or standing);Decreased knowledge of use of DME or AE;Increased edema      OT Treatment/Interventions: Self-care/ADL training;Therapeutic exercise;Energy conservation;DME and/or AE instruction;Therapeutic activities;Patient/family education;Balance training    OT Goals(Current goals can be found in the care plan section) Acute Rehab OT Goals Patient Stated Goal: be able to walk out of here OT Goal Formulation: With patient Time For Goal Achievement: 07/03/21 Potential to Achieve Goals: Good  OT Frequency: Min 2X/week   Barriers to D/C:            Co-evaluation              AM-PAC OT "6 Clicks" Daily Activity     Outcome Measure Help from another person eating meals?: None Help from another person taking care of personal grooming?: A Little Help from another person toileting, which includes using toliet, bedpan, or urinal?: Total Help from another person bathing (including washing, rinsing, drying)?: A Lot Help from another person to put on and taking off regular upper body clothing?: A Lot Help from another person to put on and taking off regular lower body clothing?: Total 6 Click Score: 13   End of Session Equipment Utilized During Treatment: Gait belt;Other (comment) Antony Salmon) Nurse  Communication: Mobility status;Other (comment) (Response to treatment.)  Activity Tolerance: Patient limited by fatigue Patient left: in bed;with call bell/phone within reach;with bed alarm set  OT Visit Diagnosis: Unsteadiness on feet (R26.81);Other abnormalities of gait and mobility (R26.89);Muscle weakness (generalized) (M62.81)                Time: 7628-3151 OT Time Calculation (min): 35 min Charges:  OT General Charges $OT Visit: 1 Visit OT Evaluation $OT Eval Moderate Complexity: 1 Mod OT Treatments $Therapeutic Activity: 8-22 mins  Jaylise Peek H. OTR/L Supplemental OT, Department of rehab services [phone]  Tineka Uriegas R H. 06/19/2021, 1:04 PM

## 2021-06-20 ENCOUNTER — Inpatient Hospital Stay (HOSPITAL_COMMUNITY): Payer: Medicaid Other

## 2021-06-20 DIAGNOSIS — K7031 Alcoholic cirrhosis of liver with ascites: Secondary | ICD-10-CM

## 2021-06-20 DIAGNOSIS — K7460 Unspecified cirrhosis of liver: Secondary | ICD-10-CM

## 2021-06-20 LAB — CBC
HCT: 21.4 % — ABNORMAL LOW (ref 36.0–46.0)
HCT: 23.4 % — ABNORMAL LOW (ref 36.0–46.0)
Hemoglobin: 7.4 g/dL — ABNORMAL LOW (ref 12.0–15.0)
Hemoglobin: 8.3 g/dL — ABNORMAL LOW (ref 12.0–15.0)
MCH: 31.6 pg (ref 26.0–34.0)
MCH: 32.9 pg (ref 26.0–34.0)
MCHC: 34.6 g/dL (ref 30.0–36.0)
MCHC: 35.5 g/dL (ref 30.0–36.0)
MCV: 91.5 fL (ref 80.0–100.0)
MCV: 92.9 fL (ref 80.0–100.0)
Platelets: 69 10*3/uL — ABNORMAL LOW (ref 150–400)
Platelets: 71 10*3/uL — ABNORMAL LOW (ref 150–400)
RBC: 2.34 MIL/uL — ABNORMAL LOW (ref 3.87–5.11)
RBC: 2.52 MIL/uL — ABNORMAL LOW (ref 3.87–5.11)
RDW: 23 % — ABNORMAL HIGH (ref 11.5–15.5)
RDW: 23.6 % — ABNORMAL HIGH (ref 11.5–15.5)
WBC: 15.5 10*3/uL — ABNORMAL HIGH (ref 4.0–10.5)
WBC: 16.3 10*3/uL — ABNORMAL HIGH (ref 4.0–10.5)
nRBC: 6.2 % — ABNORMAL HIGH (ref 0.0–0.2)
nRBC: 6.3 % — ABNORMAL HIGH (ref 0.0–0.2)

## 2021-06-20 LAB — BASIC METABOLIC PANEL
Anion gap: 7 (ref 5–15)
BUN: 24 mg/dL — ABNORMAL HIGH (ref 6–20)
CO2: 20 mmol/L — ABNORMAL LOW (ref 22–32)
Calcium: 7.4 mg/dL — ABNORMAL LOW (ref 8.9–10.3)
Chloride: 106 mmol/L (ref 98–111)
Creatinine, Ser: 0.96 mg/dL (ref 0.44–1.00)
GFR, Estimated: >60 mL/min (ref >60)
Glucose, Bld: 117 mg/dL — ABNORMAL HIGH (ref 70–99)
Potassium: 3.5 mmol/L (ref 3.5–5.1)
Sodium: 133 mmol/L — ABNORMAL LOW (ref 135–145)

## 2021-06-20 LAB — COMPREHENSIVE METABOLIC PANEL
ALT: 39 U/L (ref 0–44)
AST: 115 U/L — ABNORMAL HIGH (ref 15–41)
Albumin: 1.7 g/dL — ABNORMAL LOW (ref 3.5–5.0)
Alkaline Phosphatase: 101 U/L (ref 38–126)
Anion gap: 6 (ref 5–15)
BUN: 28 mg/dL — ABNORMAL HIGH (ref 6–20)
CO2: 21 mmol/L — ABNORMAL LOW (ref 22–32)
Calcium: 7.5 mg/dL — ABNORMAL LOW (ref 8.9–10.3)
Chloride: 105 mmol/L (ref 98–111)
Creatinine, Ser: 1.25 mg/dL — ABNORMAL HIGH (ref 0.44–1.00)
GFR, Estimated: 57 mL/min — ABNORMAL LOW (ref >60)
Glucose, Bld: 106 mg/dL — ABNORMAL HIGH (ref 70–99)
Potassium: 2.7 mmol/L — CL (ref 3.5–5.1)
Sodium: 132 mmol/L — ABNORMAL LOW (ref 135–145)
Total Bilirubin: 8.2 mg/dL — ABNORMAL HIGH (ref 0.3–1.2)
Total Protein: 7.2 g/dL (ref 6.5–8.1)

## 2021-06-20 LAB — LEGIONELLA PNEUMOPHILA SEROGP 1 UR AG: L. pneumophila Serogp 1 Ur Ag: NEGATIVE

## 2021-06-20 LAB — GLUCOSE, CAPILLARY
Glucose-Capillary: 103 mg/dL — ABNORMAL HIGH (ref 70–99)
Glucose-Capillary: 106 mg/dL — ABNORMAL HIGH (ref 70–99)
Glucose-Capillary: 116 mg/dL — ABNORMAL HIGH (ref 70–99)
Glucose-Capillary: 93 mg/dL (ref 70–99)
Glucose-Capillary: 94 mg/dL (ref 70–99)
Glucose-Capillary: 98 mg/dL (ref 70–99)

## 2021-06-20 LAB — CULTURE, RESPIRATORY W GRAM STAIN
Culture: NORMAL
Gram Stain: NONE SEEN

## 2021-06-20 LAB — MAGNESIUM: Magnesium: 2 mg/dL (ref 1.7–2.4)

## 2021-06-20 MED ORDER — ALBUTEROL SULFATE (2.5 MG/3ML) 0.083% IN NEBU
2.5000 mg | INHALATION_SOLUTION | RESPIRATORY_TRACT | Status: DC | PRN
  Administered 2021-06-20: 2.5 mg via RESPIRATORY_TRACT
  Filled 2021-06-20: qty 3

## 2021-06-20 MED ORDER — SENNOSIDES-DOCUSATE SODIUM 8.6-50 MG PO TABS: 1.0000 | ORAL_TABLET | Freq: Every evening | ORAL | Status: DC | PRN

## 2021-06-20 MED ORDER — POTASSIUM CHLORIDE CRYS ER 20 MEQ PO TBCR
40.0000 meq | EXTENDED_RELEASE_TABLET | ORAL | Status: AC
  Administered 2021-06-20: 40 meq via ORAL
  Filled 2021-06-20: qty 2

## 2021-06-20 MED ORDER — RIFAXIMIN 550 MG PO TABS
550.0000 mg | ORAL_TABLET | Freq: Two times a day (BID) | ORAL | Status: DC
  Administered 2021-06-20 – 2021-06-30 (×20): 550 mg via ORAL
  Filled 2021-06-20 (×21): qty 1

## 2021-06-20 MED ORDER — FUROSEMIDE 10 MG/ML IJ SOLN: 40.0000 mg | Freq: Once | INTRAMUSCULAR | Status: AC

## 2021-06-20 MED ORDER — POTASSIUM CHLORIDE CRYS ER 20 MEQ PO TBCR
40.0000 meq | EXTENDED_RELEASE_TABLET | Freq: Once | ORAL | Status: AC
  Administered 2021-06-20: 40 meq via ORAL
  Filled 2021-06-20: qty 2

## 2021-06-20 MED ORDER — ACETAMINOPHEN 325 MG PO TABS
650.0000 mg | ORAL_TABLET | Freq: Four times a day (QID) | ORAL | Status: DC | PRN
  Administered 2021-06-23: 650 mg via ORAL
  Filled 2021-06-20: qty 2

## 2021-06-20 MED ORDER — FUROSEMIDE 10 MG/ML IJ SOLN
INTRAMUSCULAR | Status: AC
  Administered 2021-06-20: 40 mg
  Filled 2021-06-20: qty 4

## 2021-06-20 MED ORDER — HYDRALAZINE HCL 20 MG/ML IJ SOLN: 10.0000 mg | INTRAMUSCULAR | Status: DC | PRN

## 2021-06-20 MED ORDER — POTASSIUM CHLORIDE 10 MEQ/100ML IV SOLN
10.0000 meq | INTRAVENOUS | Status: AC
  Administered 2021-06-20 (×4): 10 meq via INTRAVENOUS
  Filled 2021-06-20 (×4): qty 100

## 2021-06-20 MED ORDER — IPRATROPIUM-ALBUTEROL 0.5-2.5 (3) MG/3ML IN SOLN: 3.0000 mL | RESPIRATORY_TRACT | Status: DC | PRN

## 2021-06-20 MED ORDER — POTASSIUM CHLORIDE CRYS ER 20 MEQ PO TBCR
20.0000 meq | EXTENDED_RELEASE_TABLET | Freq: Once | ORAL | Status: AC
  Administered 2021-06-20: 20 meq via ORAL
  Filled 2021-06-20: qty 1

## 2021-06-20 NOTE — Progress Notes (Signed)
Interventional Radiology Brief Note:  Patient s/p bedside paracentesis by CCM.  Labs pending.  IR order discontinued.   Loyce Dys, MS RD PA-C

## 2021-06-20 NOTE — Progress Notes (Signed)
PROGRESS NOTE    Michelle Michelle [name]  WUX:[id number] DOB: 04-06-1986 DOA: 06/17/2021 PCP: Pcp, No   Brief Narrative:  35 year old with history of thyroid disease, IDA, tobacco use, alcohol use presented from [facility] after Michelle [name] boyfriend could not wake Michelle [name] up in the morning.  Patient had been having difficulty walking and feeling weak for past several weeks.  Upon admission she was noted to be 78 decompensated cirrhosis with hepatic encephalopathy.  She was also started on doxycycline for community-acquired pneumonia.  Also she was found to have profound anemia on admission with hemoglobin of 1.8 requiring 5 units of PRBC transfusion, FOBT was negative.  Paracentesis performed on 7/27 which was negative for SBP.   Assessment & Plan:   Active Problems:   Acute encephalopathy   Anemia   Hepatic encephalopathy (HCC)   Acute renal failure (HCC)   Other ascites  Decompensated alcohol liver cirrhosis Hepatic encephalopathy Ascites, hypoalbuminemia, thrombocytopenia - Continue aggressive lactulose, minimum 3-5 soft bowel movements daily.  Monitor mental status.  GI team is following.  Status post paracentesis 7/27 negative for SBP.  Echocardiogram showed EF of 55%. - Hepatitis panel-negative - CT head-negative Minimize use to Ativan. Overall poor prognosis, high mortality.   Profound anemia Folate deficiency - No obvious signs of blood loss.  Admission hemoglobin 1.8.  FOBT negative.  Folate supplements have been added status post 5 units PRBC transfusion  Community-acquired pneumonia - On doxycycline, complete total 5-day course  Lactic acidosis - Resolved  Hypokalemia - Aggressive repletion  Acute kidney injury - Admission creatinine 3.3.  Improved with fluids.  Today is 1.25.  Asymptomatic bacteriuria - Urine cultures are negative.  Alcohol abuse - Monitor for any signs of alcohol withdrawal.   DVT prophylaxis: Place and maintain sequential compression device Start:  06/17/21 2026 SCDs Start: 06/17/21 1751  Code Status: Full code Family Communication:  Husband at bedside  Status is: Inpatient  Remains inpatient appropriate because:Inpatient level of care appropriate due to severity of illness  Dispo: The patient is from: Home              Anticipated d/c is to: Home              Patient currently is not medically stable to d/c.  Encephalopathic, drowsy.  Not safe for discharge   Difficult to place patient No     Subjective: Patient was given Ativan last night and she continues to remain drowsy this morning.  Easily arousable and protecting Michelle [name] airway.  No acute complaints.  She has not had a bowel movement since last night. Husband tells me that she has been drinking heavily at home at least for past 6 years since he has known Michelle [name].    Examination:  Constitutional: Drowsy but easily arousable.  Scleral icterus Respiratory: Clear to auscultation bilaterally Cardiovascular: Normal sinus rhythm, no rubs Abdomen: Nontender nondistended good bowel sounds Musculoskeletal: No edema noted Skin: Jaundice skin Neurologic: Grossly moving all the extremities Psychiatric: Difficult to assess   Objective: Vitals:   06/20/21 0400 06/20/21 0410 06/20/21 0500 06/20/21 0700  BP: 118/82  111/73 114/85  Pulse: (!) 113  100 (!) 106  Resp:   (!) 21 (!) 24  Temp:    98.6 F (37 C)  TempSrc:    Oral  SpO2:    99%  Weight:  72.9 kg    Height:        Intake/Output Summary (Last 24 hours) at 06/20/2021 0743 Last data filed at 06/20/2021 [phone]  Gross per 24 hour  Intake 915.06 ml  Output 2400 ml  Net -1484.94 ml   Filed Weights   06/18/21 0500 06/19/21 0500 06/20/21 0410  Weight: 75.3 kg 72.5 kg 72.9 kg     Data Reviewed:   CBC: Recent Labs  Lab 06/17/21 1015 06/17/21 1647 06/18/21 0223 06/18/21 0648 06/18/21 0731 06/18/21 1326 06/18/21 2230 06/19/21 0456 06/19/21 2225 06/20/21 0456  WBC NO RESULT, LAB ERROR, SEE [id number]  18.8*  --   14.8*  --   --   --   --  13.4* 14.5* 15.5*  NEUTROABS Not Measured  14.4*  --   --   --   --   --   --   --   --   --   HGB NO RESULT, LAB ERROR, SEE [id number]  1.8*   < > 6.0*   < >  --  7.2* 7.6* 7.0* 7.8* 7.4*  HCT Not Measured  7.3*   < > 15.1*   < >  --  20.0* 21.9* 19.3* 21.6* 21.4*  MCV Not Measured  125.9*  --  89.9  --   --   --   --  90.6 93.5 91.5  PLT Not Measured  137*  --  97*  --  78*  --   --  76* 76* 69*   < > = values in this interval not displayed.   Basic Metabolic Panel: Recent Labs  Lab 06/17/21 1015 06/17/21 2003 06/18/21 0223 06/18/21 0926 06/19/21 0456 06/19/21 2225 06/20/21 0456  NA 133* 139 134* 136 136  --  132*  K 5.0 4.1 3.9 3.9 3.5  --  2.7*  CL 103  --  105 105 107  --  105  CO2 11*  --  20* 21* 20*  --  21*  GLUCOSE 64*  --  131* 114* 110*  --  106*  BUN 34*  --  39* 40* 38*  --  28*  CREATININE 3.32*  --  2.80* 2.58* 2.02*  --  1.25*  CALCIUM 7.9*  --  7.6* 7.4* 7.6*  --  7.5*  MG  --   --  1.5*  --   --  2.2 2.0  PHOS  --   --   --   --   --  2.6  --    GFR: Estimated Creatinine Clearance: 60.3 mL/min (A) (by C-G formula based on SCr of 1.25 mg/dL (H)). Liver Function Tests: Recent Labs  Lab 06/17/21 1015 06/18/21 0223 06/19/21 0456 06/20/21 0456  AST 125* 122*  121* 119* 115*  ALT 26 31  29  32 39  ALKPHOS 117 102  98 90 101  BILITOT 5.9* 7.7*  7.5* 8.7* 8.2*  PROT 8.0 7.2  7.1 7.4 7.2  ALBUMIN 1.3* 1.2*  1.2* 1.6* 1.7*   No results for input(s): LIPASE, AMYLASE in the last 168 hours. Recent Labs  Lab 06/17/21 1402 06/18/21 0223  AMMONIA 82* 51*   Coagulation Profile: Recent Labs  Lab 06/17/21 1015 06/18/21 0223 06/18/21 0731  INR 2.3* 2.1* 2.1*   Cardiac Enzymes: No results for input(s): CKTOTAL, CKMB, CKMBINDEX, TROPONINI in the last 168 hours. BNP (last 3 results) No results for input(s): PROBNP in the last 8760 hours. HbA1C: No results for input(s): HGBA1C in the last 72 hours. CBG: Recent Labs  Lab  06/19/21 1503 06/19/21 2005 06/19/21 2325 06/20/21 0357 06/20/21 0726  GLUCAP 116* 115* 103* 98 93   Lipid Profile: Recent Labs  06/18/21 0223  TRIG 123   Thyroid Function Tests: Recent Labs    06/18/21 1326  TSH 0.425   Anemia Panel: Recent Labs    06/17/21 1748  VITAMINB12 1,496*  FOLATE 5.6*  FERRITIN 52  TIBC 193*  IRON 41  RETICCTPCT 6.8*   Sepsis Labs: Recent Labs  Lab 06/17/21 1015 06/17/21 1402 06/18/21 0223 06/18/21 0731 06/19/21 0456  PROCALCITON  --   --  3.98  --  2.44  LATICACIDVEN 10.6* 10.6*  --  1.7  --     Recent Results (from the past 240 hour(s))  Urine Culture     Status: None   Collection Time: 06/17/21 10:11 AM   Specimen: In/Out Cath Urine  Result Value Ref Range Status   Specimen Description IN/OUT CATH URINE  Final   Special Requests NONE  Final   Culture   Final    NO GROWTH Performed at [facility], [address] [place], [place] [address]    Report Status 06/19/2021 FINAL  Final  Blood Culture (routine x 2)     Status: None (Preliminary result)   Collection Time: 06/17/21 10:15 AM   Specimen: BLOOD LEFT FOREARM  Result Value Ref Range Status   Specimen Description BLOOD LEFT FOREARM  Final   Special Requests   Final    BOTTLES DRAWN AEROBIC AND ANAEROBIC Blood Culture adequate volume   Culture   Final    NO GROWTH 2 DAYS Performed at [facility], [address], [place], [place] [address]    Report Status PENDING  Incomplete  Blood Culture (routine x 2)     Status: None (Preliminary result)   Collection Time: 06/17/21 10:17 AM   Specimen: BLOOD  Result Value Ref Range Status   Specimen Description BLOOD SITE NOT SPECIFIED  Final   Special Requests   Final    BOTTLES DRAWN AEROBIC AND ANAEROBIC Blood Culture results may not be optimal due to an excessive volume of blood received in culture bottles   Culture   Final    NO GROWTH 2 DAYS Performed at [name], [address],  [place], [place] [address]    Report Status PENDING  Incomplete  Resp Panel by RT-PCR (Flu A&B, Covid)     Status: None   Collection Time: 06/17/21  2:52 PM  Result Value Ref Range Status   SARS Coronavirus 2 by RT PCR NEGATIVE NEGATIVE Final    Comment: (NOTE) SARS-CoV-2 target nucleic acids are NOT DETECTED.  The SARS-CoV-2 RNA is generally detectable in upper respiratory specimens during the acute phase of infection. The lowest concentration of SARS-CoV-2 viral copies this assay can detect is 138 copies/mL. A negative result does not preclude SARS-Cov-2 infection and should not be used as the sole basis for treatment or other patient management decisions. A negative result may occur with  improper specimen collection/handling, submission of specimen other than nasopharyngeal swab, presence of viral mutation(s) within the areas targeted by this assay, and inadequate number of viral copies(<138 copies/mL). A negative result must be combined with clinical observations, patient history, and epidemiological information. The expected result is Negative.  Fact Sheet for Patients:  BloggerCourse.com  Fact Sheet for Healthcare Providers:  SeriousBroker.it  This test is no t yet approved or cleared by the [place] FDA and  has been authorized for detection and/or diagnosis of SARS-CoV-2 by FDA under an Emergency Use Authorization (EUA). This EUA will remain  in effect (meaning this  test can be used) for the duration of the COVID-19 declaration under Section 564(b)(1) of the Act, 21 U.S.C.section 360bbb-3(b)(1), unless the authorization is terminated  or revoked sooner.       Influenza A by PCR NEGATIVE NEGATIVE Final   Influenza B by PCR NEGATIVE NEGATIVE Final    Comment: (NOTE) The Xpert Xpress SARS-CoV-2/FLU/RSV plus assay is intended as an aid in the diagnosis of influenza from Nasopharyngeal swab specimens and should not be  used as a sole basis for treatment. Nasal washings and aspirates are unacceptable for Xpert Xpress SARS-CoV-2/FLU/RSV testing.  Fact Sheet for Patients: BloggerCourse.com  Fact Sheet for Healthcare Providers: SeriousBroker.it  This test is not yet approved or cleared by the [place] FDA and has been authorized for detection and/or diagnosis of SARS-CoV-2 by FDA under an Emergency Use Authorization (EUA). This EUA will remain in effect (meaning this test can be used) for the duration of the COVID-19 declaration under Section 564(b)(1) of the Act, 21 U.S.C. section 360bbb-3(b)(1), unless the authorization is terminated or revoked.  Performed at [facility], [address] [place], [place] [address]   Culture, Respiratory w Gram Stain     Status: None (Preliminary result)   Collection Time: 06/17/21 11:17 PM   Specimen: Tracheal Aspirate; Respiratory  Result Value Ref Range Status   Specimen Description TRACHEAL ASPIRATE  Final   Special Requests NONE  Final   Gram Stain   Final    NO SQUAMOUS EPITHELIAL CELLS SEEN FEW WBC PRESENT,BOTH PMN AND MONONUCLEAR NO ORGANISMS SEEN    Culture   Final    CULTURE REINCUBATED FOR BETTER GROWTH Performed at [facility], [address], [place], [place] [address]    Report Status PENDING  Incomplete  MRSA Next Gen by PCR, Nasal     Status: Abnormal   Collection Time: 06/18/21  1:16 AM   Specimen: Nasal Mucosa; Nasal Swab  Result Value Ref Range Status   MRSA by PCR Next Gen DETECTED (A) NOT DETECTED Final    Comment: RESULT CALLED TO, READ BACK BY AND VERIFIED WITH: GERMILLION,H RN 06/18/2021 AT 0218 SKEEN,P (NOTE) The GeneXpert MRSA Assay (FDA approved for NASAL specimens only), is one component of a comprehensive MRSA colonization surveillance program. It is not intended to diagnose MRSA infection nor to guide or monitor treatment for MRSA infections. Test  performance is not FDA approved in patients less than 42 years old. Performed at [facility], [address] [place], [place] [address]   Peritoneal fluid culture w Gram Stain     Status: None (Preliminary result)   Collection Time: 06/19/21  5:48 PM   Specimen: Peritoneal Fluid  Result Value Ref Range Status   Specimen Description FLUID PERITONEAL  Final   Special Requests NONE  Final   Gram Stain   Final    NO WBC SEEN NO ORGANISMS SEEN Performed at [facility], [address], [place], [place] [address]    Culture PENDING  Incomplete   Report Status PENDING  Incomplete         Radiology Studies: DG CHEST PORT 1 VIEW  Result Date: 06/20/2021 CLINICAL DATA:  Wheezing EXAM: PORTABLE CHEST 1 VIEW COMPARISON:  Radiograph 02/16/2021 FINDINGS: Increasing bilateral perihilar opacities with cardiomegaly and indistinct pulmonary vascularity suggestive cardiogenic pulmonary edema. No visible layering pleural effusion. No pneumothorax. No acute abnormality of the chest wall. Telemetry leads overlie the chest. IMPRESSION: Increasing bilateral perihilar opacity compared to most  recent radiography. With cardiomegaly and indistinct pulmonary vascularity, suspect cardiogenic pulmonary edema though infection or ARDS could have this appearance in the appropriate clinical setting. Electronically Signed   By: Kreg ShropshirePrice  DeHay M.D.   On: 06/20/2021 00:37   Overnight EEG with video  Result Date: 06/18/2021 Charlsie QuestYadav, Priyanka O, MD     06/18/2021  1:52 PM Patient Name: Michelle HerKimmica Pieri MRN: [id number] Epilepsy Attending: Charlsie QuestPriyanka O Yadav Referring Physician/Provider: Dr Lindie SprucePriyanka Yadav Duration: 06/17/2021 2127 to 06/18/2021 1142  Patient history: 35yo F with AMS. EEG to evaluate for seizure.  Level of alertness:  awake, asleep  AEDs during EEG study: LEV  Technical aspects: This EEG study was done with scalp electrodes positioned according to the 10-20 International system of electrode placement.  Electrical activity was acquired at a sampling rate of 500Hz  and reviewed with a high frequency filter of 70Hz  and a low frequency filter of 1Hz . EEG data were recorded continuously and digitally stored.  Description: No clear posterior dominant rhythm was seen.  Sleep was characterized by sleep spindles (12 to 14 Hz), maximum frontocentral region. EEG showed continuous generalized 3 to 6 Hz theta-delta slowing. Intermittent generalized periodic discharges with triphasic morphology at  1-1.5Hz  were also noted, more prominent when awake/stimulated. Hyperventilation and photic stimulation were not performed.    ABNORMALITY - Periodic discharges with triphasic morphology, generalized ( GPDs) - Continuous slow, generalized  IMPRESSION: This study showed generalized periodic discharges with triphasic morphology at  1-1.5Hz  which is on the ictal-interictal continuum.  However, the morphology, frequency and reactivity is more commonly seen due to toxic-metabolic encephalopathy. Additionally, there is severe diffuse encephalopathy, nonspecific etiology but likely related to sedation, toxic-metabolic etiology. No seizures  were seen throughout the recording.  Charlsie Questriyanka O Yadav   ECHOCARDIOGRAM COMPLETE  Result Date: 06/18/2021    ECHOCARDIOGRAM REPORT   Patient Name:   Michelle Michelle [name] Date of Exam: 06/18/2021 Medical Rec #:  [id number]      Height:       64.0 in Accession #:    [id number]     Weight:       166.0 lb Date of Birth:  August 29, 1986      BSA:          1.807 m Patient Age:    35 years       BP:           95/60 mmHg Patient Gender: F              HR:           83 bpm. Exam Location:  Inpatient Procedure: 2D Echo, Cardiac Doppler and Color Doppler Indications:    Cirrhosis (HCC) [[id number]][[id number]]  History:        Patient has no prior history of Echocardiogram examinations.  Sonographer:    Eulah PontSarah Pirrotta RDCS Referring Phys: [id number] DOUGLAS B MCQUAID IMPRESSIONS  1. Left ventricular ejection fraction, by estimation, is 55 to 60%.  The left ventricle has normal function. The left ventricle has no regional wall motion abnormalities. Indeterminate diastolic filling due to E-A fusion.  2. Right ventricular systolic function is normal. The right ventricular size is normal. There is mildly elevated pulmonary artery systolic pressure.  3. Left atrial size was severely dilated.  4. Small pericardial effusion adjacent to the posterolateral myocardium.  5. The mitral valve is normal in structure. Mild mitral valve regurgitation. No evidence of mitral stenosis.  6. The aortic valve is tricuspid. Aortic valve regurgitation is not visualized. No aortic stenosis  is present.  7. The inferior vena cava is dilated in size with <50% respiratory variability, suggesting right atrial pressure of 15 mmHg. FINDINGS  Left Ventricle: Left ventricular ejection fraction, by estimation, is 55 to 60%. The left ventricle has normal function. The left ventricle has no regional wall motion abnormalities. The left ventricular internal cavity size was normal in size. There is  no left ventricular hypertrophy. Indeterminate diastolic filling due to E-A fusion. Right Ventricle: The right ventricular size is normal. No increase in right ventricular wall thickness. Right ventricular systolic function is normal. There is mildly elevated pulmonary artery systolic pressure. The tricuspid regurgitant velocity is 2.49  m/s, and with an assumed right atrial pressure of 15 mmHg, the estimated right ventricular systolic pressure is 39.8 mmHg. Left Atrium: Left atrial size was severely dilated. Right Atrium: Right atrial size was normal in size. Pericardium: Small pericardial effusion adjacent to the posterolateral myocardium. There is no evidence of pericardial effusion. Mitral Valve: The mitral valve is normal in structure. Mild mitral valve regurgitation, with posteriorly-directed jet. No evidence of mitral valve stenosis. Tricuspid Valve: The tricuspid valve is normal in structure.  Tricuspid valve regurgitation is mild . No evidence of tricuspid stenosis. Aortic Valve: The aortic valve is tricuspid. Aortic valve regurgitation is not visualized. No aortic stenosis is present. Pulmonic Valve: The pulmonic valve was normal in structure. Pulmonic valve regurgitation is not visualized. No evidence of pulmonic stenosis. Aorta: The aortic root is normal in size and structure. Venous: The inferior vena cava is dilated in size with less than 50% respiratory variability, suggesting right atrial pressure of 15 mmHg. IAS/Shunts: No atrial level shunt detected by color flow Doppler.  LEFT VENTRICLE PLAX 2D LVIDd:         5.00 cm  Diastology LVIDs:         3.50 cm  LV e' medial:    8.19 cm/s LV PW:         1.10 cm  LV E/e' medial:  13.4 LV IVS:        0.80 cm  LV e' lateral:   9.89 cm/s LVOT diam:     2.10 cm  LV E/e' lateral: 11.1 LV SV:         76 LV SV Index:   42 LVOT Area:     3.46 cm  RIGHT VENTRICLE RV S prime:     11.90 cm/s TAPSE (M-mode): 3.0 cm LEFT ATRIUM             Index       RIGHT ATRIUM           Index LA diam:        4.60 cm 2.55 cm/m  RA Area:     17.40 cm LA Vol (A2C):   83.7 ml 46.31 ml/m RA Volume:   50.70 ml  28.05 ml/m LA Vol (A4C):   83.7 ml 46.31 ml/m LA Biplane Vol: 87.3 ml 48.30 ml/m  AORTIC VALVE LVOT Vmax:   106.00 cm/s LVOT Vmean:  79.100 cm/s LVOT VTI:    0.220 m  AORTA Ao Root diam: 3.20 cm Ao Asc diam:  3.10 cm MITRAL VALVE                TRICUSPID VALVE MV Area (PHT): 4.39 cm     TR Peak grad:   24.8 mmHg MV Decel Time: 173 msec     TR Vmax:        249.00 cm/s MR PISA:  0.57 cm MR PISA Radius: 0.30 cm     SHUNTS MV E velocity: 110.00 cm/s  Systemic VTI:  0.22 m MV A velocity: 90.30 cm/s   Systemic Diam: 2.10 cm MV E/A ratio:  1.22 Chilton Si MD Electronically signed by Chilton Si MD Signature Date/Time: 06/18/2021/5:17:38 PM    Final    US Abdomen Limited RUQ (LIVER/GB)  Result Date: 06/18/2021 CLINICAL DATA:  Cirrhosis EXAM: ULTRASOUND  ABDOMEN LIMITED RIGHT UPPER QUADRANT COMPARISON:  None. FINDINGS: Gallbladder: No gallstones or wall thickening visualized. Sludge is present. No sonographic Murphy sign noted by sonographer. Common bile duct: Diameter: 2.5 mm, normal Liver: No focal lesion identified. Increased parenchymal echogenicity. Coarsening of echotexture. Nodular surface contour. Portal vein is patent on color Doppler imaging with reversal of normal direction of blood flow towards the liver. Other: Right pleural effusion.  Ascites. IMPRESSION: Cirrhosis with evidence of portal hypertension. There is reversal of flow in the portal vein. Gallbladder sludge. Ascites and right pleural effusion. Electronically Signed   By: Guadlupe Spanish M.D.   On: 06/18/2021 18:31        Scheduled Meds:  sodium chloride   Intravenous Once   sodium chloride   Intravenous Once   Chlorhexidine Gluconate Cloth  6 each Topical Daily   doxycycline  100 mg Oral Q12H   folic acid  1 mg Oral Daily   lactulose  20 g Oral BID   mouth rinse  15 mL Mouth Rinse BID   multivitamin with minerals  1 tablet Oral Daily   mupirocin ointment  1 application Nasal BID   thiamine  100 mg Oral Daily   Or   thiamine  100 mg Intravenous Daily   Continuous Infusions:  sodium chloride 10 mL/hr at 06/19/21 1731   potassium chloride 10 mEq (06/20/21 0656)     LOS: 3 days   Time spent= 35 mins    Michelle Fyfe Joline Maxcy, MD [facility]  If 7PM-7AM, please contact night-coverage  06/20/2021, 7:43 AM

## 2021-06-20 NOTE — Progress Notes (Signed)
Now fully awake, husband at bedside spoon feeding her. Tolerated 60 % of the food. Tolerated am dose po meds. Continue to monitor.

## 2021-06-20 NOTE — Progress Notes (Signed)
Date and time results received: 06/20/21 060  Test: K Critical Value: 2.7  Name of Provider Notified: Pola Corn  Orders Received? Or Actions Taken?: Orders Received - See Orders for details

## 2021-06-20 NOTE — Progress Notes (Signed)
Very lethargic, husband at bedside. Po meds not given , Md made aware.

## 2021-06-20 NOTE — Progress Notes (Addendum)
Daily Rounding Note  06/20/2021, 12:04 PM  LOS: 3 days   SUBJECTIVE:   Chief complaint: Alcoholic liver disease.  Cirrhosis, jaundice.  Anemia.  700 mL stool measured in flexiseal yesterday.   At least 1.7 mL urine yesterday.  Received Ativan per CIWA protocol overnight.  Po meds held this AM due to pt lethargy but then more alert.  Tolerating 60% of food, patient's spouse is feeding her.  Consumed nearly all of the food on tray.    OBJECTIVE:         Vital signs in last 24 hours:    Temp:  [97.4 F (36.3 C)-99.2 F (37.3 C)] 98.6 F (37 C) (07/28 1000) Pulse Rate:  [83-113] 92 (07/28 1100) Resp:  [17-36] 21 (07/28 1100) BP: (94-125)/(65-90) 102/80 (07/28 1100) SpO2:  [91 %-100 %] 100 % (07/28 1100) Weight:  [72.9 kg] 72.9 kg (07/28 0410) Last BM Date: 06/19/21 Filed Weights   06/18/21 0500 06/19/21 0500 06/20/21 0410  Weight: 75.3 kg 72.5 kg 72.9 kg   General: lethargic, arouseable   Heart: RRR Chest: clear bil.  No dyspnea Abdomen: slightly distended, slightly firm.  No tenderness. Watery, dark brown stool in flexi-seal.   Extremities: no CCE Neuro/Psych:  follows commands.  No tremors.  No asterixis  Intake/Output from previous day: 07/27 0701 - 07/28 0700 In: 915.1 [P.O.:720; I.V.:95; IV Piggyback:100.1] Out: 2400 [Urine:1700; Stool:700]  Intake/Output this shift: Total I/O In: 120 [P.O.:120] Out: -   Lab Results: Recent Labs    06/19/21 0456 06/19/21 2225 06/20/21 0456  WBC 13.4* 14.5* 15.5*  HGB 7.0* 7.8* 7.4*  HCT 19.3* 21.6* 21.4*  PLT 76* 76* 69*   BMET Recent Labs    06/18/21 0926 06/19/21 0456 06/20/21 0456  NA 136 136 132*  K 3.9 3.5 2.7*  CL 105 107 105  CO2 21* 20* 21*  GLUCOSE 114* 110* 106*  BUN 40* 38* 28*  CREATININE 2.58* 2.02* 1.25*  CALCIUM 7.4* 7.6* 7.5*   LFT Recent Labs    06/18/21 0223 06/19/21 0456 06/20/21 0456  PROT 7.2  7.1 7.4 7.2  ALBUMIN 1.2*   1.2* 1.6* 1.7*  AST 122*  121* 119* 115*  ALT 31  29 32 39  ALKPHOS 102  98 90 101  BILITOT 7.7*  7.5* 8.7* 8.2*  BILIDIR 3.9*  --   --   IBILI 3.8*  --   --    PT/INR Recent Labs    06/18/21 0223 06/18/21 0731  LABPROT 23.2* 23.2*  INR 2.1* 2.1*   Hepatitis Panel Recent Labs    06/18/21 1326  HEPBSAG NON REACTIVE  HCVAB NON REACTIVE  HEPAIGM NON REACTIVE  HEPBIGM NON REACTIVE    Studies/Results: DG CHEST PORT 1 VIEW  Result Date: 06/20/2021 CLINICAL DATA:  Wheezing EXAM: PORTABLE CHEST 1 VIEW COMPARISON:  Radiograph 02/16/2021 FINDINGS: Increasing bilateral perihilar opacities with cardiomegaly and indistinct pulmonary vascularity suggestive cardiogenic pulmonary edema. No visible layering pleural effusion. No pneumothorax. No acute abnormality of the chest wall. Telemetry leads overlie the chest. IMPRESSION: Increasing bilateral perihilar opacity compared to most recent radiography. With cardiomegaly and indistinct pulmonary vascularity, suspect cardiogenic pulmonary edema though infection or ARDS could have this appearance in the appropriate clinical setting. Electronically Signed   By: Kreg Shropshire M.D.   On: 06/20/2021 00:37   ECHOCARDIOGRAM COMPLETE  Result Date: 06/18/2021 IMPRESSIONS  1. Left ventricular ejection fraction, by estimation, is 55 to 60%. The left ventricle  has normal function. The left ventricle has no regional wall motion abnormalities. Indeterminate diastolic filling due to E-A fusion.  2. Right ventricular systolic function is normal. The right ventricular size is normal. There is mildly elevated pulmonary artery systolic pressure.  3. Left atrial size was severely dilated.  4. Small pericardial effusion adjacent to the posterolateral myocardium.  5. The mitral valve is normal in structure. Mild mitral valve regurgitation. No evidence of mitral stenosis.  6. The aortic valve is tricuspid. Aortic valve regurgitation is not visualized. No aortic stenosis is  present.  7. The inferior vena cava is dilated in size with <50% respiratory variability, suggesting right atrial pressure of 15 mmHg. Electronically signed by Chilton Si MD Signature Date/Time: 06/18/2021/5:17:38 PM    Final    US Abdomen Limited RUQ (LIVER/GB)  Result Date: 06/18/2021 CLINICAL DATA:  Cirrhosis EXAM: ULTRASOUND ABDOMEN LIMITED RIGHT UPPER QUADRANT COMPARISON:  None. FINDINGS: Gallbladder: No gallstones or wall thickening visualized. Sludge is present. No sonographic Murphy sign noted by sonographer. Common bile duct: Diameter: 2.5 mm, normal Liver: No focal lesion identified. Increased parenchymal echogenicity. Coarsening of echotexture. Nodular surface contour. Portal vein is patent on color Doppler imaging with reversal of normal direction of blood flow towards the liver. Other: Right pleural effusion.  Ascites. IMPRESSION: Cirrhosis with evidence of portal hypertension. There is reversal of flow in the portal vein. Gallbladder sludge. Ascites and right pleural effusion. Electronically Signed   By: Guadlupe Spanish M.D.   On: 06/18/2021 18:31    Scheduled Meds:  sodium chloride   Intravenous Once   sodium chloride   Intravenous Once   Chlorhexidine Gluconate Cloth  6 each Topical Daily   doxycycline  100 mg Oral Q12H   folic acid  1 mg Oral Daily   lactulose  20 g Oral BID   mouth rinse  15 mL Mouth Rinse BID   multivitamin with minerals  1 tablet Oral Daily   mupirocin ointment  1 application Nasal BID   potassium chloride  40 mEq Oral Q4H   thiamine  100 mg Oral Daily   Or   thiamine  100 mg Intravenous Daily   Continuous Infusions:  sodium chloride 10 mL/hr at 06/19/21 1731   PRN Meds:.acetaminophen, hydrALAZINE, ipratropium-albuterol, LORazepam **OR** LORazepam, senna-docusate   ASSESMENT:      Decompensated alcoholic liver disease with alcoholic hepatitis and cirrhosis. LFTs improving.       Ascites.  100 cc fluid removed.  Fluid studies not consistent with  SBP. SAAG: difficult to calculate accurately due to fluid albumin of <1.0.  depending on value chosen, scores as both c/w and not c/w portal htn.      AKI, ? HRS.  Urine Na <10.  Renal fx steadily improving.  Completed albumin infusions x 6.       Normocytic anemia.  5 PRBCs to date.  Hgb nadir 1.8 >> 7.4 today.  FOBT negative on 7/25.  Started Folic acid for folate deficiency.  No iron or B12 deficiency per labs 7/25.   Thrombocytopenia.  Platelets 69k.       Coagulopathy.  INR 2.1     AMS.  On Lactulose for HE.      Hypokalemia.  Po potassium administered.      Hyponatremia.      CAP, on doxycycline.     PLAN     Switched to 2 gm Na diet.      Leave flexi-seal in place for now but goal is to  remove ASAP, however for now w pt lethargy and watery BM, will leave in place.      Adding Rifaximin.      Jennye Moccasin  06/20/2021, 12:04 PM Phone [phone]    Attending physician's note   I have taken a history, examined the patient and reviewed the chart. I agree with the Advanced Practitioner's note, impression and recommendations.  80 yr F with alcoholic hepatitis and alcoholic cirrhosis admitted with severe anemia  Decompensated with ascites and hepatic encephalopathy  No evidence of active GI bleed  Meld sodium score 27  LFT are plateauing Creatinine trending down No evidence of SBP  Continue lactulose with goal 3 bowel movements per day Diet as tolerated with low-sodium Replete electrolytes Continue supportive care  I have spent 35 minutes of patient care (this includes precharting, chart review, review of results, face-to-face time used for counseling as well as treatment plan and follow-up. The patient was provided an opportunity to ask questions and all were answered. The patient agreed with the plan and demonstrated an understanding of the instructions.  Iona Beard , MD [phone]

## 2021-06-20 NOTE — Progress Notes (Signed)
PCCM Interval Note  Called to eval patient for increased WOB and hypoxemia on RA.   She was extubated 7/26, underwent para today and receiving albumin PRBC >> Hgb to 7.8 Also required ativan for some delirium and hallucinations earlier in the evening.   Today's Vitals   06/19/21 1856 06/19/21 1920 06/19/21 2100 06/19/21 2300  BP: 108/73 103/77 114/78 125/81  Pulse: (!) 101 100 (!) 109 (!) 113  Resp: (!) 32 (!) 23 (!) 36 (!) 25  Temp: 98.4 F (36.9 C) 98.3 F (36.8 C)  98.2 F (36.8 C)  TempSrc: Oral Oral  Oral  SpO2: 96% 96% 93% 92%  Weight:      Height:      PainSc:  0-No pain 0-No pain    Body mass index is 27.44 kg/m.;s On exam she wakes easily but is poorly responsive, will try to speak and answer questions. Has some UA noise and intermittent grunting. Bilateral insp crackles. Heart regular, abdomen distended and tender to palp diffusely. 2+ B LE edema. Rectal tube in place.   CXR now reviewed >> progression of bilateral infiltrates compared with 7/26   Impression:  Multifactorial acute respiratory failure. Appears to be in part due to suppressed MS and UA stability. Also with progressive B infiltrates on CXR. ? Evolving TR-ALI or ARDS due to PNA, ? Other cause pulm edema. Her anemia appears to be stabilized, consider evolving metabolic acidosis, but most recent BMP reassuring.   Plan: -O2 per NS -Hold the rest of her albumin resuscitation and give lasix x 1 now; will have to be cautious given her renal failure -avoid any further ativan if possible to avoid suppressing MS. Airway protection an issue here -she may require intubation, more for airway protection than for hypoxemia.  -repeat CXR, ABG in the am  Critical Care time 32 minutes  Levy Pupa, MD, PhD 06/20/2021, 12:54 AM [facility] [phone] or if no answer before 7:00PM call [phone] For any issues after 7:00PM please call eLink [phone]

## 2021-06-20 NOTE — Progress Notes (Signed)
[facility] ADULT ICU REPLACEMENT PROTOCOL   The patient does apply for the [facility] Adult ICU Electrolyte Replacment Protocol based on the criteria listed below:   1.Exclusion criteria: TCTS patients, ECMO patients and Hypothermia Protocol, and   Dialysis patients 2. Is GFR >/= 30 ml/min? Yes.    Patient's GFR today is 57 3. Is SCr </= 2? No. Patient's SCr is 1.25 mg/dL 4. Did SCr increase >/= 0.5 in 24 hours? No. 5.Pt's weight >40kg  Yes.   6. Abnormal electrolyte(s): K+ 2.7  7. Electrolytes replaced per protocol 8.  Call MD STAT for K+ </= 2.5, Phos </= 1, or Mag </= 1 Physician:  n/a  Melvern Banker 06/20/2021 5:55 AM

## 2021-06-20 NOTE — Progress Notes (Signed)
eLink Physician-Brief Progress Note Patient Name: Michelle [name] DOB: May 21, 1986 MRN: [id number]   Date of Service  06/20/2021  HPI/Events of Note  Nursing reports wheezing, RR = 28-40, HR = 119. Sat = 89%. Room not equipt with camera.   eICU Interventions  Plan: Albuterol 2.5 mg via neb now and Q 3 hours PRN wheezing or SOB. Portable CXR STAT. [place] O2 to keep sat >= 92%. Will request PCCM ground team to evaluate the patient at bedside.     Intervention Category Major Interventions: Other:  Michelle [name] 06/20/2021, 12:10 AM

## 2021-06-20 NOTE — Progress Notes (Addendum)
Inpatient Rehabilitation Admissions Coordinator  I met with patient at bedside for rehab assessment. She was fully asleep . Boyfriend at bedside. They have moved here from [place] and have lived at [facility] since June. They have family here but unable to live in their homes. He is at her bedside daily during visiting hours to assist. Patient currently not at a level for intensive inpatient rehab level therapies and she has no permanent address for dispo beyond shelter. I advised boyfriend to reach out to family and friends to define disposition options and assistance. I will follow her progress.  Danne Baxter, RN, MSN Rehab Admissions Coordinator [phone] 06/20/2021 2:52 PM   I advised TOC team of need for other rehab venue options.  Danne Baxter, RN, MSN Rehab Admissions Coordinator [phone] 06/21/2021 10:29 AM'

## 2021-06-20 NOTE — Progress Notes (Signed)
CCM Brief Progress Note  35yo F admitted 7/25 with etoh abuse, cirrhosis, AMS, severe anemia, respiratory failure requiring intubation who was extubated 7/26, underwent transferred out of ICU 7/27 after 7/27 paracentesis.   CCM asked to see 7/28 overnight due to AMS and hypoxia. CXR with pulm edema, mental status likely attributable to ativan administration.  Received lasix and supplemental O2.  7/28 AM -- pt mental status is improving compared to night CCM note. She is lethargic, but following commands and protecting her airway.Hypoxia is improving, she remains on low amount of [place] but SpO2 98-100% (pt requests  remain on).   Stable to remain under TRH care with CCM signed off.    Tessie Fass MSN, AGACNP-BC [facility] Amion for pager  06/20/2021, 7:58 AM

## 2021-06-20 NOTE — Progress Notes (Signed)
   06/20/21 0800  Assess: MEWS Score  BP 118/79  Pulse Rate (!) 101  ECG Heart Rate (!) 102  Resp (!) 28  Level of Consciousness Responds to Voice  SpO2 99 %  O2 Device Nasal Cannula  O2 Flow Rate (L/min) 2 L/min  Assess: MEWS Score  MEWS Temp 0  MEWS Systolic 0  MEWS Pulse 1  MEWS RR 2  MEWS LOC 1  MEWS Score 4  MEWS Score Color Red  Assess: if the MEWS score is Yellow or Red  Were vital signs taken at a resting state? Yes  Focused Assessment No change from prior assessment  Early Detection of Sepsis Score *See Row Information* Low  MEWS guidelines implemented *See Row Information* No, previously yellow, continue vital signs every 4 hours  Treat  Facial Expression 0  Body Movements 0  Muscle Tension 0  Compliance with ventilator (intubated pts.) N/A  Vocalization (extubated pts.) 0  CPOT Total 0  Take Vital Signs  Increase Vital Sign Frequency  Yellow: Q 2hr X 2 then Q 4hr X 2, if remains yellow, continue Q 4hrs;Red: Q 1hr X 4 then Q 4hr X 4, if remains red, continue Q 4hrs  Escalate  MEWS: Escalate Red: discuss with charge nurse/RN and provider, consider discussing with RRT  Notify: Charge Nurse/RN  Name of Charge Nurse/RN Notified laura  Date Charge Nurse/RN Notified 06/20/21  Time Charge Nurse/RN Notified [id number]  Notify: Provider  Provider Name/Title DR. Amin  Date Provider Notified 06/20/21  Time Provider Notified 1016  Notification Type Face-to-face  Notification Reason Change in status  Provider response At bedside  Date of Provider Response 06/20/21  Time of Provider Response 1000  Document  Patient Outcome Not stable and remains on department  Progress note created (see row info) Yes

## 2021-06-21 LAB — CBC
HCT: 26.1 % — ABNORMAL LOW (ref 36.0–46.0)
Hemoglobin: 9 g/dL — ABNORMAL LOW (ref 12.0–15.0)
MCH: 33 pg (ref 26.0–34.0)
MCHC: 34.5 g/dL (ref 30.0–36.0)
MCV: 95.6 fL (ref 80.0–100.0)
Platelets: 79 10*3/uL — ABNORMAL LOW (ref 150–400)
RBC: 2.73 MIL/uL — ABNORMAL LOW (ref 3.87–5.11)
RDW: 24.9 % — ABNORMAL HIGH (ref 11.5–15.5)
WBC: 16.7 10*3/uL — ABNORMAL HIGH (ref 4.0–10.5)
nRBC: 5.5 % — ABNORMAL HIGH (ref 0.0–0.2)

## 2021-06-21 LAB — COMPREHENSIVE METABOLIC PANEL
ALT: 44 U/L (ref 0–44)
AST: 124 U/L — ABNORMAL HIGH (ref 15–41)
Albumin: 1.6 g/dL — ABNORMAL LOW (ref 3.5–5.0)
Alkaline Phosphatase: 103 U/L (ref 38–126)
Anion gap: 7 (ref 5–15)
BUN: 18 mg/dL (ref 6–20)
CO2: 21 mmol/L — ABNORMAL LOW (ref 22–32)
Calcium: 7.7 mg/dL — ABNORMAL LOW (ref 8.9–10.3)
Chloride: 104 mmol/L (ref 98–111)
Creatinine, Ser: 0.83 mg/dL (ref 0.44–1.00)
GFR, Estimated: >60 mL/min (ref >60)
Glucose, Bld: 98 mg/dL (ref 70–99)
Potassium: 3.8 mmol/L (ref 3.5–5.1)
Sodium: 132 mmol/L — ABNORMAL LOW (ref 135–145)
Total Bilirubin: 9.6 mg/dL — ABNORMAL HIGH (ref 0.3–1.2)
Total Protein: 7.5 g/dL (ref 6.5–8.1)

## 2021-06-21 LAB — GLUCOSE, CAPILLARY
Glucose-Capillary: 90 mg/dL (ref 70–99)
Glucose-Capillary: 96 mg/dL (ref 70–99)

## 2021-06-21 LAB — AMMONIA: Ammonia: 31 umol/L (ref 9–35)

## 2021-06-21 LAB — METHYLMALONIC ACID, SERUM: Methylmalonic Acid, Quantitative: 270 nmol/L (ref 0–378)

## 2021-06-21 LAB — MAGNESIUM: Magnesium: 1.7 mg/dL (ref 1.7–2.4)

## 2021-06-21 MED ORDER — LACTULOSE 10 GM/15ML PO SOLN
10.0000 g | Freq: Two times a day (BID) | ORAL | Status: DC
  Administered 2021-06-21 – 2021-06-26 (×10): 10 g via ORAL
  Filled 2021-06-21 (×10): qty 15

## 2021-06-21 MED ORDER — FUROSEMIDE 10 MG/ML IJ SOLN
40.0000 mg | Freq: Every day | INTRAMUSCULAR | Status: DC
  Administered 2021-06-21: 40 mg via INTRAVENOUS
  Filled 2021-06-21: qty 4

## 2021-06-21 NOTE — Progress Notes (Signed)
Physical Therapy Treatment Patient Details Name: Michelle Little MRN: [id number] DOB: 07-Jan-1986 Today's Date: 06/21/2021    History of Present Illness 35 y.o. female presenting to ED 7/25 with decreased responsiveness, poor p.o. intake, jaundice and BLE swelling. Patient admitted with acute hepatic encephalopathy/hyperammonemia, decompensated liver failure, suspected seizure, acute respiratory failure requiring mechanical ventilation secondary pulmonary edema +/- aspiration pneumonia s/p extubation 7/25, acute on chronic blood loss secondary to anemia septic shock and ETOH withdrawl. PMHx significant for ETOH use disorder and IDA.    PT Comments    Pt slow, but steadily working toward goals,  Limited today by rectal tube and pain.  Emphasis on scooting and preparation for transfers, sit to stand, standing balance in RW (trying to get w/shift forward over her feet) and progression of gait stability/distance in the RW.    Follow Up Recommendations  Home health PT;Other (comment);Supervision/Assistance - 24 hour (TBA,)     Equipment Recommendations  Rolling walker with 5" wheels    Recommendations for Other Services       Precautions / Restrictions Precautions Precautions: Fall    Mobility  Bed Mobility Overal bed mobility: Needs Assistance Bed Mobility: Sit to Supine       Sit to supine: Mod assist;+2 for physical assistance   General bed mobility comments: pt attempt scooting/pivoting to prep for laying down.  Pt needed truncal support and LE assist    Transfers Overall transfer level: Needs assistance Equipment used: Rolling walker (2 wheeled) Transfers: Sit to/from Stand Sit to Stand: Max assist;+2 physical assistance;+2 safety/equipment         General transfer comment: cues for scooting/coming forward.  Assist forward and boost.  Pt with significant posterior bias.  Ambulation/Gait Ambulation/Gait assistance: Max assist;+2 safety/equipment Gait Distance (Feet):  12 Feet Assistive device: Rolling walker (2 wheeled) Gait Pattern/deviations: Step-to pattern;Step-through pattern     General Gait Details: signifcant posterior bias.  Even when ambulating, she never got her BOS over her feet throughout.  Very short, weak, guarded steps   Stairs             Wheelchair Mobility    Modified Rankin (Stroke Patients Only)       Balance Overall balance assessment: Needs assistance Sitting-balance support: No upper extremity supported;Single extremity supported;Feet supported Sitting balance-Leahy Scale: Poor     Standing balance support: Bilateral upper extremity supported Standing balance-Leahy Scale: Poor Standing balance comment: significant posterior bias, significant external support                            Cognition Arousal/Alertness: Awake/alert Behavior During Therapy: WFL for tasks assessed/performed Overall Cognitive Status: Within Functional Limits for tasks assessed                                        Exercises      General Comments General comments (skin integrity, edema, etc.): vss overall      Pertinent Vitals/Pain Pain Assessment: Faces Faces Pain Scale: Hurts even more Pain Location: rectal tube/buttock/general Pain Descriptors / Indicators: Sore Pain Intervention(s): Monitored during session;Limited activity within patient's tolerance    Home Living                      Prior Function            PT Goals (current goals can now be  found in the care plan section) Acute Rehab PT Goals Patient Stated Goal: be able to walk out of here PT Goal Formulation: With patient Time For Goal Achievement: 07/03/21 Potential to Achieve Goals: Good Progress towards PT goals: Progressing toward goals    Frequency    Min 3X/week      PT Plan Current plan remains appropriate    Co-evaluation PT/OT/SLP Co-Evaluation/Treatment: Yes Reason for Co-Treatment: For  patient/therapist safety PT goals addressed during session: Mobility/safety with mobility        AM-PAC PT "6 Clicks" Mobility   Outcome Measure  Help needed turning from your back to your side while in a flat bed without using bedrails?: A Lot Help needed moving from lying on your back to sitting on the side of a flat bed without using bedrails?: A Lot Help needed moving to and from a bed to a chair (including a wheelchair)?: A Lot Help needed standing up from a chair using your arms (e.g., wheelchair or bedside chair)?: A Lot Help needed to walk in hospital room?: A Lot Help needed climbing 3-5 steps with a railing? : A Lot 6 Click Score: 12    End of Session   Activity Tolerance: Patient limited by fatigue Patient left: in bed;with call bell/phone within reach Nurse Communication: Mobility status PT Visit Diagnosis: Muscle weakness (generalized) (M62.81);Difficulty in walking, not elsewhere classified (R26.2)     Time: 0981-1914 PT Time Calculation (min) (ACUTE ONLY): 35 min  Charges:  $Gait Training: 8-22 mins                     06/21/2021  Michelle Little., PT Acute Rehabilitation Services [phone]  (pager) [phone]  (office)   Michelle Little Michelle Little 06/21/2021, 12:12 PM

## 2021-06-21 NOTE — Plan of Care (Signed)

## 2021-06-21 NOTE — Progress Notes (Signed)
Occupational Therapy Treatment Patient Details Name: Michelle Little MRN: [id number] DOB: Sep 15, 1986 Today's Date: 06/21/2021    History of present illness 35 y.o. female presenting to ED 7/25 with decreased responsiveness, poor p.o. intake, jaundice and BLE swelling. Patient admitted with acute hepatic encephalopathy/hyperammonemia, decompensated liver failure, suspected seizure, acute respiratory failure requiring mechanical ventilation secondary pulmonary edema +/- aspiration pneumonia s/p extubation 7/25, acute on chronic blood loss secondary to anemia septic shock and ETOH withdrawl. PMHx significant for ETOH use disorder and IDA.   OT comments  Pt limited by rectal pain from flexiseal, also noted to have open area on her buttocks. Requires +2 max assist to stand, but was able to ambulate with RW in room with close chair following. Pt ultimately chose to return to bed to get off her backside.CIR is not an option for d/c due to pt living in shelter, will work towards home with [facility].   Follow Up Recommendations  Home health OT;Supervision/Assistance - 24 hour (to continue to be assessed)    Equipment Recommendations  3 in 1 bedside commode    Recommendations for Other Services      Precautions / Restrictions Precautions Precautions: Fall Precaution Comments: flexiseal, watch HR       Mobility Bed Mobility Overal bed mobility: Needs Assistance Bed Mobility: Sit to Supine       Sit to supine: Mod assist;+2 for physical assistance   General bed mobility comments: pt attempt scooting/pivoting to prep for laying down.  Pt needed truncal support and LE assist    Transfers Overall transfer level: Needs assistance Equipment used: Rolling walker (2 wheeled) Transfers: Sit to/from Stand Sit to Stand: Max assist;+2 physical assistance;+2 safety/equipment         General transfer comment: cues for scooting/coming forward.  Assist forward and boost.  Pt with significant posterior  bias.    Balance Overall balance assessment: Needs assistance Sitting-balance support: No upper extremity supported;Single extremity supported;Feet supported Sitting balance-Leahy Scale: Poor     Standing balance support: Bilateral upper extremity supported Standing balance-Leahy Scale: Poor Standing balance comment: significant posterior bias, significant external support                           ADL either performed or assessed with clinical judgement   ADL Overall ADL's : Needs assistance/impaired                 Upper Body Dressing : Maximal assistance;Sitting Upper Body Dressing Details (indicate cue type and reason): sweatshir                 Functional mobility during ADLs: +2 for safety/equipment;Moderate assistance;Rolling walker;Cueing for sequencing       Vision       Perception     Praxis      Cognition Arousal/Alertness: Awake/alert Behavior During Therapy: Anxious (can likely become agitated if not in control) Overall Cognitive Status: Within Functional Limits for tasks assessed                                          Exercises     Shoulder Instructions       General Comments vss overall    Pertinent Vitals/ Pain       Pain Assessment: Faces Faces Pain Scale: Hurts even more Pain Location: rectal tube/buttock/general Pain Descriptors / Indicators: Sore Pain  Intervention(s): Monitored during session;Repositioned  Home Living                                          Prior Functioning/Environment              Frequency  Min 2X/week        Progress Toward Goals  OT Goals(current goals can now be found in the care plan section)  Progress towards OT goals: Progressing toward goals  Acute Rehab OT Goals Patient Stated Goal: be able to walk out of here OT Goal Formulation: With patient Time For Goal Achievement: 07/03/21 Potential to Achieve Goals: Good  Plan Discharge  plan needs to be updated    Co-evaluation    PT/OT/SLP Co-Evaluation/Treatment: Yes Reason for Co-Treatment: For patient/therapist safety PT goals addressed during session: Mobility/safety with mobility OT goals addressed during session: ADL's and self-care;Strengthening/ROM      AM-PAC OT "6 Clicks" Daily Activity     Outcome Measure   Help from another person eating meals?: A Little Help from another person taking care of personal grooming?: A Little Help from another person toileting, which includes using toliet, bedpan, or urinal?: Total Help from another person bathing (including washing, rinsing, drying)?: A Lot Help from another person to put on and taking off regular upper body clothing?: A Lot Help from another person to put on and taking off regular lower body clothing?: Total 6 Click Score: 12    End of Session Equipment Utilized During Treatment: Rolling walker;Gait belt  OT Visit Diagnosis: Unsteadiness on feet (R26.81);Other abnormalities of gait and mobility (R26.89);Muscle weakness (generalized) (M62.81)   Activity Tolerance Patient limited by pain   Patient Left in bed;with call bell/phone within reach;with bed alarm set   Nurse Communication Mobility status        Time: 3545-6256 OT Time Calculation (min): 35 min  Charges: OT General Charges $OT Visit: 1 Visit OT Treatments $Therapeutic Activity: 8-22 mins  Martie Round, OTR/L Acute Rehabilitation Services Pager: [phone] Office: [phone]    Evern Bio 06/21/2021, 12:32 PM

## 2021-06-21 NOTE — Progress Notes (Addendum)
Daily Rounding Note  06/21/2021, 1:07 PM  LOS: 4 days   SUBJECTIVE:   Chief complaint: Decompensated liver disease.  No nausea or vomiting.  No belly pain.  Consuming 30 to 50% of meals.  At least 600 mL of stool yesterday, continues to be dark, greenish, watery.  OBJECTIVE:         Vital signs in last 24 hours:    Temp:  [98 F (36.7 C)-98.8 F (37.1 C)] 98 F (36.7 C) (07/29 1209) Pulse Rate:  [90-101] 101 (07/29 1209) Resp:  [18-35] 23 (07/29 1209) BP: (101-128)/(60-84) 101/75 (07/29 1209) SpO2:  [97 %-100 %] 98 % (07/29 1209) Weight:  [78.1 kg] 78.1 kg (07/29 0435) Last BM Date: 06/21/21 Filed Weights   06/19/21 0500 06/20/21 0410 06/21/21 0435  Weight: 72.5 kg 72.9 kg 78.1 kg   General: Looks chronically ill.  More alert.  Comfortable in the bed currently Heart: RRR Chest: Clear bilaterally Abdomen: Soft without tenderness.  Active bowel sounds. Extremities: No CCE. Neuro/Psych: Oriented x3.  Psychomotor, speech slowing.  Moves all 4 limbs.  Slight asterixis.  Intake/Output from previous day: 07/28 0701 - 07/29 0700 In: 840 [P.O.:840] Out: 2000 [Urine:1400; Stool:600]  Intake/Output this shift: No intake/output data recorded.  Lab Results: Recent Labs    06/20/21 0456 06/20/21 1610 06/21/21 0601  WBC 15.5* 16.3* 16.7*  HGB 7.4* 8.3* 9.0*  HCT 21.4* 23.4* 26.1*  PLT 69* 71* 79*   BMET Recent Labs    06/20/21 0456 06/20/21 1610 06/21/21 0601  NA 132* 133* 132*  K 2.7* 3.5 3.8  CL 105 106 104  CO2 21* 20* 21*  GLUCOSE 106* 117* 98  BUN 28* 24* 18  CREATININE 1.25* 0.96 0.83  CALCIUM 7.5* 7.4* 7.7*   LFT Recent Labs    06/19/21 0456 06/20/21 0456 06/21/21 0601  PROT 7.4 7.2 7.5  ALBUMIN 1.6* 1.7* 1.6*  AST 119* 115* 124*  ALT 32 39 44  ALKPHOS 90 101 103  BILITOT 8.7* 8.2* 9.6*   PT/INR No results for input(s): LABPROT, INR in the last 72 hours. Hepatitis Panel Recent Labs     06/18/21 1326  HEPBSAG NON REACTIVE  HCVAB NON REACTIVE  HEPAIGM NON REACTIVE  HEPBIGM NON REACTIVE    Studies/Results: DG CHEST PORT 1 VIEW  Result Date: 06/20/2021 CLINICAL DATA:  Wheezing EXAM: PORTABLE CHEST 1 VIEW COMPARISON:  Radiograph 02/16/2021 FINDINGS: Increasing bilateral perihilar opacities with cardiomegaly and indistinct pulmonary vascularity suggestive cardiogenic pulmonary edema. No visible layering pleural effusion. No pneumothorax. No acute abnormality of the chest wall. Telemetry leads overlie the chest. IMPRESSION: Increasing bilateral perihilar opacity compared to most recent radiography. With cardiomegaly and indistinct pulmonary vascularity, suspect cardiogenic pulmonary edema though infection or ARDS could have this appearance in the appropriate clinical setting. Electronically Signed   By: Kreg Shropshire M.D.   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  Acute hepatitis serologies negative.  LFTs bumped up a bit today.  Not a candidate for liver transplant should it come to that.     Ascites.  100 cc fluid removed.  Fluid studies not consistent with SBP. SAAG: difficult to calculate accurately due to fluid albumin of <1.0.  depending on value chosen, scores as both c/w and not c/w portal htn.       AKI, ? HRS.  Urine Na <10.  Renal function now entirely within normal limits.   Completed albumin infusions x 6.        Normocytic anemia.  5 PRBCs to date.  Hgb nadir 1.8 >> 7.4 today.  FOBT negative on 7/25.  Started Folic acid for folate deficiency.  No iron or B12 deficiency per labs 7/25. Hgb steadily improving.     Thrombocytopenia.  Platelets improved.        Coagulopathy.  INR 2.1.        AMS.  On Lactulose for HE, dose decreased yesterday and again today. Continues w watery dark brown/greenish stool into flexiseal.    Rifaximin added 7/28.  Mental status improved.  Still some asterixis.       Hypokalemia.  Po potassium administered.       Hyponatremia.       CAP, on doxycycline.    PLAN      Will follow peripherally.  Call if concerns.  Reinforce complete alcohol abstinence going forward.    Jennye Moccasin  06/21/2021, 1:07 PM Phone [phone]     Attending physician's note   I have taken an interval history, reviewed the chart and examined the patient. I agree with the Advanced Practitioner's note, impression and recommendations.   Doing much better Tolerating p.o. well.   Have discussed alcohol abstinence. Needs to continue lactulose as outpatient.  Not sure if she can afford rifaximin.  Will try to get her into patient assistance program at follow-up Would need Lasix/Aldactone at discharge  Will sign off for now. Remaining GI eval as outpatient.   Edman Circle, MD [facility] [phone]

## 2021-06-21 NOTE — Plan of Care (Signed)
  Problem: Education: Goal: Knowledge of General Education information will improve Description Including pain rating scale, medication(s)/side effects and non-pharmacologic comfort measures Outcome: Progressing   Problem: Health Behavior/Discharge Planning: Goal: Ability to manage health-related needs will improve Outcome: Progressing   Problem: Clinical Measurements: Goal: Respiratory complications will improve Outcome: Progressing   Problem: Activity: Goal: Risk for activity intolerance will decrease Outcome: Progressing   Problem: Nutrition: Goal: Adequate nutrition will be maintained Outcome: Progressing   Problem: Coping: Goal: Level of anxiety will decrease Outcome: Progressing   

## 2021-06-21 NOTE — Progress Notes (Addendum)
PROGRESS NOTE  Michelle Little  UJW:[id number] DOB: 03-10-1986 DOA: 06/17/2021 PCP: Pcp, No   Brief Narrative: Michelle Little is a 35 y.o. female with a history of EtOH cirrhosis, tobacco use who presented from [facility] due to somnolence, unresponsiveness on 7/25 requiring intubation on arrival, subsequently extubated 7/26. Initial hemoglobin noted to be 1.8 for which 5u PRBCs were given and GI consulted, though FOBT was negative. Treatment with lactulose and rifaximin for hepatic encephalopathy complicated by diarrhea has improved mentation significantly. Paracentesis 7/27 showed no SBP, transudate consistent with portal HTN. Doxycycline has been given for pneumonia and lasix has been given for persistent volume overload.   Assessment & Plan: Active Problems:   Acute encephalopathy   Anemia   Hepatic encephalopathy (HCC)   Acute renal failure (HCC)   Other ascites   Cirrhosis (HCC)  Acute hepatic encephalopathy: Improving. CT head nonacute.  - Continue rifaximin. Given severity of diarrhea and improved mentation, will deescalate lactulose. Attempt to DC rectal tube soon. - Delirium precautions to continue  Decompensated alcoholic cirrhosis with ascites, hypoalbuminemia, thrombocytopenia, coagulopathy: MELD-Na score is 27. Hepatitis panel negative.  - s/p paracentesis 7/27 by PCCM negative for SBP. Ascitic fluid albumin <1.  - Give lasix 31m IV x1, monitor metabolic parameters, I/O, weight, clinical exam. Will need to add spironolactone as well either per GI or after acute diuresis needs met.   Symptomatic macrocytic anemia: FOBT negative. Ferritin 52, iron 41, TIBC low 193. B12 1,496.  - Hgb stable after 5u PRBCs on 7/25. - No endoscopic evaluation currently planned.    Folic acid deficiency:  - Continue supplementation.   CAP:  - Continue 5 days abx w/doxycycline. No hypoxia.   Lactic acidosis: Resolved, level was 10.   Hypokalemia - Continue monitoring and  supplementation.   AKI: Resolved with fluid + albumin (pre-renal FENa), now with evidence of overload so attempting diuresis as above.  - Monitor metabolic parameters, avoid nephrotoxins.    Asymptomatic bacteriuria - Urine cultures are negative.   Alcohol abuse: Given ativan for possible withdrawal earlier in hospitalization which caused AMS. No evidence of withdrawal at this time.  - Avoid ativan, etc.  - Cessation counseling  No glycemic excursions, stop checking CBGs.   DVT prophylaxis: SCDs Code Status: Full Family Communication: Spouse at bedside Disposition Plan:  Status is: Inpatient  Remains inpatient appropriate because:Inpatient level of care appropriate due to severity of illness  Dispo: The patient is from: Home              Anticipated d/c is to: Home              Patient currently is not medically stable to d/c.   Difficult to place patient No  Consultants:  GI PCCM  Procedures:  Paracentesis 7/27  Antimicrobials: Doxycycline   Subjective: Husband at bedside reports her mentation has vastly improved, still with discomfort around rectal tube but stool is liquid in bag, filling it up frequently. No abd pain, vomiting. No bleeding.   Objective: Vitals:   06/20/21 1600 06/20/21 1927 06/20/21 2346 06/21/21 0435  BP: 110/78 113/74 119/84 128/81  Pulse: 90 (!) 101 98 94  Resp: 19 (!) 35 (!) 22 (!) 21  Temp:  98.5 F (36.9 C) 98.8 F (37.1 C) 98.1 F (36.7 C)  TempSrc:  Oral Oral Oral  SpO2: 100% 100% 98% 97%  Weight:    78.1 kg  Height:        Intake/Output Summary (Last 24 hours) at  06/21/2021 0958 Last data filed at 06/21/2021 0430 Gross per 24 hour  Intake 720 ml  Output 2000 ml  Net -1280 ml   Filed Weights   06/19/21 0500 06/20/21 0410 06/21/21 0435  Weight: 72.5 kg 72.9 kg 78.1 kg    Gen: Chronically ill-appearing female in no distress Pulm: Non-labored breathing. Clear to auscultation bilaterally.  CV: Regular rate and rhythm. No  murmur, rub, or gallop. No JVD, 2+ pitting pedal edema. GI: Abdomen soft, distended but nontender, not tympanitic. +BS Ext: Warm, no deformities Skin: No rashes, lesions or ulcers Neuro: Alert, conversant, slowed mentation but appropriate and oriented. No focal neurological deficits. No asterixis. Psych: Judgement and insight appear fair. Mood & affect appropriate.   Data Reviewed: I have personally reviewed following labs and imaging studies  CBC: Recent Labs  Lab 06/17/21 1015 06/17/21 1647 06/19/21 0456 06/19/21 2225 06/20/21 0456 06/20/21 1610 06/21/21 0601  WBC NO RESULT, LAB ERROR, SEE [id number]  18.8*   < > 13.4* 14.5* 15.5* 16.3* 16.7*  NEUTROABS Not Measured  14.4*  --   --   --   --   --   --   HGB NO RESULT, LAB ERROR, SEE [id number]  1.8*   < > 7.0* 7.8* 7.4* 8.3* 9.0*  HCT Not Measured  7.3*   < > 19.3* 21.6* 21.4* 23.4* 26.1*  MCV Not Measured  125.9*   < > 90.6 93.5 91.5 92.9 95.6  PLT Not Measured  137*   < > 76* 76* 69* 71* 79*   < > = values in this interval not displayed.   Basic Metabolic Panel: Recent Labs  Lab 06/18/21 0223 06/18/21 0926 06/19/21 0456 06/19/21 2225 06/20/21 0456 06/20/21 1610 06/21/21 0601  NA 134* 136 136  --  132* 133* 132*  K 3.9 3.9 3.5  --  2.7* 3.5 3.8  CL 105 105 107  --  105 106 104  CO2 20* 21* 20*  --  21* 20* 21*  GLUCOSE 131* 114* 110*  --  106* 117* 98  BUN 39* 40* 38*  --  28* 24* 18  CREATININE 2.80* 2.58* 2.02*  --  1.25* 0.96 0.83  CALCIUM 7.6* 7.4* 7.6*  --  7.5* 7.4* 7.7*  MG 1.5*  --   --  2.2 2.0  --  1.7  PHOS  --   --   --  2.6  --   --   --    GFR: Estimated Creatinine Clearance: 93.9 mL/min (by C-G formula based on SCr of 0.83 mg/dL). Liver Function Tests: Recent Labs  Lab 06/17/21 1015 06/18/21 0223 06/19/21 0456 06/20/21 0456 06/21/21 0601  AST 125* 122*  121* 119* 115* 124*  ALT 26 31  29  32 39 44  ALKPHOS 117 102  98 90 101 103  BILITOT 5.9* 7.7*  7.5* 8.7* 8.2* 9.6*  PROT 8.0 7.2   7.1 7.4 7.2 7.5  ALBUMIN 1.3* 1.2*  1.2* 1.6* 1.7* 1.6*   No results for input(s): LIPASE, AMYLASE in the last 168 hours. Recent Labs  Lab 06/17/21 1402 06/18/21 0223 06/21/21 0601  AMMONIA 82* 51* 31   Coagulation Profile: Recent Labs  Lab 06/17/21 1015 06/18/21 0223 06/18/21 0731  INR 2.3* 2.1* 2.1*   Cardiac Enzymes: No results for input(s): CKTOTAL, CKMB, CKMBINDEX, TROPONINI in the last 168 hours. BNP (last 3 results) No results for input(s): PROBNP in the last 8760 hours. HbA1C: No results for input(s): HGBA1C in the last 72 hours. CBG:  Recent Labs  Lab 06/20/21 1642 06/20/21 1952 06/20/21 2350 06/21/21 0442 06/21/21 0721  GLUCAP 116* 106* 103* 90 96   Lipid Profile: No results for input(s): CHOL, HDL, LDLCALC, TRIG, CHOLHDL, LDLDIRECT in the last 72 hours. Thyroid Function Tests: Recent Labs    06/18/21 1326  TSH 0.425   Anemia Panel: No results for input(s): VITAMINB12, FOLATE, FERRITIN, TIBC, IRON, RETICCTPCT in the last 72 hours. Urine analysis:    Component Value Date/Time   COLORURINE AMBER (A) 06/17/2021 1011   APPEARANCEUR HAZY (A) 06/17/2021 1011   LABSPEC 1.020 06/17/2021 1011   PHURINE 5.0 06/17/2021 1011   GLUCOSEU NEGATIVE 06/17/2021 1011   HGBUR NEGATIVE 06/17/2021 1011   BILIRUBINUR LARGE (A) 06/17/2021 1011   KETONESUR 15 (A) 06/17/2021 1011   PROTEINUR 30 (A) 06/17/2021 1011   NITRITE NEGATIVE 06/17/2021 1011   LEUKOCYTESUR MODERATE (A) 06/17/2021 1011   Recent Results (from the past 240 hour(s))  Urine Culture     Status: None   Collection Time: 06/17/21 10:11 AM   Specimen: In/Out Cath Urine  Result Value Ref Range Status   Specimen Description IN/OUT CATH URINE  Final   Special Requests NONE  Final   Culture   Final    NO GROWTH Performed at [facility], [place] [address] [place], [place] [address]    Report Status 06/19/2021 FINAL  Final  Blood Culture (routine x 2)     Status: None (Preliminary result)    Collection Time: 06/17/21 10:15 AM   Specimen: BLOOD LEFT FOREARM  Result Value Ref Range Status   Specimen Description BLOOD LEFT FOREARM  Final   Special Requests   Final    BOTTLES DRAWN AEROBIC AND ANAEROBIC Blood Culture adequate volume   Culture   Final    NO GROWTH 3 DAYS Performed at [facility], [place] [address], [place], [place] [address]    Report Status PENDING  Incomplete  Blood Culture (routine x 2)     Status: None (Preliminary result)   Collection Time: 06/17/21 10:17 AM   Specimen: BLOOD  Result Value Ref Range Status   Specimen Description BLOOD SITE NOT SPECIFIED  Final   Special Requests   Final    BOTTLES DRAWN AEROBIC AND ANAEROBIC Blood Culture results may not be optimal due to an excessive volume of blood received in culture bottles   Culture   Final    NO GROWTH 3 DAYS Performed at [facility], [place] [address] [place], [place] [address]    Report Status PENDING  Incomplete  Resp Panel by RT-PCR (Flu A&B, Covid)     Status: None   Collection Time: 06/17/21  2:52 PM  Result Value Ref Range Status   SARS Coronavirus 2 by RT PCR NEGATIVE NEGATIVE Final    Comment: (NOTE) SARS-CoV-2 target nucleic acids are NOT DETECTED.  The SARS-CoV-2 RNA is generally detectable in upper respiratory specimens during the acute phase of infection. The lowest concentration of SARS-CoV-2 viral copies this assay can detect is 138 copies/mL. A negative result does not preclude SARS-Cov-2 infection and should not be used as the sole basis for treatment or other patient management decisions. A negative result may occur with  improper specimen collection/handling, submission of specimen other than nasopharyngeal swab, presence of viral mutation(s) within the areas targeted by this assay, and inadequate number of viral copies(<138 copies/mL). A negative result must be combined with clinical observations, patient history, and epidemiological information. The expected  result is Negative.  Fact Sheet for Patients:  EntrepreneurPulse.com.au  Fact Sheet for Healthcare Providers:  IncredibleEmployment.be  This test is no t yet approved or cleared by the [place] FDA and  has been authorized for detection and/or diagnosis of SARS-CoV-2 by FDA under an Emergency Use Authorization (EUA). This EUA will remain  in effect (meaning this test can be used) for the duration of the COVID-19 declaration under Section 564(b)(1) of the Act, 21 U.S.C.section 360bbb-3(b)(1), unless the authorization is terminated  or revoked sooner.       Influenza A by PCR NEGATIVE NEGATIVE Final   Influenza B by PCR NEGATIVE NEGATIVE Final    Comment: (NOTE) The Xpert Xpress SARS-CoV-2/FLU/RSV plus assay is intended as an aid in the diagnosis of influenza from Nasopharyngeal swab specimens and should not be used as a sole basis for treatment. Nasal washings and aspirates are unacceptable for Xpert Xpress SARS-CoV-2/FLU/RSV testing.  Fact Sheet for Patients: EntrepreneurPulse.com.au  Fact Sheet for Healthcare Providers: IncredibleEmployment.be  This test is not yet approved or cleared by the [place] FDA and has been authorized for detection and/or diagnosis of SARS-CoV-2 by FDA under an Emergency Use Authorization (EUA). This EUA will remain in effect (meaning this test can be used) for the duration of the COVID-19 declaration under Section 564(b)(1) of the Act, 21 U.S.C. section 360bbb-3(b)(1), unless the authorization is terminated or revoked.  Performed at [facility], [place] [address] [place], [place] [address]   Culture, Respiratory w Gram Stain     Status: None   Collection Time: 06/17/21 11:17 PM   Specimen: Tracheal Aspirate; Respiratory  Result Value Ref Range Status   Specimen Description TRACHEAL ASPIRATE  Final   Special Requests NONE  Final   Gram Stain   Final    NO  SQUAMOUS EPITHELIAL CELLS SEEN FEW WBC PRESENT,BOTH PMN AND MONONUCLEAR NO ORGANISMS SEEN    Culture   Final    RARE Normal respiratory flora-no Staph aureus or Pseudomonas seen Performed at [facility], [address] [place], [place] [address]    Report Status 06/20/2021 FINAL  Final  MRSA Next Gen by PCR, Nasal     Status: Abnormal   Collection Time: 06/18/21  1:16 AM   Specimen: Nasal Mucosa; Nasal Swab  Result Value Ref Range Status   MRSA by PCR Next Gen DETECTED (A) NOT DETECTED Final    Comment: RESULT CALLED TO, READ BACK BY AND VERIFIED WITH: GERMILLION,H RN 06/18/2021 AT 0218 SKEEN,P (NOTE) The GeneXpert MRSA Assay (FDA approved for NASAL specimens only), is one component of a comprehensive MRSA colonization surveillance program. It is not intended to diagnose MRSA infection nor to guide or monitor treatment for MRSA infections. Test performance is not FDA approved in patients less than 24 years old. Performed at [facility], [place] [address] [place] [address], [place], [place] [address]   Peritoneal fluid culture w Gram Stain     Status: None (Preliminary result)   Collection Time: 06/19/21  5:48 PM   Specimen: Peritoneal Fluid  Result Value Ref Range Status   Specimen Description FLUID PERITONEAL  Final   Special Requests NONE  Final   Gram Stain NO WBC SEEN NO ORGANISMS SEEN   Final   Culture   Final    NO GROWTH 2 DAYS Performed at [facility], [place] [address] [place], [place] [address]    Report Status PENDING  Incomplete      Radiology Studies:  DG CHEST PORT 1 VIEW  Result Date: 06/20/2021 CLINICAL DATA:  Wheezing EXAM: PORTABLE CHEST 1 VIEW COMPARISON:  Radiograph 02/16/2021 FINDINGS: Increasing bilateral perihilar opacities with cardiomegaly and indistinct pulmonary vascularity suggestive cardiogenic pulmonary edema. No visible layering pleural effusion. No pneumothorax. No acute abnormality of the chest wall. Telemetry leads overlie the chest.  IMPRESSION: Increasing bilateral perihilar opacity compared to most recent radiography. With cardiomegaly and indistinct pulmonary vascularity, suspect cardiogenic pulmonary edema though infection or ARDS could have this appearance in the appropriate clinical setting. Electronically Signed   By: Lovena Le M.D.   On: 06/20/2021 00:37    Scheduled Meds:  sodium chloride   Intravenous Once   sodium chloride   Intravenous Once   doxycycline  100 mg Oral P50D   folic acid  1 mg Oral Daily   lactulose  20 g Oral BID   mouth rinse  15 mL Mouth Rinse BID   multivitamin with minerals  1 tablet Oral Daily   mupirocin ointment  1 application Nasal BID   rifaximin  550 mg Oral BID   thiamine  100 mg Oral Daily   Or   thiamine  100 mg Intravenous Daily   Continuous Infusions:  sodium chloride 10 mL/hr at 06/19/21 1731     LOS: 4 days   Time spent:35 minutes.  Patrecia Pour, MD [facility] [email or web address] 06/21/2021, 9:58 AM

## 2021-06-22 LAB — COMPREHENSIVE METABOLIC PANEL
ALT: 38 U/L (ref 0–44)
AST: 92 U/L — ABNORMAL HIGH (ref 15–41)
Albumin: 1.4 g/dL — ABNORMAL LOW (ref 3.5–5.0)
Alkaline Phosphatase: 108 U/L (ref 38–126)
Anion gap: 6 (ref 5–15)
BUN: 12 mg/dL (ref 6–20)
CO2: 20 mmol/L — ABNORMAL LOW (ref 22–32)
Calcium: 7.2 mg/dL — ABNORMAL LOW (ref 8.9–10.3)
Chloride: 105 mmol/L (ref 98–111)
Creatinine, Ser: 0.66 mg/dL (ref 0.44–1.00)
GFR, Estimated: >60 mL/min (ref >60)
Glucose, Bld: 94 mg/dL (ref 70–99)
Potassium: 3.5 mmol/L (ref 3.5–5.1)
Sodium: 131 mmol/L — ABNORMAL LOW (ref 135–145)
Total Bilirubin: 7.9 mg/dL — ABNORMAL HIGH (ref 0.3–1.2)
Total Protein: 6.6 g/dL (ref 6.5–8.1)

## 2021-06-22 LAB — CULTURE, BLOOD (ROUTINE X 2)
Culture: NO GROWTH
Culture: NO GROWTH
Special Requests: ADEQUATE

## 2021-06-22 LAB — CBC
HCT: 22.2 % — ABNORMAL LOW (ref 36.0–46.0)
Hemoglobin: 7.4 g/dL — ABNORMAL LOW (ref 12.0–15.0)
MCH: 32.3 pg (ref 26.0–34.0)
MCHC: 33.3 g/dL (ref 30.0–36.0)
MCV: 96.9 fL (ref 80.0–100.0)
Platelets: 75 10*3/uL — ABNORMAL LOW (ref 150–400)
RBC: 2.29 MIL/uL — ABNORMAL LOW (ref 3.87–5.11)
RDW: 25.3 % — ABNORMAL HIGH (ref 11.5–15.5)
WBC: 16.5 10*3/uL — ABNORMAL HIGH (ref 4.0–10.5)
nRBC: 3.1 % — ABNORMAL HIGH (ref 0.0–0.2)

## 2021-06-22 LAB — MAGNESIUM: Magnesium: 1.4 mg/dL — ABNORMAL LOW (ref 1.7–2.4)

## 2021-06-22 MED ORDER — MAGNESIUM SULFATE 2 GM/50ML IV SOLN
2.0000 g | Freq: Once | INTRAVENOUS | Status: AC
  Administered 2021-06-22: 2 g via INTRAVENOUS
  Filled 2021-06-22: qty 50

## 2021-06-22 MED ORDER — GERHARDT'S BUTT CREAM
TOPICAL_CREAM | Freq: Three times a day (TID) | CUTANEOUS | Status: DC
  Administered 2021-06-27: 1 via TOPICAL
  Filled 2021-06-22: qty 1

## 2021-06-22 MED ORDER — FUROSEMIDE 10 MG/ML IJ SOLN
40.0000 mg | Freq: Two times a day (BID) | INTRAMUSCULAR | Status: DC
  Administered 2021-06-22 (×2): 40 mg via INTRAVENOUS
  Filled 2021-06-22 (×2): qty 4

## 2021-06-22 MED ORDER — POTASSIUM CHLORIDE CRYS ER 20 MEQ PO TBCR
40.0000 meq | EXTENDED_RELEASE_TABLET | Freq: Once | ORAL | Status: AC
  Administered 2021-06-22: 40 meq via ORAL
  Filled 2021-06-22: qty 2

## 2021-06-22 NOTE — Plan of Care (Signed)

## 2021-06-22 NOTE — Progress Notes (Signed)
Patient required changing of Flexiseal system to one that was compatible with drainage bags from the old system.  Thus, it was determined that the old system required removal after filtration bag was full.  New Flexiseal inserted and thorough perineal care was administered.  Patient was resistant at first, but consented to replacement after rational of replacement was explained to her.  Peri-rectal area very excoriated.  Importance of frequent and thorough perineal care was discussed.  Pt noted to be developing foot drop bilaterally.  Refusing mobility by staff.

## 2021-06-22 NOTE — Progress Notes (Signed)
PROGRESS NOTE  Michelle Little  PZW:[id number] DOB: August 11, 1986 DOA: 06/17/2021 PCP: Pcp, No   Brief Narrative: Michelle Little is a 35 y.o. female with a history of EtOH cirrhosis, tobacco use who presented from [facility] due to somnolence, unresponsiveness on 7/25 requiring intubation on arrival, subsequently extubated 7/26. Initial hemoglobin noted to be 1.8 for which 5u PRBCs were given and GI consulted, though FOBT was negative. Treatment with lactulose and rifaximin for hepatic encephalopathy complicated by diarrhea has improved mentation significantly. Paracentesis 7/27 showed no SBP, transudate consistent with portal HTN. Doxycycline has been given for pneumonia and lasix has been given for persistent volume overload.   Assessment & Plan: Active Problems:   Acute encephalopathy   Anemia   Hepatic encephalopathy (HCC)   Acute renal failure (HCC)   Other ascites   Cirrhosis (HCC)  Acute hepatic encephalopathy: Improving. CT head nonacute.  - Continue rifaximin. Will further deescalate lactulose dosing. Mentation continues to be improved and put out >1L liquid stool yesterday. Monitor output with hopes of [place] flexiseal tomorrow. - Delirium precautions to continue  Decompensated alcoholic cirrhosis with ascites, hypoalbuminemia, thrombocytopenia, coagulopathy: MELD-Na score is 27. Hepatitis panel negative.  - s/p paracentesis 7/27 by PCCM negative for SBP. Ascitic fluid albumin <1.  - Increase lasix to 90m IV BID, monitor weights, I/O. Still grossly overloaded. Will need to add spironolactone as well either per GI or after acute diuresis needs met.   Symptomatic macrocytic anemia: FOBT negative. Ferritin 52, iron 41, TIBC low 193. B12 1,496.  - Hgb stable after 5u PRBCs on 7/25. - No endoscopic evaluation currently planned.    Folic acid deficiency:  - Continue supplementation.   CAP:  - Continue 5 days abx w/doxycycline. No hypoxia.   Leukocytosis: Persistent,  unclear chronicity. Afebrile, so will not escalate antimicrobials at this time.   Lactic acidosis: Resolved, level was 10.   Hypokalemia - Replete today, recheck in AM  Hypomagnesemia:  - Supplement and continue monitoring.    AKI: Resolved with fluid + albumin (pre-renal FENa), now with evidence of overload so attempting diuresis as above.  - Creatinine continues to improve in the face now of diuresis. Continue daily monitoring.     Asymptomatic bacteriuria: Negative urine culture.   Alcohol abuse: Given ativan for possible withdrawal earlier in hospitalization which caused AMS. No evidence of withdrawal at this time.  - Avoid ativan, etc.  - Cessation counseling  No glycemic excursions, stop checking CBGs.   DVT prophylaxis: SCDs Code Status: Full Family Communication: Spouse at bedside Disposition Plan:  Status is: Inpatient  Remains inpatient appropriate because:Inpatient level of care appropriate due to severity of illness  Dispo: The patient is from: Home              Anticipated d/c is to: Home              Patient currently is not medically stable to d/c.   Difficult to place patient No  Consultants:  GI PCCM  Procedures:  Paracentesis 7/27  Antimicrobials: Doxycycline   Subjective: Pt did not stop speaking with her uncle on the phone during encounter. Voiced no complaints. Denies shortness of breath. Has not been getting OOB. No fever. Asked me to go get mustard.  Objective: Vitals:   06/22/21 0311 06/22/21 0724 06/22/21 1056 06/22/21 1100  BP: 112/67 103/70 121/83 121/83  Pulse: 100 88  99  Resp: 20 (!) 25 (!) 22 (!) 24  Temp: 98 F (36.7 C) 98.2 F (  36.8 C) 97.7 F (36.5 C)   TempSrc: Oral Oral Oral   SpO2: 100% 100% 95% 97%  Weight: 74.7 kg     Height:        Intake/Output Summary (Last 24 hours) at 06/22/2021 1401 Last data filed at 06/22/2021 1025 Gross per 24 hour  Intake --  Output 2300 ml  Net -2300 ml   Filed Weights   06/20/21  0410 06/21/21 0435 06/22/21 0311  Weight: 72.9 kg 78.1 kg 74.7 kg   Gen: 35 y.o. female in no distress Pulm: Nonlabored breathing room air. Clear. CV: Regular rate and rhythm. No murmur, rub, or gallop. No JVD, 2-3+ pitting diffuse and dependent edema. GI: Abdomen soft, non-tender, non-distended, with normoactive bowel sounds.  Ext: Warm, no deformities Skin: No new rashes, lesions or ulcers on visualized skin. Neuro: Alert and oriented. Not cooperative with full exam Psych: UTD.  Data Reviewed: I have personally reviewed following labs and imaging studies  CBC: Recent Labs  Lab 06/17/21 1015 06/17/21 1647 06/19/21 2225 06/20/21 0456 06/20/21 1610 06/21/21 0601 06/22/21 0024  WBC NO RESULT, LAB ERROR, SEE [id number]  18.8*   < > 14.5* 15.5* 16.3* 16.7* 16.5*  NEUTROABS Not Measured  14.4*  --   --   --   --   --   --   HGB NO RESULT, LAB ERROR, SEE [id number]  1.8*   < > 7.8* 7.4* 8.3* 9.0* 7.4*  HCT Not Measured  7.3*   < > 21.6* 21.4* 23.4* 26.1* 22.2*  MCV Not Measured  125.9*   < > 93.5 91.5 92.9 95.6 96.9  PLT Not Measured  137*   < > 76* 69* 71* 79* 75*   < > = values in this interval not displayed.   Basic Metabolic Panel: Recent Labs  Lab 06/18/21 0223 06/18/21 0926 06/19/21 0456 06/19/21 2225 06/20/21 0456 06/20/21 1610 06/21/21 0601 06/22/21 0024  NA 134*   < > 136  --  132* 133* 132* 131*  K 3.9   < > 3.5  --  2.7* 3.5 3.8 3.5  CL 105   < > 107  --  105 106 104 105  CO2 20*   < > 20*  --  21* 20* 21* 20*  GLUCOSE 131*   < > 110*  --  106* 117* 98 94  BUN 39*   < > 38*  --  28* 24* 18 12  CREATININE 2.80*   < > 2.02*  --  1.25* 0.96 0.83 0.66  CALCIUM 7.6*   < > 7.6*  --  7.5* 7.4* 7.7* 7.2*  MG 1.5*  --   --  2.2 2.0  --  1.7 1.4*  PHOS  --   --   --  2.6  --   --   --   --    < > = values in this interval not displayed.   GFR: Estimated Creatinine Clearance: 95.3 mL/min (by C-G formula based on SCr of 0.66 mg/dL). Liver Function Tests: Recent Labs   Lab 06/18/21 0223 06/19/21 0456 06/20/21 0456 06/21/21 0601 06/22/21 0024  AST 122*  121* 119* 115* 124* 92*  ALT 31  29 32 39 44 38  ALKPHOS 102  98 90 101 103 108  BILITOT 7.7*  7.5* 8.7* 8.2* 9.6* 7.9*  PROT 7.2  7.1 7.4 7.2 7.5 6.6  ALBUMIN 1.2*  1.2* 1.6* 1.7* 1.6* 1.4*   No results for input(s): LIPASE, AMYLASE in the last 168  hours. Recent Labs  Lab 06/17/21 1402 06/18/21 0223 06/21/21 0601  AMMONIA 82* 51* 31   Coagulation Profile: Recent Labs  Lab 06/17/21 1015 06/18/21 0223 06/18/21 0731  INR 2.3* 2.1* 2.1*   Cardiac Enzymes: No results for input(s): CKTOTAL, CKMB, CKMBINDEX, TROPONINI in the last 168 hours. BNP (last 3 results) No results for input(s): PROBNP in the last 8760 hours. HbA1C: No results for input(s): HGBA1C in the last 72 hours. CBG: Recent Labs  Lab 06/20/21 1642 06/20/21 1952 06/20/21 2350 06/21/21 0442 06/21/21 0721  GLUCAP 116* 106* 103* 90 96   Lipid Profile: No results for input(s): CHOL, HDL, LDLCALC, TRIG, CHOLHDL, LDLDIRECT in the last 72 hours. Thyroid Function Tests: No results for input(s): TSH, T4TOTAL, FREET4, T3FREE, THYROIDAB in the last 72 hours.  Anemia Panel: No results for input(s): VITAMINB12, FOLATE, FERRITIN, TIBC, IRON, RETICCTPCT in the last 72 hours. Urine analysis:    Component Value Date/Time   COLORURINE AMBER (A) 06/17/2021 1011   APPEARANCEUR HAZY (A) 06/17/2021 1011   LABSPEC 1.020 06/17/2021 1011   PHURINE 5.0 06/17/2021 1011   GLUCOSEU NEGATIVE 06/17/2021 1011   HGBUR NEGATIVE 06/17/2021 1011   BILIRUBINUR LARGE (A) 06/17/2021 1011   KETONESUR 15 (A) 06/17/2021 1011   PROTEINUR 30 (A) 06/17/2021 1011   NITRITE NEGATIVE 06/17/2021 1011   LEUKOCYTESUR MODERATE (A) 06/17/2021 1011   Recent Results (from the past 240 hour(s))  Urine Culture     Status: None   Collection Time: 06/17/21 10:11 AM   Specimen: In/Out Cath Urine  Result Value Ref Range Status   Specimen Description IN/OUT  CATH URINE  Final   Special Requests NONE  Final   Culture   Final    NO GROWTH Performed at [facility], [place] [address], [place], [place] [address]    Report Status 06/19/2021 FINAL  Final  Blood Culture (routine x 2)     Status: None (Preliminary result)   Collection Time: 06/17/21 10:15 AM   Specimen: BLOOD LEFT FOREARM  Result Value Ref Range Status   Specimen Description BLOOD LEFT FOREARM  Final   Special Requests   Final    BOTTLES DRAWN AEROBIC AND ANAEROBIC Blood Culture adequate volume   Culture   Final    NO GROWTH 3 DAYS Performed at [facility], [place] [address] [place], [place] [address]    Report Status PENDING  Incomplete  Blood Culture (routine x 2)     Status: None (Preliminary result)   Collection Time: 06/17/21 10:17 AM   Specimen: BLOOD  Result Value Ref Range Status   Specimen Description BLOOD SITE NOT SPECIFIED  Final   Special Requests   Final    BOTTLES DRAWN AEROBIC AND ANAEROBIC Blood Culture results may not be optimal due to an excessive volume of blood received in culture bottles   Culture   Final    NO GROWTH 3 DAYS Performed at [facility], [place] [address] [place], [place] [address]    Report Status PENDING  Incomplete  Resp Panel by RT-PCR (Flu A&B, Covid)     Status: None   Collection Time: 06/17/21  2:52 PM  Result Value Ref Range Status   SARS Coronavirus 2 by RT PCR NEGATIVE NEGATIVE Final    Comment: (NOTE) SARS-CoV-2 target nucleic acids are NOT DETECTED.  The SARS-CoV-2 RNA is generally detectable in upper respiratory specimens during the acute phase of infection. The lowest concentration of SARS-CoV-2 viral copies this assay  can detect is 138 copies/mL. A negative result does not preclude SARS-Cov-2 infection and should not be used as the sole basis for treatment or other patient management decisions. A negative result may occur with  improper specimen collection/handling, submission of specimen other than  nasopharyngeal swab, presence of viral mutation(s) within the areas targeted by this assay, and inadequate number of viral copies(<138 copies/mL). A negative result must be combined with clinical observations, patient history, and epidemiological information. The expected result is Negative.  Fact Sheet for Patients:  EntrepreneurPulse.com.au  Fact Sheet for Healthcare Providers:  IncredibleEmployment.be  This test is no t yet approved or cleared by the [place] FDA and  has been authorized for detection and/or diagnosis of SARS-CoV-2 by FDA under an Emergency Use Authorization (EUA). This EUA will remain  in effect (meaning this test can be used) for the duration of the COVID-19 declaration under Section 564(b)(1) of the Act, 21 U.S.C.section 360bbb-3(b)(1), unless the authorization is terminated  or revoked sooner.       Influenza A by PCR NEGATIVE NEGATIVE Final   Influenza B by PCR NEGATIVE NEGATIVE Final    Comment: (NOTE) The Xpert Xpress SARS-CoV-2/FLU/RSV plus assay is intended as an aid in the diagnosis of influenza from Nasopharyngeal swab specimens and should not be used as a sole basis for treatment. Nasal washings and aspirates are unacceptable for Xpert Xpress SARS-CoV-2/FLU/RSV testing.  Fact Sheet for Patients: EntrepreneurPulse.com.au  Fact Sheet for Healthcare Providers: IncredibleEmployment.be  This test is not yet approved or cleared by the [place] FDA and has been authorized for detection and/or diagnosis of SARS-CoV-2 by FDA under an Emergency Use Authorization (EUA). This EUA will remain in effect (meaning this test can be used) for the duration of the COVID-19 declaration under Section 564(b)(1) of the Act, 21 U.S.C. section 360bbb-3(b)(1), unless the authorization is terminated or revoked.  Performed at [facility], [place] [address] [place], [place] [address]    Culture, Respiratory w Gram Stain     Status: None   Collection Time: 06/17/21 11:17 PM   Specimen: Tracheal Aspirate; Respiratory  Result Value Ref Range Status   Specimen Description TRACHEAL ASPIRATE  Final   Special Requests NONE  Final   Gram Stain   Final    NO SQUAMOUS EPITHELIAL CELLS SEEN FEW WBC PRESENT,BOTH PMN AND MONONUCLEAR NO ORGANISMS SEEN    Culture   Final    RARE Normal respiratory flora-no Staph aureus or Pseudomonas seen Performed at [facility], [address] [place] [address] [place], [place] [address]    Report Status 06/20/2021 FINAL  Final  MRSA Next Gen by PCR, Nasal     Status: Abnormal   Collection Time: 06/18/21  1:16 AM   Specimen: Nasal Mucosa; Nasal Swab  Result Value Ref Range Status   MRSA by PCR Next Gen DETECTED (A) NOT DETECTED Final    Comment: RESULT CALLED TO, READ BACK BY AND VERIFIED WITH: GERMILLION,H RN 06/18/2021 AT 0218 SKEEN,P (NOTE) The GeneXpert MRSA Assay (FDA approved for NASAL specimens only), is one component of a comprehensive MRSA colonization surveillance program. It is not intended to diagnose MRSA infection nor to guide or monitor treatment for MRSA infections. Test performance is not FDA approved in patients less than 69 years old. Performed at [facility], [place] [address] [place], [place] [address]   Peritoneal fluid culture w Gram Stain     Status: None (Preliminary result)   Collection Time: 06/19/21  5:48  PM   Specimen: Peritoneal Fluid  Result Value Ref Range Status   Specimen Description FLUID PERITONEAL  Final   Special Requests NONE  Final   Gram Stain NO WBC SEEN NO ORGANISMS SEEN   Final   Culture   Final    NO GROWTH 3 DAYS Performed at [facility], [address], [place], [place] [address]    Report Status PENDING  Incomplete      Radiology Studies: No results found.  Scheduled Meds:  folic acid  1 mg Oral Daily   furosemide  40 mg Intravenous BID   Gerhardt's butt cream   Topical TID    lactulose  10 g Oral BID   mouth rinse  15 mL Mouth Rinse BID   multivitamin with minerals  1 tablet Oral Daily   rifaximin  550 mg Oral BID   thiamine  100 mg Oral Daily   Or   thiamine  100 mg Intravenous Daily   Continuous Infusions:  sodium chloride 10 mL/hr at 06/19/21 1731   magnesium sulfate bolus IVPB       LOS: 5 days   Time spent: 35 minutes.  Patrecia Pour, MD [facility] [email or web address] 06/22/2021, 2:01 PM

## 2021-06-23 LAB — COMPREHENSIVE METABOLIC PANEL
ALT: 37 U/L (ref 0–44)
AST: 86 U/L — ABNORMAL HIGH (ref 15–41)
Albumin: 1.4 g/dL — ABNORMAL LOW (ref 3.5–5.0)
Alkaline Phosphatase: 124 U/L (ref 38–126)
Anion gap: 6 (ref 5–15)
BUN: 10 mg/dL (ref 6–20)
CO2: 21 mmol/L — ABNORMAL LOW (ref 22–32)
Calcium: 7.2 mg/dL — ABNORMAL LOW (ref 8.9–10.3)
Chloride: 102 mmol/L (ref 98–111)
Creatinine, Ser: 0.61 mg/dL (ref 0.44–1.00)
GFR, Estimated: >60 mL/min (ref >60)
Glucose, Bld: 103 mg/dL — ABNORMAL HIGH (ref 70–99)
Potassium: 3.3 mmol/L — ABNORMAL LOW (ref 3.5–5.1)
Sodium: 129 mmol/L — ABNORMAL LOW (ref 135–145)
Total Bilirubin: 8.1 mg/dL — ABNORMAL HIGH (ref 0.3–1.2)
Total Protein: 6.8 g/dL (ref 6.5–8.1)

## 2021-06-23 LAB — HEMOGLOBIN AND HEMATOCRIT, BLOOD
HCT: 25.3 % — ABNORMAL LOW (ref 36.0–46.0)
Hemoglobin: 8.3 g/dL — ABNORMAL LOW (ref 12.0–15.0)

## 2021-06-23 LAB — PREPARE RBC (CROSSMATCH)

## 2021-06-23 LAB — CBC
HCT: 22.5 % — ABNORMAL LOW (ref 36.0–46.0)
Hemoglobin: 7.3 g/dL — ABNORMAL LOW (ref 12.0–15.0)
MCH: 31.7 pg (ref 26.0–34.0)
MCHC: 32.4 g/dL (ref 30.0–36.0)
MCV: 97.8 fL (ref 80.0–100.0)
Platelets: 81 10*3/uL — ABNORMAL LOW (ref 150–400)
RBC: 2.3 MIL/uL — ABNORMAL LOW (ref 3.87–5.11)
RDW: 26.8 % — ABNORMAL HIGH (ref 11.5–15.5)
WBC: 17.1 10*3/uL — ABNORMAL HIGH (ref 4.0–10.5)
nRBC: 1.1 % — ABNORMAL HIGH (ref 0.0–0.2)

## 2021-06-23 LAB — BODY FLUID CULTURE W GRAM STAIN
Culture: NO GROWTH
Gram Stain: NONE SEEN

## 2021-06-23 LAB — MAGNESIUM: Magnesium: 1.3 mg/dL — ABNORMAL LOW (ref 1.7–2.4)

## 2021-06-23 MED ORDER — POTASSIUM CHLORIDE CRYS ER 20 MEQ PO TBCR
40.0000 meq | EXTENDED_RELEASE_TABLET | Freq: Two times a day (BID) | ORAL | Status: DC
  Administered 2021-06-23 – 2021-06-24 (×4): 40 meq via ORAL
  Filled 2021-06-23 (×4): qty 2

## 2021-06-23 MED ORDER — MAGNESIUM SULFATE 4 GM/100ML IV SOLN
4.0000 g | Freq: Once | INTRAVENOUS | Status: AC
  Administered 2021-06-23: 4 g via INTRAVENOUS
  Filled 2021-06-23: qty 100

## 2021-06-23 MED ORDER — SODIUM CHLORIDE 0.9% IV SOLUTION: Freq: Once | INTRAVENOUS | Status: DC

## 2021-06-23 MED ORDER — FUROSEMIDE 10 MG/ML IJ SOLN
80.0000 mg | Freq: Two times a day (BID) | INTRAMUSCULAR | Status: DC
  Administered 2021-06-23 – 2021-06-25 (×6): 80 mg via INTRAVENOUS
  Filled 2021-06-23 (×6): qty 8

## 2021-06-23 MED ORDER — HYDROCORTISONE (PERIANAL) 2.5 % EX CREA
TOPICAL_CREAM | Freq: Once | CUTANEOUS | Status: DC
  Filled 2021-06-23: qty 28.35

## 2021-06-23 NOTE — Social Work (Signed)
  CSW met with pt to address the SA consult. Pt explained that she had sought treatment before in [place]. CSW discussed a SA list and encouraged pt to follow up with the agencies. Pt stated that she has no choice to stop drinking to health reasons.

## 2021-06-23 NOTE — Progress Notes (Signed)
PROGRESS NOTE  Michelle Little  QBH:[id number] DOB: [birth year] DOA: 06/17/2021 PCP: Pcp, No   Brief Narrative: Michelle Little is a 35 y.o. female with a history of EtOH cirrhosis, tobacco use who presented from [facility] due to somnolence, unresponsiveness on 7/25 requiring intubation on arrival, subsequently extubated 7/26. Initial hemoglobin noted to be 1.8 for which 5u PRBCs were given and GI consulted, though FOBT was negative. Treatment with lactulose and rifaximin for hepatic encephalopathy complicated by diarrhea has improved mentation significantly. Paracentesis 7/27 showed no SBP, transudate consistent with portal HTN. Doxycycline has been given for pneumonia and lasix has been given for persistent volume overload.   Assessment & Plan: Active Problems:   Acute encephalopathy   Anemia   Hepatic encephalopathy (HCC)   Acute renal failure (HCC)   Other ascites   Cirrhosis (HCC)  Acute hepatic encephalopathy: Improving. CT head nonacute.  - Continue rifaximin.  - Continue lactulose at current dosing given clearing of confusion and improvement in stool quantity (still at goal of 3-5 BM/day.  - Delirium precautions to continue  Decompensated alcoholic cirrhosis with ascites, hypoalbuminemia, thrombocytopenia, coagulopathy: MELD-Na score is 27. Hepatitis panel negative.  - s/p paracentesis 7/27 by PCCM negative for SBP. Ascitic fluid albumin <1.  - Increase lasix to 61m IV BID with inadequate UOP over past 24 hours and stable gross volume overload.  - Monitor weights, I/O.  - Will need to add spironolactone as well after acute diuresis needs met.   Symptomatic macrocytic anemia: FOBT negative. Ferritin 52, iron 41, TIBC low 193. B12 1,496.  - Hgb remains <8g/dl without acute drop after 5u PRBCs on 7/25. With continued third spacing and dyspnea on exertion, will transfuse 1u PRBCs for symptomatic anemia. - No endoscopic evaluation currently planned.    Folic acid  deficiency:  - Continue supplementation.   CAP: Completed 5 days abx  Leukocytosis: Persistent, unclear chronicity.  - Pt remains without focal infectious nidus and afebrile, so will not escalate antimicrobials at this time.   Lactic acidosis: Resolved, level was 10. Suspect impaired hepatic clearance in addition to infection as etiology.   Hypokalemia - Replete BID today and monitor  Hypomagnesemia:  - Supplement with 4g IV today and continue monitoring   AKI: Resolved with fluid + albumin (pre-renal FENa), now with evidence of overload so attempting diuresis as above.  - Creatinine stable, will push diuresis as above   Asymptomatic bacteriuria: Negative urine culture.   Alcohol abuse: Given ativan for possible withdrawal earlier in hospitalization which caused AMS. No evidence of withdrawal at this time.  - Avoid ativan, etc.  - Cessation counseling  No glycemic excursions, stop checking CBGs.   DVT prophylaxis: SCDs Code Status: Full Family Communication: None at bedside Disposition Plan:  Status is: Inpatient  Remains inpatient appropriate because:Inpatient level of care appropriate due to severity of illness  Dispo: The patient is from: Home              Anticipated d/c is to: Home              Patient currently is not medically stable to d/c.   Difficult to place patient No  Consultants:  GI PCCM  Procedures:  Paracentesis 7/27  Antimicrobials: Doxycycline 7/27 - 7/29 Vancomycin, cefepime, flagyl 7/25 Ceftriaxone, azithromycin 7/25 - 7/26 Rifaximin 7/28 >>  Subjective: More interactive with me this morning, stating she's eating but doesn't like the food. Denies any bleeding. Rectal tube out yesterday, planning on getting OOB more now.  No chest pain or dyspnea or vomiting.   Objective: Vitals:   06/22/21 1517 06/22/21 1949 06/23/21 0515 06/23/21 0741  BP: 113/77 106/69  113/73  Pulse: 98   99  Resp: (!) 24 (!) 23  20  Temp: 98 F (36.7 C) 98.2 F  (36.8 C)  97.8 F (36.6 C)  TempSrc: Oral Oral  Oral  SpO2: 94%   95%  Weight:   77.1 kg   Height:        Intake/Output Summary (Last 24 hours) at 06/23/2021 0908 Last data filed at 06/22/2021 1420 Gross per 24 hour  Intake 300 ml  Output 1250 ml  Net -950 ml   Filed Weights   06/21/21 0435 06/22/21 0311 06/23/21 0515  Weight: 78.1 kg 74.7 kg 77.1 kg   Gen: 35 y.o. female in no distress Pulm: Nonlabored breathing room air. Clear, diminished at bases. CV: Regular rate and rhythm. No murmur, rub, or gallop. No JVD, 2+ essentially unchanged pitting edema. GI: Abdomen soft, somewhat distended but nontender w/+BS.  Ext: Warm, no deformities Skin: No rashes, lesions or ulcers on visualized skin. Neuro: Alert and oriented. No asterixis or focal neurological deficits. Psych: Judgement and insight appear marginal. Behavior is appropriate.    Data Reviewed: I have personally reviewed following labs and imaging studies  CBC: Recent Labs  Lab 06/17/21 1015 06/17/21 1647 06/20/21 0456 06/20/21 1610 06/21/21 0601 06/22/21 0024 06/23/21 0040  WBC NO RESULT, LAB ERROR, SEE [id number]  18.8*   < > 15.5* 16.3* 16.7* 16.5* 17.1*  NEUTROABS Not Measured  14.4*  --   --   --   --   --   --   HGB NO RESULT, LAB ERROR, SEE [id number]  1.8*   < > 7.4* 8.3* 9.0* 7.4* 7.3*  HCT Not Measured  7.3*   < > 21.4* 23.4* 26.1* 22.2* 22.5*  MCV Not Measured  125.9*   < > 91.5 92.9 95.6 96.9 97.8  PLT Not Measured  137*   < > 69* 71* 79* 75* 81*   < > = values in this interval not displayed.   Basic Metabolic Panel: Recent Labs  Lab 06/19/21 2225 06/20/21 0456 06/20/21 1610 06/21/21 0601 06/22/21 0024 06/23/21 0040  NA  --  132* 133* 132* 131* 129*  K  --  2.7* 3.5 3.8 3.5 3.3*  CL  --  105 106 104 105 102  CO2  --  21* 20* 21* 20* 21*  GLUCOSE  --  106* 117* 98 94 103*  BUN  --  28* 24* 18 12 10   CREATININE  --  1.25* 0.96 0.83 0.66 0.61  CALCIUM  --  7.5* 7.4* 7.7* 7.2* 7.2*  MG 2.2 2.0   --  1.7 1.4* 1.3*  PHOS 2.6  --   --   --   --   --    GFR: Estimated Creatinine Clearance: 96.8 mL/min (by C-G formula based on SCr of 0.61 mg/dL). Liver Function Tests: Recent Labs  Lab 06/19/21 0456 06/20/21 0456 06/21/21 0601 06/22/21 0024 06/23/21 0040  AST 119* 115* 124* 92* 86*  ALT 32 39 44 38 37  ALKPHOS 90 101 103 108 124  BILITOT 8.7* 8.2* 9.6* 7.9* 8.1*  PROT 7.4 7.2 7.5 6.6 6.8  ALBUMIN 1.6* 1.7* 1.6* 1.4* 1.4*   No results for input(s): LIPASE, AMYLASE in the last 168 hours. Recent Labs  Lab 06/17/21 1402 06/18/21 0223 06/21/21 0601  AMMONIA 82* 51* 31   Coagulation  Profile: Recent Labs  Lab 06/17/21 1015 06/18/21 0223 06/18/21 0731  INR 2.3* 2.1* 2.1*   Cardiac Enzymes: No results for input(s): CKTOTAL, CKMB, CKMBINDEX, TROPONINI in the last 168 hours. BNP (last 3 results) No results for input(s): PROBNP in the last 8760 hours. HbA1C: No results for input(s): HGBA1C in the last 72 hours. CBG: Recent Labs  Lab 06/20/21 1642 06/20/21 1952 06/20/21 2350 06/21/21 0442 06/21/21 0721  GLUCAP 116* 106* 103* 90 96   Lipid Profile: No results for input(s): CHOL, HDL, LDLCALC, TRIG, CHOLHDL, LDLDIRECT in the last 72 hours. Thyroid Function Tests: No results for input(s): TSH, T4TOTAL, FREET4, T3FREE, THYROIDAB in the last 72 hours.  Anemia Panel: No results for input(s): VITAMINB12, FOLATE, FERRITIN, TIBC, IRON, RETICCTPCT in the last 72 hours. Urine analysis:    Component Value Date/Time   COLORURINE AMBER (A) 06/17/2021 1011   APPEARANCEUR HAZY (A) 06/17/2021 1011   LABSPEC 1.020 06/17/2021 1011   PHURINE 5.0 06/17/2021 1011   GLUCOSEU NEGATIVE 06/17/2021 1011   HGBUR NEGATIVE 06/17/2021 1011   BILIRUBINUR LARGE (A) 06/17/2021 1011   KETONESUR 15 (A) 06/17/2021 1011   PROTEINUR 30 (A) 06/17/2021 1011   NITRITE NEGATIVE 06/17/2021 1011   LEUKOCYTESUR MODERATE (A) 06/17/2021 1011   Recent Results (from the past 240 hour(s))  Urine  Culture     Status: None   Collection Time: 06/17/21 10:11 AM   Specimen: In/Out Cath Urine  Result Value Ref Range Status   Specimen Description IN/OUT CATH URINE  Final   Special Requests NONE  Final   Culture   Final    NO GROWTH Performed at [facility], [place] [address], [place], [place] [address]    Report Status 06/19/2021 FINAL  Final  Blood Culture (routine x 2)     Status: None   Collection Time: 06/17/21 10:15 AM   Specimen: BLOOD LEFT FOREARM  Result Value Ref Range Status   Specimen Description BLOOD LEFT FOREARM  Final   Special Requests   Final    BOTTLES DRAWN AEROBIC AND ANAEROBIC Blood Culture adequate volume   Culture   Final    NO GROWTH 5 DAYS Performed at [facility], [place] [address], [place], [place] [address]    Report Status 06/22/2021 FINAL  Final  Blood Culture (routine x 2)     Status: None   Collection Time: 06/17/21 10:17 AM   Specimen: BLOOD  Result Value Ref Range Status   Specimen Description BLOOD SITE NOT SPECIFIED  Final   Special Requests   Final    BOTTLES DRAWN AEROBIC AND ANAEROBIC Blood Culture results may not be optimal due to an excessive volume of blood received in culture bottles   Culture   Final    NO GROWTH 5 DAYS Performed at [facility], [place] [address] [place], [place] [address]    Report Status 06/22/2021 FINAL  Final  Resp Panel by RT-PCR (Flu A&B, Covid)     Status: None   Collection Time: 06/17/21  2:52 PM  Result Value Ref Range Status   SARS Coronavirus 2 by RT PCR NEGATIVE NEGATIVE Final    Comment: (NOTE) SARS-CoV-2 target nucleic acids are NOT DETECTED.  The SARS-CoV-2 RNA is generally detectable in upper respiratory specimens during the acute phase of infection. The lowest concentration of SARS-CoV-2 viral copies this assay can detect is 138 copies/mL. A negative result does not preclude SARS-Cov-2 infection and should not be used as the sole  basis for treatment or other patient management  decisions. A negative result may occur with  improper specimen collection/handling, submission of specimen other than nasopharyngeal swab, presence of viral mutation(s) within the areas targeted by this assay, and inadequate number of viral copies(<138 copies/mL). A negative result must be combined with clinical observations, patient history, and epidemiological information. The expected result is Negative.  Fact Sheet for Patients:  EntrepreneurPulse.com.au  Fact Sheet for Healthcare Providers:  IncredibleEmployment.be  This test is no t yet approved or cleared by the [place] FDA and  has been authorized for detection and/or diagnosis of SARS-CoV-2 by FDA under an Emergency Use Authorization (EUA). This EUA will remain  in effect (meaning this test can be used) for the duration of the COVID-19 declaration under Section 564(b)(1) of the Act, 21 U.S.C.section 360bbb-3(b)(1), unless the authorization is terminated  or revoked sooner.       Influenza A by PCR NEGATIVE NEGATIVE Final   Influenza B by PCR NEGATIVE NEGATIVE Final    Comment: (NOTE) The Xpert Xpress SARS-CoV-2/FLU/RSV plus assay is intended as an aid in the diagnosis of influenza from Nasopharyngeal swab specimens and should not be used as a sole basis for treatment. Nasal washings and aspirates are unacceptable for Xpert Xpress SARS-CoV-2/FLU/RSV testing.  Fact Sheet for Patients: EntrepreneurPulse.com.au  Fact Sheet for Healthcare Providers: IncredibleEmployment.be  This test is not yet approved or cleared by the [place] FDA and has been authorized for detection and/or diagnosis of SARS-CoV-2 by FDA under an Emergency Use Authorization (EUA). This EUA will remain in effect (meaning this test can be used) for the duration of the COVID-19 declaration under Section 564(b)(1) of the Act, 21 U.S.C. section 360bbb-3(b)(1), unless the  authorization is terminated or revoked.  Performed at [facility], [place] [address] [place], [place] [address]   Culture, Respiratory w Gram Stain     Status: None   Collection Time: 06/17/21 11:17 PM   Specimen: Tracheal Aspirate; Respiratory  Result Value Ref Range Status   Specimen Description TRACHEAL ASPIRATE  Final   Special Requests NONE  Final   Gram Stain   Final    NO SQUAMOUS EPITHELIAL CELLS SEEN FEW WBC PRESENT,BOTH PMN AND MONONUCLEAR NO ORGANISMS SEEN    Culture   Final    RARE Normal respiratory flora-no Staph aureus or Pseudomonas seen Performed at [facility], [address], [place], [place] [address]    Report Status 06/20/2021 FINAL  Final  MRSA Next Gen by PCR, Nasal     Status: Abnormal   Collection Time: 06/18/21  1:16 AM   Specimen: Nasal Mucosa; Nasal Swab  Result Value Ref Range Status   MRSA by PCR Next Gen DETECTED (A) NOT DETECTED Final    Comment: RESULT CALLED TO, READ BACK BY AND VERIFIED WITH: GERMILLION,H RN 06/18/2021 AT 0218 SKEEN,P (NOTE) The GeneXpert MRSA Assay (FDA approved for NASAL specimens only), is one component of a comprehensive MRSA colonization surveillance program. It is not intended to diagnose MRSA infection nor to guide or monitor treatment for MRSA infections. Test performance is not FDA approved in patients less than 28 years old. Performed at [facility], [place] [address] [place],  [address]   Peritoneal fluid culture w Gram Stain     Status: None   Collection Time: 06/19/21  5:48 PM   Specimen: Peritoneal Fluid  Result Value Ref Range Status   Specimen Description FLUID PERITONEAL  Final   Special  Requests NONE  Final   Gram Stain NO WBC SEEN NO ORGANISMS SEEN   Final   Culture   Final    NO GROWTH 3 DAYS Performed at [facility], [place] [address], [place], [place] [address]    Report Status 06/23/2021 FINAL  Final      Radiology Studies: No results found.  Scheduled  Meds:  sodium chloride   Intravenous Once   folic acid  1 mg Oral Daily   furosemide  80 mg Intravenous BID   Gerhardt's butt cream   Topical TID   hydrocortisone   Rectal Once   lactulose  10 g Oral BID   mouth rinse  15 mL Mouth Rinse BID   multivitamin with minerals  1 tablet Oral Daily   potassium chloride  40 mEq Oral BID   rifaximin  550 mg Oral BID   thiamine  100 mg Oral Daily   Or   thiamine  100 mg Intravenous Daily   Continuous Infusions:  sodium chloride 10 mL/hr at 06/19/21 1731   magnesium sulfate bolus IVPB       LOS: 6 days   Time spent: 35 minutes.  Patrecia Pour, MD [facility] [email or web address] 06/23/2021, 9:08 AM

## 2021-06-23 NOTE — Plan of Care (Signed)

## 2021-06-24 LAB — BASIC METABOLIC PANEL
Anion gap: 9 (ref 5–15)
BUN: 14 mg/dL (ref 6–20)
CO2: 20 mmol/L — ABNORMAL LOW (ref 22–32)
Calcium: 7.3 mg/dL — ABNORMAL LOW (ref 8.9–10.3)
Chloride: 102 mmol/L (ref 98–111)
Creatinine, Ser: 0.75 mg/dL (ref 0.44–1.00)
GFR, Estimated: >60 mL/min (ref >60)
Glucose, Bld: 109 mg/dL — ABNORMAL HIGH (ref 70–99)
Potassium: 3.4 mmol/L — ABNORMAL LOW (ref 3.5–5.1)
Sodium: 131 mmol/L — ABNORMAL LOW (ref 135–145)

## 2021-06-24 LAB — BPAM RBC
Blood Product Expiration Date: 202208152359
ISSUE DATE / TIME: 202207311522
Unit Type and Rh: 7300

## 2021-06-24 LAB — CBC
HCT: 25.6 % — ABNORMAL LOW (ref 36.0–46.0)
Hemoglobin: 8.5 g/dL — ABNORMAL LOW (ref 12.0–15.0)
MCH: 31.7 pg (ref 26.0–34.0)
MCHC: 33.2 g/dL (ref 30.0–36.0)
MCV: 95.5 fL (ref 80.0–100.0)
Platelets: 78 10*3/uL — ABNORMAL LOW (ref 150–400)
RBC: 2.68 MIL/uL — ABNORMAL LOW (ref 3.87–5.11)
RDW: 25.6 % — ABNORMAL HIGH (ref 11.5–15.5)
WBC: 16.3 10*3/uL — ABNORMAL HIGH (ref 4.0–10.5)
nRBC: 0.3 % — ABNORMAL HIGH (ref 0.0–0.2)

## 2021-06-24 LAB — TYPE AND SCREEN
ABO/RH(D): B POS
Antibody Screen: NEGATIVE
Unit division: 0

## 2021-06-24 LAB — MAGNESIUM: Magnesium: 1.9 mg/dL (ref 1.7–2.4)

## 2021-06-24 MED ORDER — SPIRONOLACTONE 25 MG PO TABS
100.0000 mg | ORAL_TABLET | Freq: Every day | ORAL | Status: DC
  Administered 2021-06-24 – 2021-06-30 (×7): 100 mg via ORAL
  Filled 2021-06-24 (×8): qty 4

## 2021-06-24 MED ORDER — ONDANSETRON HCL 4 MG/2ML IJ SOLN
4.0000 mg | Freq: Four times a day (QID) | INTRAMUSCULAR | Status: DC | PRN
Start: 2021-06-24 — End: 2021-06-30
  Administered 2021-06-24: 4 mg via INTRAVENOUS
  Filled 2021-06-24: qty 2

## 2021-06-24 NOTE — Plan of Care (Signed)

## 2021-06-24 NOTE — Progress Notes (Signed)
PROGRESS NOTE  Janelli Welling  YQI:[id number] DOB: July 29, 1986 DOA: 06/17/2021 PCP: Pcp, No   Brief Narrative: Michelle Little is a 35 y.o. female with a history of EtOH cirrhosis, tobacco use who presented from [facility] due to somnolence, unresponsiveness on 7/25 requiring intubation on arrival, subsequently extubated 7/26. Initial hemoglobin noted to be 1.8 for which 5u PRBCs were given and GI consulted, though FOBT was negative. Treatment with lactulose and rifaximin for hepatic encephalopathy complicated by diarrhea has improved mentation significantly. Paracentesis 7/27 showed no SBP, transudate consistent with portal HTN. Doxycycline has been given for pneumonia and lasix has been given for persistent volume overload.   Assessment & Plan: Active Problems:   Acute encephalopathy   Anemia   Hepatic encephalopathy (HCC)   Acute renal failure (HCC)   Other ascites   Cirrhosis (HCC)  Acute hepatic encephalopathy: Improving. CT head nonacute.  - Continue rifaximin.  - Continue lactulose at current lower dosing given clearing of confusion and improvement in stool quantity (still at goal of 3-5 BM/day.  - Delirium precautions to continue  Decompensated alcoholic cirrhosis with ascites, hypoalbuminemia, thrombocytopenia, coagulopathy: MELD-Na score is 27. Hepatitis panel negative.  - s/p paracentesis 7/27 by PCCM negative for SBP. Ascitic fluid albumin <1.  - Increase lasix to  IV BID with inadequate UOP over past 24 hours and stable gross volume overload.  - Monitor weights, I/O.  - Will add spironolactone  today.  Symptomatic macrocytic anemia: FOBT negative. Ferritin 52, iron 41, TIBC low 193. B12 1,496.  - Hgb improved with additional 1u PRBCs 7/31. Also s/p 5u at admission. remains <8g/dl without acute drop after 5u PRBCs on 7/25.  - No endoscopic evaluation currently planned.    Folic acid deficiency:  - Continue supplementation.   CAP: Completed 5 days  abx  Leukocytosis: Persistent, unclear chronicity.  - Pt remains without focal infectious nidus and afebrile, so will not escalate antimicrobials at this time.   Lactic acidosis: Resolved, level was 10. Suspect impaired hepatic clearance in addition to infection as etiology.   Hypokalemia - Replete BID, adding spiro as above  Hypomagnesemia:  - Supplement with 4g IV today and continue monitoring   AKI: Resolved with fluid + albumin (pre-renal FENa), now with evidence of overload so attempting diuresis as above.  - Creatinine stable, BUN widening a bit, will continue IV diuresis as above and regular metabolic panel monitoring.   Asymptomatic bacteriuria: Negative urine culture.   Alcohol abuse: Given ativan for possible withdrawal earlier in hospitalization which caused AMS. No evidence of withdrawal at this time.  - Avoid ativan, etc.  - Cessation counseling  No glycemic excursions, stop checking CBGs.   DVT prophylaxis: SCDs Code Status: Full Family Communication: None at bedside Disposition Plan:  Status is: Inpatient  Remains inpatient appropriate because:Inpatient level of care appropriate due to severity of illness  Dispo: The patient is from: Home              Anticipated d/c is to: Home              Patient currently is not medically stable to d/c.   Difficult to place patient No  Consultants:  GI PCCM  Procedures:  Paracentesis 7/27  Antimicrobials: Doxycycline 7/27 - 7/29 Vancomycin, cefepime, flagyl 7/25 Ceftriaxone, azithromycin 7/25 - 7/26 Rifaximin 7/28 >>  Subjective: Swelling in legs is stable, made a bit more urine yesterday, 4-5 stools yesterday are starting to get somewhat formed, not entirely liquid but still so  fast she was incontinent in bed. No dyspnea or chest pain.   Objective: Vitals:   06/24/21 0542 06/24/21 0548 06/24/21 0600 06/24/21 0720  BP:    111/71  Pulse:      Resp: (!) 21 20 17 16   Temp:    97.9 F (36.6 C)  TempSrc:     Oral  SpO2:    90%  Weight: 72 kg     Height:        Intake/Output Summary (Last 24 hours) at 06/24/2021 0900 Last data filed at 06/23/2021 1733 Gross per 24 hour  Intake 774 ml  Output 600 ml  Net 174 ml   Filed Weights   06/22/21 0311 06/23/21 0515 06/24/21 0542  Weight: 74.7 kg 77.1 kg 72 kg   Gen: 35 y.o. female in no distress Pulm: Nonlabored breathing room air. Clear, diminished at bases. CV: Regular rate and rhythm. No murmur, rub, or gallop. No JVD, 2+ dependent edema. GI: Abdomen soft, more distended, nontender with normoactive bowel sounds.  Ext: Warm, no deformities Skin: No new rashes, lesions or ulcers on visualized skin. +jaundice. Neuro: Alert and oriented. No focal neurological deficits. Psych: Judgement and insight appear fair. Mood euthymic & affect congruent. Behavior is appropriate.    Data Reviewed: I have personally reviewed following labs and imaging studies  CBC: Recent Labs  Lab 06/17/21 1015 06/17/21 1647 06/20/21 1610 06/21/21 0601 06/22/21 0024 06/23/21 0040 06/23/21 1915 06/24/21 0013  WBC NO RESULT, LAB ERROR, SEE [id number]  18.8*   < > 16.3* 16.7* 16.5* 17.1*  --  16.3*  NEUTROABS Not Measured  14.4*  --   --   --   --   --   --   --   HGB NO RESULT, LAB ERROR, SEE [id number]  1.8*   < > 8.3* 9.0* 7.4* 7.3* 8.3* 8.5*  HCT Not Measured  7.3*   < > 23.4* 26.1* 22.2* 22.5* 25.3* 25.6*  MCV Not Measured  125.9*   < > 92.9 95.6 96.9 97.8  --  95.5  PLT Not Measured  137*   < > 71* 79* 75* 81*  --  78*   < > = values in this interval not displayed.   Basic Metabolic Panel: Recent Labs  Lab 06/19/21 2225 06/20/21 0456 06/20/21 1610 06/21/21 0601 06/22/21 0024 06/23/21 0040 06/24/21 0013  NA  --  132* 133* 132* 131* 129* 131*  K  --  2.7* 3.5 3.8 3.5 3.3* 3.4*  CL  --  105 106 104 105 102 102  CO2  --  21* 20* 21* 20* 21* 20*  GLUCOSE  --  106* 117* 98 94 103* 109*  BUN  --  28* 24* 18 12 10 14   CREATININE  --  1.25* 0.96 0.83 0.66  0.61 0.75  CALCIUM  --  7.5* 7.4* 7.7* 7.2* 7.2* 7.3*  MG 2.2 2.0  --  1.7 1.4* 1.3* 1.9  PHOS 2.6  --   --   --   --   --   --    GFR: Estimated Creatinine Clearance: 93.6 mL/min (by C-G formula based on SCr of 0.75 mg/dL). Liver Function Tests: Recent Labs  Lab 06/19/21 0456 06/20/21 0456 06/21/21 0601 06/22/21 0024 06/23/21 0040  AST 119* 115* 124* 92* 86*  ALT 32 39 44 38 37  ALKPHOS 90 101 103 108 124  BILITOT 8.7* 8.2* 9.6* 7.9* 8.1*  PROT 7.4 7.2 7.5 6.6 6.8  ALBUMIN 1.6* 1.7* 1.6* 1.4*  1.4*   No results for input(s): LIPASE, AMYLASE in the last 168 hours. Recent Labs  Lab 06/17/21 1402 06/18/21 0223 06/21/21 0601  AMMONIA 82* 51* 31   Coagulation Profile: Recent Labs  Lab 06/17/21 1015 06/18/21 0223 06/18/21 0731  INR 2.3* 2.1* 2.1*   Cardiac Enzymes: No results for input(s): CKTOTAL, CKMB, CKMBINDEX, TROPONINI in the last 168 hours. BNP (last 3 results) No results for input(s): PROBNP in the last 8760 hours. HbA1C: No results for input(s): HGBA1C in the last 72 hours. CBG: Recent Labs  Lab 06/20/21 1642 06/20/21 1952 06/20/21 2350 06/21/21 0442 06/21/21 0721  GLUCAP 116* 106* 103* 90 96   Lipid Profile: No results for input(s): CHOL, HDL, LDLCALC, TRIG, CHOLHDL, LDLDIRECT in the last 72 hours. Thyroid Function Tests: No results for input(s): TSH, T4TOTAL, FREET4, T3FREE, THYROIDAB in the last 72 hours.  Anemia Panel: No results for input(s): VITAMINB12, FOLATE, FERRITIN, TIBC, IRON, RETICCTPCT in the last 72 hours. Urine analysis:    Component Value Date/Time   COLORURINE AMBER (A) 06/17/2021 1011   APPEARANCEUR HAZY (A) 06/17/2021 1011   LABSPEC 1.020 06/17/2021 1011   PHURINE 5.0 06/17/2021 1011   GLUCOSEU NEGATIVE 06/17/2021 1011   HGBUR NEGATIVE 06/17/2021 1011   BILIRUBINUR LARGE (A) 06/17/2021 1011   KETONESUR 15 (A) 06/17/2021 1011   PROTEINUR 30 (A) 06/17/2021 1011   NITRITE NEGATIVE 06/17/2021 1011   LEUKOCYTESUR MODERATE (A)  06/17/2021 1011   Recent Results (from the past 240 hour(s))  Urine Culture     Status: None   Collection Time: 06/17/21 10:11 AM   Specimen: In/Out Cath Urine  Result Value Ref Range Status   Specimen Description IN/OUT CATH URINE  Final   Special Requests NONE  Final   Culture   Final    NO GROWTH Performed at [facility], [address], [place], [place] [address]    Report Status 06/19/2021 FINAL  Final  Blood Culture (routine x 2)     Status: None   Collection Time: 06/17/21 10:15 AM   Specimen: BLOOD LEFT FOREARM  Result Value Ref Range Status   Specimen Description BLOOD LEFT FOREARM  Final   Special Requests   Final    BOTTLES DRAWN AEROBIC AND ANAEROBIC Blood Culture adequate volume   Culture   Final    NO GROWTH 5 DAYS Performed at [facility], [address], [place], [place] [address]    Report Status 06/22/2021 FINAL  Final  Blood Culture (routine x 2)     Status: None   Collection Time: 06/17/21 10:17 AM   Specimen: BLOOD  Result Value Ref Range Status   Specimen Description BLOOD SITE NOT SPECIFIED  Final   Special Requests   Final    BOTTLES DRAWN AEROBIC AND ANAEROBIC Blood Culture results may not be optimal due to an excessive volume of blood received in culture bottles   Culture   Final    NO GROWTH 5 DAYS Performed at [facility], [address], [place], [place] [address]    Report Status 06/22/2021 FINAL  Final  Resp Panel by RT-PCR (Flu A&B, Covid)     Status: None   Collection Time: 06/17/21  2:52 PM  Result Value Ref Range Status   SARS Coronavirus 2 by RT PCR NEGATIVE NEGATIVE Final    Comment: (NOTE) SARS-CoV-2 target nucleic acids are NOT DETECTED.  The SARS-CoV-2 RNA is generally detectable in upper respiratory specimens during the acute phase  of infection. The lowest concentration of SARS-CoV-2 viral copies this assay can detect is 138 copies/mL. A negative result does not preclude SARS-Cov-2 infection and should not  be used as the sole basis for treatment or other patient management decisions. A negative result may occur with  improper specimen collection/handling, submission of specimen other than nasopharyngeal swab, presence of viral mutation(s) within the areas targeted by this assay, and inadequate number of viral copies(<138 copies/mL). A negative result must be combined with clinical observations, patient history, and epidemiological information. The expected result is Negative.  Fact Sheet for Patients:  BloggerCourse.com  Fact Sheet for Healthcare Providers:  SeriousBroker.it  This test is no t yet approved or cleared by the [place] FDA and  has been authorized for detection and/or diagnosis of SARS-CoV-2 by FDA under an Emergency Use Authorization (EUA). This EUA will remain  in effect (meaning this test can be used) for the duration of the COVID-19 declaration under Section 564(b)(1) of the Act, 21 U.S.C.section 360bbb-3(b)(1), unless the authorization is terminated  or revoked sooner.       Influenza A by PCR NEGATIVE NEGATIVE Final   Influenza B by PCR NEGATIVE NEGATIVE Final    Comment: (NOTE) The Xpert Xpress SARS-CoV-2/FLU/RSV plus assay is intended as an aid in the diagnosis of influenza from Nasopharyngeal swab specimens and should not be used as a sole basis for treatment. Nasal washings and aspirates are unacceptable for Xpert Xpress SARS-CoV-2/FLU/RSV testing.  Fact Sheet for Patients: BloggerCourse.com  Fact Sheet for Healthcare Providers: SeriousBroker.it  This test is not yet approved or cleared by the [place] FDA and has been authorized for detection and/or diagnosis of SARS-CoV-2 by FDA under an Emergency Use Authorization (EUA). This EUA will remain in effect (meaning this test can be used) for the duration of the COVID-19 declaration under Section  564(b)(1) of the Act, 21 U.S.C. section 360bbb-3(b)(1), unless the authorization is terminated or revoked.  Performed at [facility], [address], [place], [place] [address]   Culture, Respiratory w Gram Stain     Status: None   Collection Time: 06/17/21 11:17 PM   Specimen: Tracheal Aspirate; Respiratory  Result Value Ref Range Status   Specimen Description TRACHEAL ASPIRATE  Final   Special Requests NONE  Final   Gram Stain   Final    NO SQUAMOUS EPITHELIAL CELLS SEEN FEW WBC PRESENT,BOTH PMN AND MONONUCLEAR NO ORGANISMS SEEN    Culture   Final    RARE Normal respiratory flora-no Staph aureus or Pseudomonas seen Performed at [facility], [address], [place], [place] [address]    Report Status 06/20/2021 FINAL  Final  MRSA Next Gen by PCR, Nasal     Status: Abnormal   Collection Time: 06/18/21  1:16 AM   Specimen: Nasal Mucosa; Nasal Swab  Result Value Ref Range Status   MRSA by PCR Next Gen DETECTED (A) NOT DETECTED Final    Comment: RESULT CALLED TO, READ BACK BY AND VERIFIED WITH: GERMILLION,H RN 06/18/2021 AT 0218 SKEEN,P (NOTE) The GeneXpert MRSA Assay (FDA approved for NASAL specimens only), is one component of a comprehensive MRSA colonization surveillance program. It is not intended to diagnose MRSA infection nor to guide or monitor treatment for MRSA infections. Test performance is not FDA approved in patients less than 59 years old. Performed at [facility], [address] [place], [place] [address]   Peritoneal fluid culture w Gram Stain  Status: None   Collection Time: 06/19/21  5:48 PM   Specimen: Peritoneal Fluid  Result Value Ref Range Status   Specimen Description FLUID PERITONEAL  Final   Special Requests NONE  Final   Gram Stain NO WBC SEEN NO ORGANISMS SEEN   Final   Culture   Final    NO GROWTH 3 DAYS Performed at [facility], [address] [place], [place] [address]    Report Status 06/23/2021 FINAL   Final      Radiology Studies: No results found.  Scheduled Meds:  sodium chloride   Intravenous Once   folic acid  1 mg Oral Daily   furosemide  80 mg Intravenous BID   Gerhardt's butt cream   Topical TID   hydrocortisone   Rectal Once   lactulose  10 g Oral BID   mouth rinse  15 mL Mouth Rinse BID   multivitamin with minerals  1 tablet Oral Daily   potassium chloride  40 mEq Oral BID   rifaximin  550 mg Oral BID   spironolactone  100 mg Oral Daily   thiamine  100 mg Oral Daily   Or   thiamine  100 mg Intravenous Daily   Continuous Infusions:  sodium chloride 10 mL/hr at 06/19/21 1731     LOS: 7 days   Time spent: 35 minutes.  Tyrone Nineyan B Roye Gustafson, MD [facility] [email or web address] 06/24/2021, 9:00 AM

## 2021-06-24 NOTE — Progress Notes (Signed)
Inpatient Rehab Admissions Coordinator:   Following for my colleague Ottie Glazier.  Note therapy recommendations updated to home health.  CIR will sign off at this time.   Estill Dooms, PT, DPT Admissions Coordinator [phone] 06/24/21  3:22 PM

## 2021-06-24 NOTE — Progress Notes (Addendum)
Physical Therapy Treatment Patient Details Name: Keyah Blizard MRN: [id number] DOB: 08-09-1986 Today's Date: 06/24/2021    History of Present Illness 35 y.o. female presenting to ED 7/25 with decreased responsiveness, poor p.o. intake, jaundice and BLE swelling. Patient admitted with acute hepatic encephalopathy/hyperammonemia, decompensated liver failure, suspected seizure, acute respiratory failure requiring mechanical ventilation secondary pulmonary edema +/- aspiration pneumonia s/p extubation 7/25, acute on chronic blood loss secondary to anemia septic shock and ETOH withdrawl. PMHx significant for ETOH use disorder and IDA.    PT Comments    Progressing well toward goals.  Emphasis on transitions, sit to stands, standing activity/balance during pericare and activity at the sink, ending with progression of gait stability/stamina in the halls.    Follow Up Recommendations  Home health PT;Other (comment);Supervision/Assistance - 24 hour     Equipment Recommendations  Rolling walker with 5" wheels    Recommendations for Other Services       Precautions / Restrictions Precautions Precautions: Fall    Mobility  Bed Mobility Overal bed mobility: Needs Assistance Bed Mobility: Supine to Sit     Supine to sit: Min assist     General bed mobility comments: assist with swollen LE's    Transfers Overall transfer level: Needs assistance Equipment used: Rolling walker (2 wheeled) Transfers: Sit to/from [employer] Sit to Stand: Min guard;Min assist Stand pivot transfers: Min assist       General transfer comment: min to come forward and boost.  Initially light mod to hold foward, but then min assist progressing to min guard standing at [facility] for peri care.  Ambulation/Gait Ambulation/Gait assistance: Min assist;Min guard Gait Distance (Feet): 100 Feet Assistive device: Rolling walker (2 wheeled) Gait Pattern/deviations: Step-through pattern Gait velocity:  slower Gait velocity interpretation: <1.8 ft/sec, indicate of risk for recurrent falls General Gait Details: Slow and mildly unsteady, still posterior bias that progressively improved during the time up.   Stairs             Wheelchair Mobility    Modified Rankin (Stroke Patients Only)       Balance   Sitting-balance support: No upper extremity supported;Single extremity supported;Feet supported Sitting balance-Leahy Scale: Fair     Standing balance support: Bilateral upper extremity supported;No upper extremity supported;During functional activity Standing balance-Leahy Scale: Fair Standing balance comment: Initially poor then improvement with time up.  Posterior bias improved during minutes up at [facility] during peri care x2 and then adjusting and pulling up her underwear/pants.                            Cognition Arousal/Alertness: Awake/alert Behavior During Therapy: WFL for tasks assessed/performed Overall Cognitive Status: Within Functional Limits for tasks assessed                                        Exercises      General Comments General comments (skin integrity, edema, etc.): vss on RA      Pertinent Vitals/Pain Pain Assessment: Faces Faces Pain Scale: Hurts little more Pain Location: rectum/general Pain Descriptors / Indicators: Sore;Grimacing Pain Intervention(s): Monitored during session    Home Living                      Prior Function            PT Goals (current goals  can now be found in the care plan section) Acute Rehab PT Goals Patient Stated Goal: be able to walk out of here PT Goal Formulation: With patient Time For Goal Achievement: 07/03/21 Potential to Achieve Goals: Good Progress towards PT goals: Progressing toward goals    Frequency    Min 3X/week      PT Plan Current plan remains appropriate    Co-evaluation PT/OT/SLP Co-Evaluation/Treatment: Yes Reason for Co-Treatment: To  address functional/ADL transfers PT goals addressed during session: Mobility/safety with mobility        AM-PAC PT "6 Clicks" Mobility   Outcome Measure  Help needed turning from your back to your side while in a flat bed without using bedrails?: A Little Help needed moving from lying on your back to sitting on the side of a flat bed without using bedrails?: A Little Help needed moving to and from a bed to a chair (including a wheelchair)?: A Little Help needed standing up from a chair using your arms (e.g., wheelchair or bedside chair)?: A Little Help needed to walk in hospital room?: A Little Help needed climbing 3-5 steps with a railing? : A Little 6 Click Score: 18    End of Session   Activity Tolerance: Patient tolerated treatment well;Patient limited by fatigue Patient left: in bed;with call bell/phone within reach;Other (comment) (sitting EOB) Nurse Communication: Mobility status PT Visit Diagnosis: Other abnormalities of gait and mobility (R26.89);Muscle weakness (generalized) (M62.81)     Time: 3664-4034 PT Time Calculation (min) (ACUTE ONLY): 39 min  Charges:  $Gait Training: 8-22 mins $Therapeutic Activity: 8-22 mins                     06/24/2021  Jacinto Halim., PT Acute Rehabilitation Services [phone]  (pager) [phone]  (office)   Eliseo Gum Elisheba Mcdonnell 06/24/2021, 11:35 AM

## 2021-06-24 NOTE — Progress Notes (Signed)
Occupational Therapy Treatment Patient Details Name: Michelle Little MRN: [id number] DOB: 03-24-1986 Today's Date: 06/24/2021    History of present illness 35 y.o. female presenting to ED 7/25 with decreased responsiveness, poor p.o. intake, jaundice and BLE swelling. Patient admitted with acute hepatic encephalopathy/hyperammonemia, decompensated liver failure, suspected seizure, acute respiratory failure requiring mechanical ventilation secondary pulmonary edema +/- aspiration pneumonia s/p extubation 7/25, acute on chronic blood loss secondary to anemia septic shock and ETOH withdrawl. PMHx significant for ETOH use disorder and IDA.   OT comments  Pt progressing well towards OT goals.  Pt with loose BM during session, but able to transfer to [facility] to complete bowel movement and toileting with up to min assist.  Transfers progressed from mod assist to min guard using RW, increased time required for all activities.  Requires max assist for LB dressing.  HH remains appropriate at this time.  Will follow.    Follow Up Recommendations  Home health OT;Supervision/Assistance - 24 hour    Equipment Recommendations  3 in 1 bedside commode    Recommendations for Other Services Rehab consult    Precautions / Restrictions Precautions Precautions: Fall Restrictions Weight Bearing Restrictions: No       Mobility Bed Mobility Overal bed mobility: Needs Assistance Bed Mobility: Supine to Sit     Supine to sit: Min assist     General bed mobility comments: assist with swollen LE's    Transfers Overall transfer level: Needs assistance Equipment used: Rolling walker (2 wheeled) Transfers: Sit to/from [employer] Sit to Stand: Min guard;Min assist Stand pivot transfers: Min assist       General transfer comment: initally mod assist but progressing quickly to min guard with mulitple transfers during pericare    Balance Overall balance assessment: Needs  assistance Sitting-balance support: No upper extremity supported;Feet supported Sitting balance-Leahy Scale: Fair     Standing balance support: Bilateral upper extremity supported;No upper extremity supported;During functional activity Standing balance-Leahy Scale: Fair Standing balance comment: Initially poor then improvement with time up.  Posterior bias improved during minutes up at [facility] during peri care x2 and then adjusting and pulling up her underwear/pants.                           ADL either performed or assessed with clinical judgement   ADL Overall ADL's : Needs assistance/impaired                     Lower Body Dressing: Maximal assistance;Sit to/from stand Lower Body Dressing Details (indicate cue type and reason): max assist to don socks and mesh underwear, pt able to pull underwear up with min assist for balance Toilet Transfer: Minimal assistance;Stand-pivot;BSC;RW   Toileting- Clothing Manipulation and Hygiene: Minimal assistance;Sit to/from stand Toileting - Clothing Manipulation Details (indicate cue type and reason): +BM on BSC     Functional mobility during ADLs: Rolling walker (mod assist fading to min guard, +2 for safety) General ADL Comments: Patient greatly limited by poor activity tolerance and generalized weakness/debility, pain in buttocks and LEs     Vision       Perception     Praxis      Cognition Arousal/Alertness: Awake/alert Behavior During Therapy: WFL for tasks assessed/performed Overall Cognitive Status: Within Functional Limits for tasks assessed  Exercises     Shoulder Instructions       General Comments vss on RA    Pertinent Vitals/ Pain       Pain Assessment: Faces Faces Pain Scale: Hurts little more Pain Location: rectum/general Pain Descriptors / Indicators: Sore;Grimacing Pain Intervention(s): Monitored during session  Home Living                                           Prior Functioning/Environment              Frequency  Min 2X/week        Progress Toward Goals  OT Goals(current goals can now be found in the care plan section)  Progress towards OT goals: Progressing toward goals  Acute Rehab OT Goals Patient Stated Goal: be able to walk out of here OT Goal Formulation: With patient  Plan Discharge plan remains appropriate;Frequency remains appropriate    Co-evaluation    PT/OT/SLP Co-Evaluation/Treatment: Yes Reason for Co-Treatment: To address functional/ADL transfers;For patient/therapist safety PT goals addressed during session: Mobility/safety with mobility OT goals addressed during session: ADL's and self-care      AM-PAC OT "6 Clicks" Daily Activity     Outcome Measure   Help from another person eating meals?: A Little Help from another person taking care of personal grooming?: A Little Help from another person toileting, which includes using toliet, bedpan, or urinal?: A Little Help from another person bathing (including washing, rinsing, drying)?: A Lot Help from another person to put on and taking off regular upper body clothing?: A Little Help from another person to put on and taking off regular lower body clothing?: A Lot 6 Click Score: 16    End of Session Equipment Utilized During Treatment: Rolling walker  OT Visit Diagnosis: Unsteadiness on feet (R26.81);Other abnormalities of gait and mobility (R26.89);Muscle weakness (generalized) (M62.81)   Activity Tolerance Patient tolerated treatment well   Patient Left in bed;with call bell/phone within reach;with bed alarm set;Other (comment) (seated EOB)   Nurse Communication Mobility status        Time: 3220-2542 OT Time Calculation (min): 41 min  Charges: OT General Charges $OT Visit: 1 Visit OT Treatments $Self Care/Home Management : 8-22 mins  Barry Brunner, OT Acute Rehabilitation Services Pager  [phone] Office [phone]    Chancy Milroy 06/24/2021, 12:09 PM

## 2021-06-24 NOTE — Plan of Care (Signed)
  Problem: Clinical Measurements: Goal: Ability to maintain clinical measurements within normal limits will improve Outcome: Not Progressing   Problem: Activity: Goal: Risk for activity intolerance will decrease Outcome: Not Progressing   Problem: Nutrition: Goal: Adequate nutrition will be maintained Outcome: Not Progressing   Problem: Coping: Goal: Level of anxiety will decrease Outcome: Not Progressing   Problem: Pain Managment: Goal: General experience of comfort will improve Outcome: Not Progressing

## 2021-06-25 LAB — BASIC METABOLIC PANEL
Anion gap: 8 (ref 5–15)
BUN: 15 mg/dL (ref 6–20)
CO2: 21 mmol/L — ABNORMAL LOW (ref 22–32)
Calcium: 7.6 mg/dL — ABNORMAL LOW (ref 8.9–10.3)
Chloride: 103 mmol/L (ref 98–111)
Creatinine, Ser: 0.86 mg/dL (ref 0.44–1.00)
GFR, Estimated: >60 mL/min (ref >60)
Glucose, Bld: 100 mg/dL — ABNORMAL HIGH (ref 70–99)
Potassium: 3.3 mmol/L — ABNORMAL LOW (ref 3.5–5.1)
Sodium: 132 mmol/L — ABNORMAL LOW (ref 135–145)

## 2021-06-25 LAB — CBC
HCT: 24.7 % — ABNORMAL LOW (ref 36.0–46.0)
Hemoglobin: 8.2 g/dL — ABNORMAL LOW (ref 12.0–15.0)
MCH: 32 pg (ref 26.0–34.0)
MCHC: 33.2 g/dL (ref 30.0–36.0)
MCV: 96.5 fL (ref 80.0–100.0)
Platelets: 80 10*3/uL — ABNORMAL LOW (ref 150–400)
RBC: 2.56 MIL/uL — ABNORMAL LOW (ref 3.87–5.11)
RDW: 26.9 % — ABNORMAL HIGH (ref 11.5–15.5)
WBC: 15.5 10*3/uL — ABNORMAL HIGH (ref 4.0–10.5)
nRBC: 0.3 % — ABNORMAL HIGH (ref 0.0–0.2)

## 2021-06-25 LAB — MAGNESIUM: Magnesium: 1.4 mg/dL — ABNORMAL LOW (ref 1.7–2.4)

## 2021-06-25 MED ORDER — POTASSIUM CHLORIDE CRYS ER 20 MEQ PO TBCR
40.0000 meq | EXTENDED_RELEASE_TABLET | Freq: Three times a day (TID) | ORAL | Status: DC
  Administered 2021-06-25 (×3): 40 meq via ORAL
  Filled 2021-06-25 (×3): qty 2

## 2021-06-25 MED ORDER — MAGNESIUM SULFATE 4 GM/100ML IV SOLN
4.0000 g | Freq: Once | INTRAVENOUS | Status: AC
  Administered 2021-06-25: 4 g via INTRAVENOUS
  Filled 2021-06-25: qty 100

## 2021-06-25 NOTE — Progress Notes (Signed)
PROGRESS NOTE  Sulema Braid  IWO:[id number] DOB: Aug 07, 1986 DOA: 06/17/2021 PCP: Pcp, No   Brief Narrative: Isadore Bokhari is a 35 y.o. female with a history of EtOH cirrhosis, tobacco use who presented from [facility] due to somnolence, unresponsiveness on 7/25 requiring intubation on arrival, subsequently extubated 7/26. Initial hemoglobin noted to be 1.8 for which 5u PRBCs were given and GI consulted, though FOBT was negative. Treatment with lactulose and rifaximin for hepatic encephalopathy complicated by diarrhea has improved mentation significantly. Paracentesis 7/27 showed no SBP, transudate consistent with portal HTN. Doxycycline has been given for pneumonia and lasix has been given for persistent volume overload. Spironolactone added.   Assessment & Plan: Active Problems:   Acute encephalopathy   Anemia   Hepatic encephalopathy (HCC)   Acute renal failure (HCC)   Other ascites   Cirrhosis (HCC)  Acute hepatic encephalopathy: Improving. CT head nonacute.  - Continue rifaximin.  - Continue lactulose at current lower dosing given clearing of confusion and improvement in stool quantity (still at goal of 3-5 BM/day.  - Delirium precautions to continue  Decompensated alcoholic cirrhosis with ascites, hypoalbuminemia, thrombocytopenia, coagulopathy: MELD-Na score is 27. Hepatitis panel negative.  - s/p paracentesis 7/27 by PCCM negative for SBP. Ascitic fluid albumin <1. Could probably repeat prior to discharge given recurrent abdominal distention, though pt is taking po adequately and not overly concerned at this time.  - Increase lasix to 80mg  IV BID. Pt reports subjectively robust UOP which has not been documented. Weights trending down significantly and exam improving (though remains severely edematous). Cr creeping upward with widening of BUN:Cr. Will continue this dose lasix for now and continue monitoring.   - Monitor weights, I/O.  - Continue spironolactone 100mg   (added 8/1)  Symptomatic macrocytic anemia: FOBT negative. Ferritin 52, iron 41, TIBC low 193. B12 1,496.  - Hgb improved with additional 1u PRBCs 7/31. Also s/p 5u at admission. remains <8g/dl without acute drop after 5u PRBCs on 7/25.  - No endoscopic evaluation currently planned.    Folic acid deficiency:  - Continue supplementation.   CAP: Completed 5 days abx  Leukocytosis: Persistent, unclear chronicity.  - Pt remains without focal infectious nidus and afebrile, so will not escalate antimicrobials at this time.   Lactic acidosis: Resolved, level was 10. Suspect impaired hepatic clearance in addition to infection as etiology.   Hypokalemia:  - Augment K supp to tID and continue monitoring. Anticipate improvement with spironolactone.   Hypomagnesemia:  - Supplement again with 4g IV today and continue monitoring   AKI: Resolved with fluid + albumin (pre-renal FENa), now with evidence of overload so attempting diuresis as above.  - Creatinine stable, BUN widening a bit, will continue IV diuresis as above and regular metabolic panel monitoring.   Asymptomatic bacteriuria: Negative urine culture.   Alcohol abuse: Given ativan for possible withdrawal earlier in hospitalization which caused AMS. No evidence of withdrawal at this time.  - Avoid ativan, etc.  - Cessation counseling  No glycemic excursions, stop checking CBGs.   DVT prophylaxis: SCDs Code Status: Full Family Communication: None at bedside Disposition Plan:  Status is: Inpatient  Remains inpatient appropriate because:Inpatient level of care appropriate due to severity of illness  Dispo: The patient is from: Home              Anticipated d/c is to: Home - PT/OT updated recommendations based on improved mobility due in large part to improved volume status. Will return to [facility] at DC.  Will need Rx's sent to [facility]. [facility].               Patient currently is not medically stable to d/c.    Difficult to place patient No  Consultants:  GI PCCM  Procedures:  Paracentesis 7/27  Antimicrobials: Doxycycline 7/27 - 7/29 Vancomycin, cefepime, flagyl 7/25 Ceftriaxone, azithromycin 7/25 - 7/26 Rifaximin 7/28 >>  Subjective: Reports significant UOP and getting up with assist to [facility] (transfer 1 step from bed) and reports output doesn't seem to be getting measured. No chest pain or dyspnea, leg swelling consistently improving but remains severe. Having trouble replacing IV this AM.  Objective: Vitals:   06/24/21 2008 06/24/21 2330 06/25/21 0430 06/25/21 0730  BP: 112/80 107/62 101/64 109/66  Pulse: 98 93 95   Resp: (!) 24 19 (!) 25 16  Temp: 98.3 F (36.8 C) (!) 97.5 F (36.4 C) 98.3 F (36.8 C) 98.7 F (37.1 C)  TempSrc: Oral Oral Oral Oral  SpO2: 98% 97% 99%   Weight:   70 kg   Height:        Intake/Output Summary (Last 24 hours) at 06/25/2021 1001 Last data filed at 06/25/2021 0007 Gross per 24 hour  Intake 236 ml  Output 550 ml  Net -314 ml   Filed Weights   06/23/21 0515 06/24/21 0542 06/25/21 0430  Weight: 77.1 kg 72 kg 70 kg   Gen: 35 y.o. female in no distress Pulm: Nonlabored breathing room air. Clear. CV: Regular rate and rhythm. No murmur, rub, or gallop. No JVD, modestly improved but still 2+ pitting dependent predominant edema. dependent edema. GI: Abdomen soft, non-tender, non-distended, with normoactive bowel sounds.  Ext: Warm, no deformities Skin: No new rashes, lesions or ulcers on visualized skin. Continued jaundice. Neuro: Alert and oriented. No asterixis elicited today. focal neurological deficits. Psych: Judgement and insight appear fair. Mood euthymic & affect congruent. Behavior is appropriate.    Data Reviewed: I have personally reviewed following labs and imaging studies  CBC: Recent Labs  Lab 06/21/21 0601 06/22/21 0024 06/23/21 0040 06/23/21 1915 06/24/21 0013 06/25/21 0114  WBC 16.7* 16.5* 17.1*  --  16.3* 15.5*  HGB 9.0*  7.4* 7.3* 8.3* 8.5* 8.2*  HCT 26.1* 22.2* 22.5* 25.3* 25.6* 24.7*  MCV 95.6 96.9 97.8  --  95.5 96.5  PLT 79* 75* 81*  --  78* 80*   Basic Metabolic Panel: Recent Labs  Lab 06/19/21 2225 06/20/21 0456 06/21/21 0601 06/22/21 0024 06/23/21 0040 06/24/21 0013 06/25/21 0114  NA  --    < > 132* 131* 129* 131* 132*  K  --    < > 3.8 3.5 3.3* 3.4* 3.3*  CL  --    < > 104 105 102 102 103  CO2  --    < > 21* 20* 21* 20* 21*  GLUCOSE  --    < > 98 94 103* 109* 100*  BUN  --    < > 18 12 10 14 15   CREATININE  --    < > 0.83 0.66 0.61 0.75 0.86  CALCIUM  --    < > 7.7* 7.2* 7.2* 7.3* 7.6*  MG 2.2   < > 1.7 1.4* 1.3* 1.9 1.4*  PHOS 2.6  --   --   --   --   --   --    < > = values in this interval not displayed.   GFR: Estimated Creatinine Clearance: 86 mL/min (by C-G formula based  on SCr of 0.86 mg/dL). Liver Function Tests: Recent Labs  Lab 06/19/21 0456 06/20/21 0456 06/21/21 0601 06/22/21 0024 06/23/21 0040  AST 119* 115* 124* 92* 86*  ALT 32 39 44 38 37  ALKPHOS 90 101 103 108 124  BILITOT 8.7* 8.2* 9.6* 7.9* 8.1*  PROT 7.4 7.2 7.5 6.6 6.8  ALBUMIN 1.6* 1.7* 1.6* 1.4* 1.4*   No results for input(s): LIPASE, AMYLASE in the last 168 hours. Recent Labs  Lab 06/21/21 0601  AMMONIA 31   Coagulation Profile: No results for input(s): INR, PROTIME in the last 168 hours.  Cardiac Enzymes: No results for input(s): CKTOTAL, CKMB, CKMBINDEX, TROPONINI in the last 168 hours. BNP (last 3 results) No results for input(s): PROBNP in the last 8760 hours. HbA1C: No results for input(s): HGBA1C in the last 72 hours. CBG: Recent Labs  Lab 06/20/21 1642 06/20/21 1952 06/20/21 2350 06/21/21 0442 06/21/21 0721  GLUCAP 116* 106* 103* 90 96   Lipid Profile: No results for input(s): CHOL, HDL, LDLCALC, TRIG, CHOLHDL, LDLDIRECT in the last 72 hours. Thyroid Function Tests: No results for input(s): TSH, T4TOTAL, FREET4, T3FREE, THYROIDAB in the last 72 hours.  Anemia Panel: No  results for input(s): VITAMINB12, FOLATE, FERRITIN, TIBC, IRON, RETICCTPCT in the last 72 hours. Urine analysis:    Component Value Date/Time   COLORURINE AMBER (A) 06/17/2021 1011   APPEARANCEUR HAZY (A) 06/17/2021 1011   LABSPEC 1.020 06/17/2021 1011   PHURINE 5.0 06/17/2021 1011   GLUCOSEU NEGATIVE 06/17/2021 1011   HGBUR NEGATIVE 06/17/2021 1011   BILIRUBINUR LARGE (A) 06/17/2021 1011   KETONESUR 15 (A) 06/17/2021 1011   PROTEINUR 30 (A) 06/17/2021 1011   NITRITE NEGATIVE 06/17/2021 1011   LEUKOCYTESUR MODERATE (A) 06/17/2021 1011   Recent Results (from the past 240 hour(s))  Urine Culture     Status: None   Collection Time: 06/17/21 10:11 AM   Specimen: In/Out Cath Urine  Result Value Ref Range Status   Specimen Description IN/OUT CATH URINE  Final   Special Requests NONE  Final   Culture   Final    NO GROWTH Performed at [facility], [address] [place], [place] [address]    Report Status 06/19/2021 FINAL  Final  Blood Culture (routine x 2)     Status: None   Collection Time: 06/17/21 10:15 AM   Specimen: BLOOD LEFT FOREARM  Result Value Ref Range Status   Specimen Description BLOOD LEFT FOREARM  Final   Special Requests   Final    BOTTLES DRAWN AEROBIC AND ANAEROBIC Blood Culture adequate volume   Culture   Final    NO GROWTH 5 DAYS Performed at [facility], [address] [place], [place] [address]    Report Status 06/22/2021 FINAL  Final  Blood Culture (routine x 2)     Status: None   Collection Time: 06/17/21 10:17 AM   Specimen: BLOOD  Result Value Ref Range Status   Specimen Description BLOOD SITE NOT SPECIFIED  Final   Special Requests   Final    BOTTLES DRAWN AEROBIC AND ANAEROBIC Blood Culture results may not be optimal due to an excessive volume of blood received in culture bottles   Culture   Final    NO GROWTH 5 DAYS Performed at [facility], [address] [place], [place] [address]    Report Status 06/22/2021 FINAL   Final  Resp Panel by RT-PCR (Flu A&B, Covid)     Status:  None   Collection Time: 06/17/21  2:52 PM  Result Value Ref Range Status   SARS Coronavirus 2 by RT PCR NEGATIVE NEGATIVE Final    Comment: (NOTE) SARS-CoV-2 target nucleic acids are NOT DETECTED.  The SARS-CoV-2 RNA is generally detectable in upper respiratory specimens during the acute phase of infection. The lowest concentration of SARS-CoV-2 viral copies this assay can detect is 138 copies/mL. A negative result does not preclude SARS-Cov-2 infection and should not be used as the sole basis for treatment or other patient management decisions. A negative result may occur with  improper specimen collection/handling, submission of specimen other than nasopharyngeal swab, presence of viral mutation(s) within the areas targeted by this assay, and inadequate number of viral copies(<138 copies/mL). A negative result must be combined with clinical observations, patient history, and epidemiological information. The expected result is Negative.  Fact Sheet for Patients:  BloggerCourse.com  Fact Sheet for Healthcare Providers:  SeriousBroker.it  This test is no t yet approved or cleared by the [place] FDA and  has been authorized for detection and/or diagnosis of SARS-CoV-2 by FDA under an Emergency Use Authorization (EUA). This EUA will remain  in effect (meaning this test can be used) for the duration of the COVID-19 declaration under Section 564(b)(1) of the Act, 21 U.S.C.section 360bbb-3(b)(1), unless the authorization is terminated  or revoked sooner.       Influenza A by PCR NEGATIVE NEGATIVE Final   Influenza B by PCR NEGATIVE NEGATIVE Final    Comment: (NOTE) The Xpert Xpress SARS-CoV-2/FLU/RSV plus assay is intended as an aid in the diagnosis of influenza from Nasopharyngeal swab specimens and should not be used as a sole basis for treatment. Nasal washings  and aspirates are unacceptable for Xpert Xpress SARS-CoV-2/FLU/RSV testing.  Fact Sheet for Patients: BloggerCourse.com  Fact Sheet for Healthcare Providers: SeriousBroker.it  This test is not yet approved or cleared by the [place] FDA and has been authorized for detection and/or diagnosis of SARS-CoV-2 by FDA under an Emergency Use Authorization (EUA). This EUA will remain in effect (meaning this test can be used) for the duration of the COVID-19 declaration under Section 564(b)(1) of the Act, 21 U.S.C. section 360bbb-3(b)(1), unless the authorization is terminated or revoked.  Performed at [facility], [address] [place], [place] [address]   Culture, Respiratory w Gram Stain     Status: None   Collection Time: 06/17/21 11:17 PM   Specimen: Tracheal Aspirate; Respiratory  Result Value Ref Range Status   Specimen Description TRACHEAL ASPIRATE  Final   Special Requests NONE  Final   Gram Stain   Final    NO SQUAMOUS EPITHELIAL CELLS SEEN FEW WBC PRESENT,BOTH PMN AND MONONUCLEAR NO ORGANISMS SEEN    Culture   Final    RARE Normal respiratory flora-no Staph aureus or Pseudomonas seen Performed at [facility], [address] [place], [place] [address]    Report Status 06/20/2021 FINAL  Final  MRSA Next Gen by PCR, Nasal     Status: Abnormal   Collection Time: 06/18/21  1:16 AM   Specimen: Nasal Mucosa; Nasal Swab  Result Value Ref Range Status   MRSA by PCR Next Gen DETECTED (A) NOT DETECTED Final    Comment: RESULT CALLED TO, READ BACK BY AND VERIFIED WITH: GERMILLION,H RN 06/18/2021 AT 0218 SKEEN,P (NOTE) The GeneXpert MRSA Assay (FDA approved for NASAL specimens only), is one component of a comprehensive MRSA colonization surveillance program. It is not  intended to diagnose MRSA infection nor to guide or monitor treatment for MRSA infections. Test performance is not FDA approved in patients less  than 68 years old. Performed at [facility], [address] [place], [place] [address]   Peritoneal fluid culture w Gram Stain     Status: None   Collection Time: 06/19/21  5:48 PM   Specimen: Peritoneal Fluid  Result Value Ref Range Status   Specimen Description FLUID PERITONEAL  Final   Special Requests NONE  Final   Gram Stain NO WBC SEEN NO ORGANISMS SEEN   Final   Culture   Final    NO GROWTH 3 DAYS Performed at [facility], [address], [place], [place] [address]    Report Status 06/23/2021 FINAL  Final      Radiology Studies: No results found.  Scheduled Meds:  sodium chloride   Intravenous Once   folic acid  1 mg Oral Daily   furosemide  80 mg Intravenous BID   Gerhardt's butt cream   Topical TID   hydrocortisone   Rectal Once   lactulose  10 g Oral BID   mouth rinse  15 mL Mouth Rinse BID   multivitamin with minerals  1 tablet Oral Daily   potassium chloride  40 mEq Oral TID   rifaximin  550 mg Oral BID   spironolactone  100 mg Oral Daily   thiamine  100 mg Oral Daily   Or   thiamine  100 mg Intravenous Daily   Continuous Infusions:  sodium chloride 10 mL/hr at 06/19/21 1731   magnesium sulfate bolus IVPB 4 g (06/25/21 0947)     LOS: 8 days   Time spent: 35 minutes.  Tyrone Nine, MD [facility] [email or web address] 06/25/2021, 10:01 AM

## 2021-06-25 NOTE — TOC Progression Note (Addendum)
Transition of Care [facility]) - Progression Note    Patient Details  Name: Michelle Little MRN: [id number] Date of Birth: 04-Dec-1985  Transition of Care [employer]) CM/SW Contact  Leone Haven, RN Phone Number: 06/25/2021, 9:51 AM  Clinical Narrative:    NCM made charity referral for DME to [employer] on 8/1. NCM made charity referral for HHPT, HHOT, to Stacie with [facility]. Awaiting to hear back. Patient states she will need transportation ast at dc.  She will need med ast at dc also.  She lives in the [employer] with her family on [place]. Per Brandon with [facility] they are able to take this patient for charity for HHPT, HHOT.          Expected Discharge Plan and Services                                                 Social Determinants of Health (SDOH) Interventions    Readmission Risk Interventions No flowsheet data found.

## 2021-06-25 NOTE — Plan of Care (Signed)
  Problem: Clinical Measurements: Goal: Ability to maintain clinical measurements within normal limits will improve Outcome: Not Progressing   Problem: Coping: Goal: Level of anxiety will decrease Outcome: Not Progressing   Problem: Pain Managment: Goal: General experience of comfort will improve Outcome: Not Progressing   

## 2021-06-26 LAB — BASIC METABOLIC PANEL
Anion gap: 7 (ref 5–15)
BUN: 14 mg/dL (ref 6–20)
CO2: 21 mmol/L — ABNORMAL LOW (ref 22–32)
Calcium: 7.6 mg/dL — ABNORMAL LOW (ref 8.9–10.3)
Chloride: 103 mmol/L (ref 98–111)
Creatinine, Ser: 0.74 mg/dL (ref 0.44–1.00)
GFR, Estimated: >60 mL/min (ref >60)
Glucose, Bld: 87 mg/dL (ref 70–99)
Potassium: 4.4 mmol/L (ref 3.5–5.1)
Sodium: 131 mmol/L — ABNORMAL LOW (ref 135–145)

## 2021-06-26 LAB — MAGNESIUM: Magnesium: 1.2 mg/dL — ABNORMAL LOW (ref 1.7–2.4)

## 2021-06-26 MED ORDER — LACTULOSE 10 GM/15ML PO SOLN
10.0000 g | Freq: Every day | ORAL | Status: DC
  Administered 2021-06-27 – 2021-06-28 (×2): 10 g via ORAL
  Filled 2021-06-26 (×2): qty 15

## 2021-06-26 MED ORDER — POTASSIUM CHLORIDE CRYS ER 20 MEQ PO TBCR
40.0000 meq | EXTENDED_RELEASE_TABLET | Freq: Every day | ORAL | Status: DC
  Administered 2021-06-26: 40 meq via ORAL
  Filled 2021-06-26: qty 2

## 2021-06-26 MED ORDER — MAGNESIUM SULFATE 4 GM/100ML IV SOLN
4.0000 g | Freq: Once | INTRAVENOUS | Status: AC
  Administered 2021-06-26: 4 g via INTRAVENOUS
  Filled 2021-06-26: qty 100

## 2021-06-26 MED ORDER — FUROSEMIDE 10 MG/ML IJ SOLN
120.0000 mg | Freq: Two times a day (BID) | INTRAVENOUS | Status: DC
  Administered 2021-06-26 – 2021-06-30 (×9): 120 mg via INTRAVENOUS
  Filled 2021-06-26: qty 2
  Filled 2021-06-26 (×3): qty 12
  Filled 2021-06-26: qty 10
  Filled 2021-06-26: qty 12
  Filled 2021-06-26 (×2): qty 10
  Filled 2021-06-26 (×3): qty 12

## 2021-06-26 MED ORDER — MAGNESIUM OXIDE -MG SUPPLEMENT 400 (240 MG) MG PO TABS
800.0000 mg | ORAL_TABLET | Freq: Two times a day (BID) | ORAL | Status: DC
  Administered 2021-06-26 – 2021-06-30 (×9): 800 mg via ORAL
  Filled 2021-06-26 (×9): qty 2

## 2021-06-26 NOTE — Progress Notes (Signed)
Physical Therapy Treatment Patient Details Name: Michelle Little MRN: [id number] DOB: 01-16-1986 Today's Date: 06/26/2021    History of Present Illness 35 y.o. female presenting to ED 7/25 with decreased responsiveness, poor p.o. intake, jaundice and BLE swelling. Patient admitted with acute hepatic encephalopathy/hyperammonemia, decompensated liver failure, suspected seizure, acute respiratory failure requiring mechanical ventilation secondary pulmonary edema +/- aspiration pneumonia s/p extubation 7/25, acute on chronic blood loss secondary to anemia septic shock and ETOH withdrawl. PMHx significant for ETOH use disorder and IDA.    PT Comments    Pt required min guard assist transfers and ambulation 180' with RW. Pt continues to demonstrate good progress with mobility. Continued edema noted BLE. Pt in recliner with feet elevated at end of session.    Follow Up Recommendations  Home health PT;Other (comment);Supervision/Assistance - 24 hour     Equipment Recommendations  Rolling walker with 5" wheels    Recommendations for Other Services       Precautions / Restrictions Precautions Precautions: Fall Restrictions Weight Bearing Restrictions: No    Mobility  Bed Mobility               General bed mobility comments: Pt received sitting EOB.    Transfers Overall transfer level: Needs assistance Equipment used: Rolling walker (2 wheeled) Transfers: Sit to/from Stand Sit to Stand: Min guard Stand pivot transfers: Min guard       General transfer comment: min guard for safety and balance  Ambulation/Gait Ambulation/Gait assistance: Min guard Gait Distance (Feet): 180 Feet Assistive device: Rolling walker (2 wheeled) Gait Pattern/deviations: Step-through pattern Gait velocity: decreased Gait velocity interpretation: <1.31 ft/sec, indicative of household ambulator General Gait Details: slow, steady gait with RW   Stairs             Wheelchair Mobility     Modified Rankin (Stroke Patients Only)       Balance Overall balance assessment: Needs assistance Sitting-balance support: No upper extremity supported;Feet supported Sitting balance-Leahy Scale: Fair     Standing balance support: Bilateral upper extremity supported;During functional activity;No upper extremity supported Standing balance-Leahy Scale: Fair Standing balance comment: reliant on RW for amb, static stand without UE support                            Cognition Arousal/Alertness: Awake/alert Behavior During Therapy: WFL for tasks assessed/performed Overall Cognitive Status: Within Functional Limits for tasks assessed                                        Exercises      General Comments General comments (skin integrity, edema, etc.): VSS on RA. HR into 110s during amb.      Pertinent Vitals/Pain Pain Assessment: No/denies pain    Home Living                      Prior Function            PT Goals (current goals can now be found in the care plan section) Acute Rehab PT Goals Patient Stated Goal: be able to walk out of here Progress towards PT goals: Progressing toward goals    Frequency    Min 3X/week      PT Plan Current plan remains appropriate    Co-evaluation  AM-PAC PT "6 Clicks" Mobility   Outcome Measure  Help needed turning from your back to your side while in a flat bed without using bedrails?: A Little Help needed moving from lying on your back to sitting on the side of a flat bed without using bedrails?: A Little Help needed moving to and from a bed to a chair (including a wheelchair)?: A Little Help needed standing up from a chair using your arms (e.g., wheelchair or bedside chair)?: A Little Help needed to walk in hospital room?: A Little Help needed climbing 3-5 steps with a railing? : A Little 6 Click Score: 18    End of Session Equipment Utilized During Treatment: Gait  belt Activity Tolerance: Patient tolerated treatment well Patient left: in chair;with call bell/phone within reach Nurse Communication: Mobility status PT Visit Diagnosis: Other abnormalities of gait and mobility (R26.89);Muscle weakness (generalized) (M62.81)     Time: 8786-7672 PT Time Calculation (min) (ACUTE ONLY): 24 min  Charges:  $Gait Training: 23-37 mins                     Aida Raider, [place]  Office # [phone] Pager [phone]    Ilda Foil 06/26/2021, 12:56 PM

## 2021-06-26 NOTE — Progress Notes (Signed)
PROGRESS NOTE  Michelle Little  EXB:[id number] DOB: 27-May-1986 DOA: 06/17/2021 PCP: Pcp, No   Brief Narrative: Michelle Little is a 35 y.o. female with a history of EtOH cirrhosis, tobacco use who presented from [facility] due to somnolence, unresponsiveness on 7/25 requiring intubation on arrival, subsequently extubated 7/26. Initial hemoglobin noted to be 1.8 for which 5u PRBCs were given and GI consulted, though FOBT was negative. Treatment with lactulose and rifaximin for hepatic encephalopathy complicated by diarrhea has improved mentation significantly. Paracentesis 7/27 showed no SBP, transudate consistent with portal HTN. Doxycycline has been given for pneumonia and lasix has been given for persistent volume overload. Spironolactone added.   Assessment & Plan: Active Problems:   Acute encephalopathy   Anemia   Hepatic encephalopathy (HCC)   Acute renal failure (HCC)   Other ascites   Cirrhosis (HCC)  Acute hepatic encephalopathy: Improving. CT head nonacute.  - Continue rifaximin.  - Continue lactulose, decrease dose with greater than goal BMs.    - Delirium precautions to continue  Decompensated alcoholic cirrhosis with ascites, hypoalbuminemia, thrombocytopenia, coagulopathy: MELD-Na score is 27. Hepatitis panel negative.  - s/p paracentesis 7/27 by PCCM negative for SBP. Ascitic fluid albumin <1. Could probably repeat prior to discharge given recurrent abdominal distention, though pt is taking po adequately and not overly concerned at this time.  - Increase lasix to 120mg  IV BID. Minimal UOP documented, though pt states she's urinating frequently. Weight is up today. Cr stable.   - Monitor weights, I/O.  - Continue spironolactone 100mg  (added 8/1)  Symptomatic macrocytic anemia: FOBT negative. Ferritin 52, iron 41, TIBC low 193. B12 1,496.  - Hgb improved with additional 1u PRBCs 7/31. Also s/p 5u at admission. - No endoscopic evaluation currently planned.     Folic acid deficiency:  - Continue supplementation.   CAP: Completed 5 days abx  Leukocytosis: Persistent, unclear chronicity.  - Pt remains without focal infectious nidus and afebrile, so will not escalate antimicrobials at this time.   Lactic acidosis: Resolved, level was 10. Suspect impaired hepatic clearance in addition to infection as etiology.   Hypokalemia:  - K 4.4 this AM, perhaps spiro effect. Will deescalate but continue supplementation, continue monitoring.  Hypomagnesemia:  - Severe and refractory. Supplement by IV and po and monitor.   AKI: Resolved with fluid + albumin (pre-renal FENa), now with evidence of overload so attempting diuresis as above.  - Creatinine stable, BUN widening a bit, will continue IV diuresis as above and regular metabolic panel monitoring.   Asymptomatic bacteriuria: Negative urine culture.   Alcohol abuse: Given ativan for possible withdrawal earlier in hospitalization which caused AMS. No evidence of withdrawal at this time.  - Avoid ativan, etc.  - Cessation counseling  No glycemic excursions, stop checking CBGs.   DVT prophylaxis: SCDs Code Status: Full Family Communication: None at bedside Disposition Plan:  Status is: Inpatient  Remains inpatient appropriate because:Inpatient level of care appropriate due to severity of illness  Dispo: The patient is from: Home              Anticipated d/c is to: Home - PT/OT updated recommendations based on improved mobility due in large part to improved volume status. Will return to [facility] at DC. Will need Rx's sent to [facility]. [facility] arranged.               Patient currently is not medically stable to d/c.   Difficult to place patient No  Consultants:  GI  PCCM  Procedures:  Paracentesis 7/27  Antimicrobials: Doxycycline 7/27 - 7/29 Vancomycin, cefepime, flagyl 7/25 Ceftriaxone, azithromycin 7/25 - 7/26 Rifaximin 7/28 >>  Subjective: Having >5 BMs per day all liquid, at  times unable to get to [facility]. No change in leg swelling and it continues to limit her mobility. No improvement when they're kept elevated. Abdominal swelling is stable. No dyspnea.   Objective: Vitals:   06/26/21 0508 06/26/21 0729 06/26/21 0935 06/26/21 1143  BP: 96/64 111/79  104/74  Pulse: 92 98  90  Resp: 20 19  20   Temp: 97.9 F (36.6 C) 97.9 F (36.6 C)  98.7 F (37.1 C)  TempSrc: Oral Oral  Oral  SpO2: 96% 100%  97%  Weight:   71.4 kg   Height:        Intake/Output Summary (Last 24 hours) at 06/26/2021 1340 Last data filed at 06/26/2021 0933 Gross per 24 hour  Intake 480 ml  Output 750 ml  Net -270 ml   Filed Weights   06/24/21 0542 06/25/21 0430 06/26/21 0935  Weight: 72 kg 70 kg 71.4 kg   Gen: 35 y.o. female in no distress Pulm: Nonlabored breathing room air. Clear. CV: Regular rate and rhythm. No murmur, rub, or gallop. No JVD, 2+ pitting dependent edema. GI: Abdomen soft, w/fluid wave, distended, nontender   Ext: Warm, no deformities Skin: No new rashes, lesions or ulcers on visualized skin. Appears jaundiced. Neuro: Alert and oriented. No asterixis. No focal neurological deficits. Psych: Judgement and insight appear fair. Mood euthymic & affect congruent. Behavior is appropriate.    Data Reviewed: I have personally reviewed following labs and imaging studies  CBC: Recent Labs  Lab 06/21/21 0601 06/22/21 0024 06/23/21 0040 06/23/21 1915 06/24/21 0013 06/25/21 0114  WBC 16.7* 16.5* 17.1*  --  16.3* 15.5*  HGB 9.0* 7.4* 7.3* 8.3* 8.5* 8.2*  HCT 26.1* 22.2* 22.5* 25.3* 25.6* 24.7*  MCV 95.6 96.9 97.8  --  95.5 96.5  PLT 79* 75* 81*  --  78* 80*   Basic Metabolic Panel: Recent Labs  Lab 06/19/21 2225 06/20/21 0456 06/22/21 0024 06/23/21 0040 06/24/21 0013 06/25/21 0114 06/26/21 0032  NA  --    < > 131* 129* 131* 132* 131*  K  --    < > 3.5 3.3* 3.4* 3.3* 4.4  CL  --    < > 105 102 102 103 103  CO2  --    < > 20* 21* 20* 21* 21*  GLUCOSE  --    < >  94 103* 109* 100* 87  BUN  --    < > 12 10 14 15 14   CREATININE  --    < > 0.66 0.61 0.75 0.86 0.74  CALCIUM  --    < > 7.2* 7.2* 7.3* 7.6* 7.6*  MG 2.2   < > 1.4* 1.3* 1.9 1.4* 1.2*  PHOS 2.6  --   --   --   --   --   --    < > = values in this interval not displayed.   GFR: Estimated Creatinine Clearance: 93.3 mL/min (by C-G formula based on SCr of 0.74 mg/dL). Liver Function Tests: Recent Labs  Lab 06/20/21 0456 06/21/21 0601 06/22/21 0024 06/23/21 0040  AST 115* 124* 92* 86*  ALT 39 44 38 37  ALKPHOS 101 103 108 124  BILITOT 8.2* 9.6* 7.9* 8.1*  PROT 7.2 7.5 6.6 6.8  ALBUMIN 1.7* 1.6* 1.4* 1.4*   No results  for input(s): LIPASE, AMYLASE in the last 168 hours. Recent Labs  Lab 06/21/21 0601  AMMONIA 31   Coagulation Profile: No results for input(s): INR, PROTIME in the last 168 hours.  Cardiac Enzymes: No results for input(s): CKTOTAL, CKMB, CKMBINDEX, TROPONINI in the last 168 hours. BNP (last 3 results) No results for input(s): PROBNP in the last 8760 hours. HbA1C: No results for input(s): HGBA1C in the last 72 hours. CBG: Recent Labs  Lab 06/20/21 1642 06/20/21 1952 06/20/21 2350 06/21/21 0442 06/21/21 0721  GLUCAP 116* 106* 103* 90 96   Lipid Profile: No results for input(s): CHOL, HDL, LDLCALC, TRIG, CHOLHDL, LDLDIRECT in the last 72 hours. Thyroid Function Tests: No results for input(s): TSH, T4TOTAL, FREET4, T3FREE, THYROIDAB in the last 72 hours.  Anemia Panel: No results for input(s): VITAMINB12, FOLATE, FERRITIN, TIBC, IRON, RETICCTPCT in the last 72 hours. Urine analysis:    Component Value Date/Time   COLORURINE AMBER (A) 06/17/2021 1011   APPEARANCEUR HAZY (A) 06/17/2021 1011   LABSPEC 1.020 06/17/2021 1011   PHURINE 5.0 06/17/2021 1011   GLUCOSEU NEGATIVE 06/17/2021 1011   HGBUR NEGATIVE 06/17/2021 1011   BILIRUBINUR LARGE (A) 06/17/2021 1011   KETONESUR 15 (A) 06/17/2021 1011   PROTEINUR 30 (A) 06/17/2021 1011   NITRITE NEGATIVE  06/17/2021 1011   LEUKOCYTESUR MODERATE (A) 06/17/2021 1011   Recent Results (from the past 240 hour(s))  Urine Culture     Status: None   Collection Time: 06/17/21 10:11 AM   Specimen: In/Out Cath Urine  Result Value Ref Range Status   Specimen Description IN/OUT CATH URINE  Final   Special Requests NONE  Final   Culture   Final    NO GROWTH Performed at [facility], [address] [place], [place] [address]    Report Status 06/19/2021 FINAL  Final  Blood Culture (routine x 2)     Status: None   Collection Time: 06/17/21 10:15 AM   Specimen: BLOOD LEFT FOREARM  Result Value Ref Range Status   Specimen Description BLOOD LEFT FOREARM  Final   Special Requests   Final    BOTTLES DRAWN AEROBIC AND ANAEROBIC Blood Culture adequate volume   Culture   Final    NO GROWTH 5 DAYS Performed at [facility], [address] [place], [place] [address]    Report Status 06/22/2021 FINAL  Final  Blood Culture (routine x 2)     Status: None   Collection Time: 06/17/21 10:17 AM   Specimen: BLOOD  Result Value Ref Range Status   Specimen Description BLOOD SITE NOT SPECIFIED  Final   Special Requests   Final    BOTTLES DRAWN AEROBIC AND ANAEROBIC Blood Culture results may not be optimal due to an excessive volume of blood received in culture bottles   Culture   Final    NO GROWTH 5 DAYS Performed at [facility], [address] [place], [place] [address]    Report Status 06/22/2021 FINAL  Final  Resp Panel by RT-PCR (Flu A&B, Covid)     Status: None   Collection Time: 06/17/21  2:52 PM  Result Value Ref Range Status   SARS Coronavirus 2 by RT PCR NEGATIVE NEGATIVE Final    Comment: (NOTE) SARS-CoV-2 target nucleic acids are NOT DETECTED.  The SARS-CoV-2 RNA is generally detectable in upper respiratory specimens during the acute phase of infection. The lowest concentration of SARS-CoV-2 viral copies this assay can detect is 138 copies/mL.  A negative result does not  preclude SARS-Cov-2 infection and should not be used as the sole basis for treatment or other patient management decisions. A negative result may occur with  improper specimen collection/handling, submission of specimen other than nasopharyngeal swab, presence of viral mutation(s) within the areas targeted by this assay, and inadequate number of viral copies(<138 copies/mL). A negative result must be combined with clinical observations, patient history, and epidemiological information. The expected result is Negative.  Fact Sheet for Patients:  BloggerCourse.com  Fact Sheet for Healthcare Providers:  SeriousBroker.it  This test is no t yet approved or cleared by the [place] FDA and  has been authorized for detection and/or diagnosis of SARS-CoV-2 by FDA under an Emergency Use Authorization (EUA). This EUA will remain  in effect (meaning this test can be used) for the duration of the COVID-19 declaration under Section 564(b)(1) of the Act, 21 U.S.C.section 360bbb-3(b)(1), unless the authorization is terminated  or revoked sooner.       Influenza A by PCR NEGATIVE NEGATIVE Final   Influenza B by PCR NEGATIVE NEGATIVE Final    Comment: (NOTE) The Xpert Xpress SARS-CoV-2/FLU/RSV plus assay is intended as an aid in the diagnosis of influenza from Nasopharyngeal swab specimens and should not be used as a sole basis for treatment. Nasal washings and aspirates are unacceptable for Xpert Xpress SARS-CoV-2/FLU/RSV testing.  Fact Sheet for Patients: BloggerCourse.com  Fact Sheet for Healthcare Providers: SeriousBroker.it  This test is not yet approved or cleared by the [place] FDA and has been authorized for detection and/or diagnosis of SARS-CoV-2 by FDA under an Emergency Use Authorization (EUA). This EUA will remain in effect (meaning this test can be used) for the  duration of the COVID-19 declaration under Section 564(b)(1) of the Act, 21 U.S.C. section 360bbb-3(b)(1), unless the authorization is terminated or revoked.  Performed at [facility], [address], [place], [place] [address]   Culture, Respiratory w Gram Stain     Status: None   Collection Time: 06/17/21 11:17 PM   Specimen: Tracheal Aspirate; Respiratory  Result Value Ref Range Status   Specimen Description TRACHEAL ASPIRATE  Final   Special Requests NONE  Final   Gram Stain   Final    NO SQUAMOUS EPITHELIAL CELLS SEEN FEW WBC PRESENT,BOTH PMN AND MONONUCLEAR NO ORGANISMS SEEN    Culture   Final    RARE Normal respiratory flora-no Staph aureus or Pseudomonas seen Performed at [facility], [address] [place], [place] [address]    Report Status 06/20/2021 FINAL  Final  MRSA Next Gen by PCR, Nasal     Status: Abnormal   Collection Time: 06/18/21  1:16 AM   Specimen: Nasal Mucosa; Nasal Swab  Result Value Ref Range Status   MRSA by PCR Next Gen DETECTED (A) NOT DETECTED Final    Comment: RESULT CALLED TO, READ BACK BY AND VERIFIED WITH: GERMILLION,H RN 06/18/2021 AT 0218 SKEEN,P (NOTE) The GeneXpert MRSA Assay (FDA approved for NASAL specimens only), is one component of a comprehensive MRSA colonization surveillance program. It is not intended to diagnose MRSA infection nor to guide or monitor treatment for MRSA infections. Test performance is not FDA approved in patients less than 46 years old. Performed at [facility], [address] [place], [place] [address]   Peritoneal fluid culture w Gram Stain     Status: None   Collection Time: 06/19/21  5:48 PM   Specimen: Peritoneal Fluid  Result Value Ref Range Status   Specimen Description FLUID PERITONEAL  Final   Special Requests NONE  Final   Gram Stain NO WBC SEEN NO ORGANISMS SEEN   Final   Culture   Final    NO GROWTH 3 DAYS Performed at [facility], [address],  [place], [place] [address]    Report Status 06/23/2021 FINAL  Final      Radiology Studies: No results found.  Scheduled Meds:  sodium chloride   Intravenous Once   folic acid  1 mg Oral Daily   Gerhardt's butt cream   Topical TID   hydrocortisone   Rectal Once   lactulose  10 g Oral BID   magnesium oxide  800 mg Oral BID   mouth rinse  15 mL Mouth Rinse BID   multivitamin with minerals  1 tablet Oral Daily   potassium chloride  40 mEq Oral Daily   rifaximin  550 mg Oral BID   spironolactone  100 mg Oral Daily   thiamine  100 mg Oral Daily   Or   thiamine  100 mg Intravenous Daily   Continuous Infusions:  sodium chloride 10 mL/hr at 06/19/21 1731   furosemide 120 mg (06/26/21 1158)     LOS: 9 days   Time spent: 35 minutes.  Tyrone Nine, MD [facility] [email or web address] 06/26/2021, 1:40 PM

## 2021-06-26 NOTE — Progress Notes (Signed)
Occupational Therapy Treatment Patient Details Name: Michelle Little MRN: [id number] DOB: 16-Oct-1986 Today's Date: 06/26/2021    History of present illness 35 y.o. female presenting to ED 7/25 with decreased responsiveness, poor p.o. intake, jaundice and BLE swelling. Patient admitted with acute hepatic encephalopathy/hyperammonemia, decompensated liver failure, suspected seizure, acute respiratory failure requiring mechanical ventilation secondary pulmonary edema +/- aspiration pneumonia s/p extubation 7/25, acute on chronic blood loss secondary to anemia septic shock and ETOH withdrawl. PMHx significant for ETOH use disorder and IDA.   OT comments  Pt progressing well towards OT goals.  Remains limited by edema and discomfort, impaired balance.  Completing LB dressing with min assist, requires assist for L LE clothing mgmt.  Toilet transfers with min guard, functional mobility with min guard using RW and grooming at sink with min guard.  Mild unsteadiness without UE support.  Continue to recommend HH at dc.  Will follow.    Follow Up Recommendations  Home health OT;Supervision/Assistance - 24 hour    Equipment Recommendations  3 in 1 bedside commode    Recommendations for Other Services      Precautions / Restrictions Precautions Precautions: Fall Restrictions Weight Bearing Restrictions: No       Mobility Bed Mobility               General bed mobility comments: OOB on BSC upon entry    Transfers Overall transfer level: Needs assistance Equipment used: Rolling walker (2 wheeled) Transfers: Sit to/from Stand Sit to Stand: Min guard         General transfer comment: min guard for safety and balance    Balance Overall balance assessment: Needs assistance Sitting-balance support: No upper extremity supported;Feet supported Sitting balance-Leahy Scale: Fair     Standing balance support: Bilateral upper extremity supported;During functional activity Standing  balance-Leahy Scale: Fair Standing balance comment: min guard during ADLs, relies on BUE support dynamically                           ADL either performed or assessed with clinical judgement   ADL Overall ADL's : Needs assistance/impaired     Grooming: Wash/dry hands;Min guard;Standing           Upper Body Dressing : Minimal assistance;Sitting Upper Body Dressing Details (indicate cue type and reason): sweatshirt Lower Body Dressing: Minimal assistance;Sit to/from stand Lower Body Dressing Details (indicate cue type and reason): requires assist for L LE threading underwear and donning sock; able to complete R LE, min guard in standing Toilet Transfer: Min guard;Ambulation;RW           Functional mobility during ADLs: Min guard;Rolling walker       Vision       Perception     Praxis      Cognition Arousal/Alertness: Awake/alert Behavior During Therapy: WFL for tasks assessed/performed Overall Cognitive Status: Within Functional Limits for tasks assessed                                          Exercises     Shoulder Instructions       General Comments VSS on RA    Pertinent Vitals/ Pain       Pain Assessment: No/denies pain  Home Living  Prior Functioning/Environment              Frequency  Min 2X/week        Progress Toward Goals  OT Goals(current goals can now be found in the care plan section)  Progress towards OT goals: Progressing toward goals  Acute Rehab OT Goals Patient Stated Goal: be able to walk out of here OT Goal Formulation: With patient  Plan Discharge plan remains appropriate;Frequency remains appropriate    Co-evaluation                 AM-PAC OT "6 Clicks" Daily Activity     Outcome Measure   Help from another person eating meals?: A Little Help from another person taking care of personal grooming?: A Little Help from  another person toileting, which includes using toliet, bedpan, or urinal?: A Little Help from another person bathing (including washing, rinsing, drying)?: A Little Help from another person to put on and taking off regular upper body clothing?: A Little Help from another person to put on and taking off regular lower body clothing?: A Little 6 Click Score: 18    End of Session Equipment Utilized During Treatment: Rolling walker  OT Visit Diagnosis: Unsteadiness on feet (R26.81);Other abnormalities of gait and mobility (R26.89);Muscle weakness (generalized) (M62.81)   Activity Tolerance Patient tolerated treatment well   Patient Left Other (comment) (with PT)   Nurse Communication Mobility status        Time: 3903-0092 OT Time Calculation (min): 27 min  Charges: OT General Charges $OT Visit: 1 Visit OT Treatments $Self Care/Home Management : 23-37 mins  Barry Brunner, OT Acute Rehabilitation Services Pager [phone] Office [phone]    Chancy Milroy 06/26/2021, 10:18 AM

## 2021-06-27 LAB — CBC
HCT: 22.8 % — ABNORMAL LOW (ref 36.0–46.0)
Hemoglobin: 7.4 g/dL — ABNORMAL LOW (ref 12.0–15.0)
MCH: 31.6 pg (ref 26.0–34.0)
MCHC: 32.5 g/dL (ref 30.0–36.0)
MCV: 97.4 fL (ref 80.0–100.0)
Platelets: 87 10*3/uL — ABNORMAL LOW (ref 150–400)
RBC: 2.34 MIL/uL — ABNORMAL LOW (ref 3.87–5.11)
RDW: 26.7 % — ABNORMAL HIGH (ref 11.5–15.5)
WBC: 15.8 10*3/uL — ABNORMAL HIGH (ref 4.0–10.5)
nRBC: 0.1 % (ref 0.0–0.2)

## 2021-06-27 LAB — BASIC METABOLIC PANEL
Anion gap: 7 (ref 5–15)
BUN: 13 mg/dL (ref 6–20)
CO2: 20 mmol/L — ABNORMAL LOW (ref 22–32)
Calcium: 7.6 mg/dL — ABNORMAL LOW (ref 8.9–10.3)
Chloride: 101 mmol/L (ref 98–111)
Creatinine, Ser: 0.72 mg/dL (ref 0.44–1.00)
GFR, Estimated: >60 mL/min (ref >60)
Glucose, Bld: 84 mg/dL (ref 70–99)
Potassium: 4.5 mmol/L (ref 3.5–5.1)
Sodium: 128 mmol/L — ABNORMAL LOW (ref 135–145)

## 2021-06-27 LAB — MAGNESIUM: Magnesium: 1.4 mg/dL — ABNORMAL LOW (ref 1.7–2.4)

## 2021-06-27 MED ORDER — MAGNESIUM SULFATE 4 GM/100ML IV SOLN
4.0000 g | Freq: Once | INTRAVENOUS | Status: AC
  Administered 2021-06-27: 4 g via INTRAVENOUS
  Filled 2021-06-27: qty 100

## 2021-06-27 NOTE — Plan of Care (Signed)
  Problem: Health Behavior/Discharge Planning: Goal: Ability to manage health-related needs will improve Outcome: Not Progressing   Problem: Clinical Measurements: Goal: Ability to maintain clinical measurements within normal limits will improve Outcome: Not Progressing Goal: Diagnostic test results will improve Outcome: Not Progressing   Problem: Activity: Goal: Risk for activity intolerance will decrease Outcome: Not Progressing Note: Pt insistent on using Purewick instead of using BSC.

## 2021-06-27 NOTE — Progress Notes (Signed)
PROGRESS NOTE  Michelle Little  ONG:[id number] DOB: [birth year] DOA: 06/17/2021 PCP: Pcp, No   Brief Narrative: Michelle Little is a 35 y.o. female with a history of EtOH cirrhosis, tobacco use who presented from [facility] due to somnolence, unresponsiveness on 7/25 requiring intubation on arrival, subsequently extubated 7/26. Initial hemoglobin noted to be 1.8 for which 5u PRBCs were given and GI consulted, though FOBT was negative. Treatment with lactulose and rifaximin for hepatic encephalopathy complicated by diarrhea has improved mentation significantly. Paracentesis 7/27 showed no SBP, transudate consistent with portal HTN. Doxycycline has been given for pneumonia and lasix has been given for persistent volume overload. Spironolactone added.   Assessment & Plan: Active Problems:   Acute encephalopathy   Anemia   Hepatic encephalopathy (HCC)   Acute renal failure (HCC)   Other ascites   Cirrhosis (HCC)  Acute hepatic encephalopathy: Improving. CT head nonacute.  - Continue rifaximin.  - Continue lactulose once daily with continued mental clarity.   - Delirium precautions to continue  Decompensated alcoholic cirrhosis with ascites, hypoalbuminemia, thrombocytopenia, coagulopathy: MELD-Na score is 27. Hepatitis panel negative.  - s/p paracentesis 7/27 by PCCM negative for SBP. Ascitic fluid albumin <1. Could probably repeat prior to discharge given recurrent abdominal distention, though pt is taking po adequately and not overly concerned at this time.  - Continue lasix 120mg  IV BID, BUN and Cr stable. Improved documentation of UOP, 2.3L yesterday, will add fluid restriction. Weight down 77kg >>68kg thus far.  - Monitor weights, I/O.  - Continue spironolactone 100mg  (added 8/1)  Symptomatic macrocytic anemia: FOBT negative. Ferritin 52, iron 41, TIBC low 193. B12 1,496.  - Hgb improved with additional 1u PRBCs 7/31. Also s/p 5u at admission. Will keep monitoring and  transfuse prn. - No endoscopic evaluation currently planned.    Folic acid deficiency:  - Continue supplementation.   CAP: Completed 5 days abx  Leukocytosis: Persistent, unclear chronicity.  - Pt remains without focal infectious nidus and afebrile, so will not escalate antimicrobials at this time.   Lactic acidosis: Resolved, level was 10. Suspect impaired hepatic clearance in addition to infection as etiology.   Hypokalemia:  - K again replete today. Suspect spironolactone helping with this. Will hold supplement today and monitor in AM.    Hypomagnesemia:  - Severe and continues to be refractory. Supplement by IV and po and monitor.   AKI: Resolved with fluid + albumin (pre-renal FENa), now with evidence of overload so attempting diuresis as above.  - Creatinine stable, BUN widening a bit, will continue IV diuresis as above and regular metabolic panel monitoring.   Asymptomatic bacteriuria: Negative urine culture.   Alcohol abuse: Given ativan for possible withdrawal earlier in hospitalization which caused AMS. No evidence of withdrawal at this time.  - Avoid ativan, etc.  - Cessation counseling  No glycemic excursions, stop checking CBGs.   DVT prophylaxis: SCDs Code Status: Full Family Communication: None at bedside Disposition Plan:  Status is: Inpatient  Remains inpatient appropriate because:Inpatient level of care appropriate due to severity of illness  Dispo: The patient is from: Home              Anticipated d/c is to: Home - PT/OT updated recommendations based on improved mobility due in large part to improved volume status. Will return to [facility] at [facility]. Will need Rx's sent [facility]. [facility] arranged.               Patient currently is not  medically stable to d/c.   Difficult to place patient No  Consultants:  GI PCCM  Procedures:  Paracentesis 7/27  Antimicrobials: Doxycycline 7/27 - 7/29 Vancomycin, cefepime, flagyl 7/25 Ceftriaxone,  azithromycin 7/25 - 7/26 Rifaximin 7/28 >>  Subjective: Persistent frequent high volume urination is stable. No dysuria. Stools still loose but improved frequency. Mentation is clear. Swelling in legs is slightly better per her report but remains grossly edematous compared to baseline.  Objective: Vitals:   06/26/21 2016 06/26/21 2302 06/27/21 0443 06/27/21 0725  BP: 101/74 96/73 (!) 91/58 102/65  Pulse: 94 86 95 89  Resp: 19 20 18 19   Temp: 98.4 F (36.9 C) 98.3 F (36.8 C) 98.6 F (37 C) 98.1 F (36.7 C)  TempSrc: Oral Oral Oral Oral  SpO2: 98% 100% 97% 96%  Weight:   68.6 kg   Height:        Intake/Output Summary (Last 24 hours) at 06/27/2021 1015 Last data filed at 06/27/2021 0442 Gross per 24 hour  Intake 300 ml  Output 2050 ml  Net -1750 ml   Filed Weights   06/25/21 0430 06/26/21 0935 06/27/21 0443  Weight: 70 kg 71.4 kg 68.6 kg   Gen: 35 y.o. female in no distress Pulm: Nonlabored breathing room air. Clear. CV: Regular rate and rhythm. No murmur, rub, or gallop. No JVD, stable, modestly improved pitting LE edema GI: Abdomen soft, +fluid weave, non-tender, with normoactive bowel sounds.  Ext: Warm, no deformities Skin: No new rashes, lesions or ulcers on visualized skin. +icteric sclerae. Neuro: Alert and oriented. No focal neurological deficits. Psych: Judgement and insight appear fair. Mood euthymic & affect congruent. Behavior is appropriate.    Data Reviewed: I have personally reviewed following labs and imaging studies  CBC: Recent Labs  Lab 06/22/21 0024 06/23/21 0040 06/23/21 1915 06/24/21 0013 06/25/21 0114 06/27/21 0615  WBC 16.5* 17.1*  --  16.3* 15.5* 15.8*  HGB 7.4* 7.3* 8.3* 8.5* 8.2* 7.4*  HCT 22.2* 22.5* 25.3* 25.6* 24.7* 22.8*  MCV 96.9 97.8  --  95.5 96.5 97.4  PLT 75* 81*  --  78* 80* 87*   Basic Metabolic Panel: Recent Labs  Lab 06/23/21 0040 06/24/21 0013 06/25/21 0114 06/26/21 0032 06/27/21 0615  NA 129* 131* 132* 131* 128*   K 3.3* 3.4* 3.3* 4.4 4.5  CL 102 102 103 103 101  CO2 21* 20* 21* 21* 20*  GLUCOSE 103* 109* 100* 87 84  BUN 10 14 15 14 13   CREATININE 0.61 0.75 0.86 0.74 0.72  CALCIUM 7.2* 7.3* 7.6* 7.6* 7.6*  MG 1.3* 1.9 1.4* 1.2* 1.4*   GFR: Estimated Creatinine Clearance: 91.7 mL/min (by C-G formula based on SCr of 0.72 mg/dL). Liver Function Tests: Recent Labs  Lab 06/21/21 0601 06/22/21 0024 06/23/21 0040  AST 124* 92* 86*  ALT 44 38 37  ALKPHOS 103 108 124  BILITOT 9.6* 7.9* 8.1*  PROT 7.5 6.6 6.8  ALBUMIN 1.6* 1.4* 1.4*   No results for input(s): LIPASE, AMYLASE in the last 168 hours. Recent Labs  Lab 06/21/21 0601  AMMONIA 31   Coagulation Profile: No results for input(s): INR, PROTIME in the last 168 hours.  Cardiac Enzymes: No results for input(s): CKTOTAL, CKMB, CKMBINDEX, TROPONINI in the last 168 hours. BNP (last 3 results) No results for input(s): PROBNP in the last 8760 hours. HbA1C: No results for input(s): HGBA1C in the last 72 hours. CBG: Recent Labs  Lab 06/20/21 1642 06/20/21 1952 06/20/21 2350 06/21/21 06/22/21 06/21/21 6546  GLUCAP 116* 106* 103* 90 96   Lipid Profile: No results for input(s): CHOL, HDL, LDLCALC, TRIG, CHOLHDL, LDLDIRECT in the last 72 hours. Thyroid Function Tests: No results for input(s): TSH, T4TOTAL, FREET4, T3FREE, THYROIDAB in the last 72 hours.  Anemia Panel: No results for input(s): VITAMINB12, FOLATE, FERRITIN, TIBC, IRON, RETICCTPCT in the last 72 hours. Urine analysis:    Component Value Date/Time   COLORURINE AMBER (A) 06/17/2021 1011   APPEARANCEUR HAZY (A) 06/17/2021 1011   LABSPEC 1.020 06/17/2021 1011   PHURINE 5.0 06/17/2021 1011   GLUCOSEU NEGATIVE 06/17/2021 1011   HGBUR NEGATIVE 06/17/2021 1011   BILIRUBINUR LARGE (A) 06/17/2021 1011   KETONESUR 15 (A) 06/17/2021 1011   PROTEINUR 30 (A) 06/17/2021 1011   NITRITE NEGATIVE 06/17/2021 1011   LEUKOCYTESUR MODERATE (A) 06/17/2021 1011   Recent Results (from  the past 240 hour(s))  Blood Culture (routine x 2)     Status: None   Collection Time: 06/17/21 10:17 AM   Specimen: BLOOD  Result Value Ref Range Status   Specimen Description BLOOD SITE NOT SPECIFIED  Final   Special Requests   Final    BOTTLES DRAWN AEROBIC AND ANAEROBIC Blood Culture results may not be optimal due to an excessive volume of blood received in culture bottles   Culture   Final    NO GROWTH 5 DAYS Performed at [facility], [address] [place], [place] [address]    Report Status 06/22/2021 FINAL  Final  Resp Panel by RT-PCR (Flu A&B, Covid)     Status: None   Collection Time: 06/17/21  2:52 PM  Result Value Ref Range Status   SARS Coronavirus 2 by RT PCR NEGATIVE NEGATIVE Final    Comment: (NOTE) SARS-CoV-2 target nucleic acids are NOT DETECTED.  The SARS-CoV-2 RNA is generally detectable in upper respiratory specimens during the acute phase of infection. The lowest concentration of SARS-CoV-2 viral copies this assay can detect is 138 copies/mL. A negative result does not preclude SARS-Cov-2 infection and should not be used as the sole basis for treatment or other patient management decisions. A negative result may occur with  improper specimen collection/handling, submission of specimen other than nasopharyngeal swab, presence of viral mutation(s) within the areas targeted by this assay, and inadequate number of viral copies(<138 copies/mL). A negative result must be combined with clinical observations, patient history, and epidemiological information. The expected result is Negative.  Fact Sheet for Patients:  BloggerCourse.comhttps://www.fda.gov/media/152166/download  Fact Sheet for Healthcare Providers:  SeriousBroker.ithttps://www.fda.gov/media/152162/download  This test is no t yet approved or cleared by the [place] FDA and  has been authorized for detection and/or diagnosis of SARS-CoV-2 by FDA under an Emergency Use Authorization (EUA). This EUA will remain  in  effect (meaning this test can be used) for the duration of the COVID-19 declaration under Section 564(b)(1) of the Act, 21 U.S.C.section 360bbb-3(b)(1), unless the authorization is terminated  or revoked sooner.       Influenza A by PCR NEGATIVE NEGATIVE Final   Influenza B by PCR NEGATIVE NEGATIVE Final    Comment: (NOTE) The Xpert Xpress SARS-CoV-2/FLU/RSV plus assay is intended as an aid in the diagnosis of influenza from Nasopharyngeal swab specimens and should not be used as a sole basis for treatment. Nasal washings and aspirates are unacceptable for Xpert Xpress SARS-CoV-2/FLU/RSV testing.  Fact Sheet for Patients: BloggerCourse.comhttps://www.fda.gov/media/152166/download  Fact Sheet for Healthcare Providers: SeriousBroker.ithttps://www.fda.gov/media/152162/download  This test is not yet approved or cleared by the [place]  FDA and has been authorized for detection and/or diagnosis of SARS-CoV-2 by FDA under an Emergency Use Authorization (EUA). This EUA will remain in effect (meaning this test can be used) for the duration of the COVID-19 declaration under Section 564(b)(1) of the Act, 21 U.S.C. section 360bbb-3(b)(1), unless the authorization is terminated or revoked.  Performed at [facility], [address], [place], [place] [address]   Culture, Respiratory w Gram Stain     Status: None   Collection Time: 06/17/21 11:17 PM   Specimen: Tracheal Aspirate; Respiratory  Result Value Ref Range Status   Specimen Description TRACHEAL ASPIRATE  Final   Special Requests NONE  Final   Gram Stain   Final    NO SQUAMOUS EPITHELIAL CELLS SEEN FEW WBC PRESENT,BOTH PMN AND MONONUCLEAR NO ORGANISMS SEEN    Culture   Final    RARE Normal respiratory flora-no Staph aureus or Pseudomonas seen Performed at [facility], [address], [place], [place] [address]    Report Status 06/20/2021 FINAL  Final  MRSA Next Gen by PCR, Nasal     Status: Abnormal   Collection Time: 06/18/21  1:16 AM    Specimen: Nasal Mucosa; Nasal Swab  Result Value Ref Range Status   MRSA by PCR Next Gen DETECTED (A) NOT DETECTED Final    Comment: RESULT CALLED TO, READ BACK BY AND VERIFIED WITH: GERMILLION,H RN 06/18/2021 AT 0218 SKEEN,P (NOTE) The GeneXpert MRSA Assay (FDA approved for NASAL specimens only), is one component of a comprehensive MRSA colonization surveillance program. It is not intended to diagnose MRSA infection nor to guide or monitor treatment for MRSA infections. Test performance is not FDA approved in patients less than 10 years old. Performed at [facility], [address], [place], [place] [address]   Peritoneal fluid culture w Gram Stain     Status: None   Collection Time: 06/19/21  5:48 PM   Specimen: Peritoneal Fluid  Result Value Ref Range Status   Specimen Description FLUID PERITONEAL  Final   Special Requests NONE  Final   Gram Stain NO WBC SEEN NO ORGANISMS SEEN   Final   Culture   Final    NO GROWTH 3 DAYS Performed at [facility], [address] [place], [place] [address]    Report Status 06/23/2021 FINAL  Final      Radiology Studies: No results found.  Scheduled Meds:  folic acid  1 mg Oral Daily   Gerhardt's butt cream   Topical TID   hydrocortisone   Rectal Once   lactulose  10 g Oral Daily   magnesium oxide  800 mg Oral BID   mouth rinse  15 mL Mouth Rinse BID   multivitamin with minerals  1 tablet Oral Daily   rifaximin  550 mg Oral BID   spironolactone  100 mg Oral Daily   thiamine  100 mg Oral Daily   Or   thiamine  100 mg Intravenous Daily   Continuous Infusions:  sodium chloride 10 mL/hr at 06/19/21 1731   furosemide 120 mg (06/26/21 1842)   magnesium sulfate bolus IVPB       LOS: 10 days   Time spent: 35 minutes.  Tyrone Nine, MD [facility] [email or web address] 06/27/2021, 10:15 AM

## 2021-06-28 LAB — MAGNESIUM: Magnesium: 1.6 mg/dL — ABNORMAL LOW (ref 1.7–2.4)

## 2021-06-28 LAB — BASIC METABOLIC PANEL
Anion gap: 7 (ref 5–15)
BUN: 13 mg/dL (ref 6–20)
CO2: 23 mmol/L (ref 22–32)
Calcium: 7.7 mg/dL — ABNORMAL LOW (ref 8.9–10.3)
Chloride: 98 mmol/L (ref 98–111)
Creatinine, Ser: 0.85 mg/dL (ref 0.44–1.00)
GFR, Estimated: >60 mL/min (ref >60)
Glucose, Bld: 105 mg/dL — ABNORMAL HIGH (ref 70–99)
Potassium: 4.1 mmol/L (ref 3.5–5.1)
Sodium: 128 mmol/L — ABNORMAL LOW (ref 135–145)

## 2021-06-28 MED ORDER — MAGNESIUM SULFATE 4 GM/100ML IV SOLN
4.0000 g | Freq: Once | INTRAVENOUS | Status: AC
  Administered 2021-06-28: 4 g via INTRAVENOUS
  Filled 2021-06-28: qty 100

## 2021-06-28 NOTE — Progress Notes (Signed)
Physical Therapy Treatment Patient Details Name: Michelle Little MRN: [id number] DOB: 07-29-1986 Today's Date: 06/28/2021    History of Present Illness 35 y.o. female presenting to ED 7/25 with decreased responsiveness, poor p.o. intake, jaundice and BLE swelling. Patient admitted with acute hepatic encephalopathy/hyperammonemia, decompensated liver failure, suspected seizure, acute respiratory failure requiring mechanical ventilation secondary pulmonary edema +/- aspiration pneumonia s/p extubation 7/25, acute on chronic blood loss secondary to anemia septic shock and ETOH withdrawl. PMHx significant for ETOH use disorder and IDA.    PT Comments    Pt seated in recliner but receptive to move this afternoon.  Reports feet feel heavy but denies pain.  Pt continues to benefit from HHPT.  Plan for LE exercises next session to improve activity tolerance and increase strength.      Follow Up Recommendations  Home health PT     Equipment Recommendations  Rolling walker with 5" wheels    Recommendations for Other Services       Precautions / Restrictions Precautions Precautions: Fall Precaution Comments: watch HR Restrictions Weight Bearing Restrictions: No    Mobility  Bed Mobility               General bed mobility comments: Pt received sitting EOB.    Transfers Overall transfer level: Needs assistance Equipment used: Rolling walker (2 wheeled) Transfers: Sit to/from Stand Sit to Stand: Supervision         General transfer comment: Cues for hand placement to and from seated surface.  Ambulation/Gait Ambulation/Gait assistance: Min guard;Supervision Gait Distance (Feet): 120 Feet Assistive device: Rolling walker (2 wheeled) Gait Pattern/deviations: Step-through pattern Gait velocity: decreased   General Gait Details: slow, steady gait with RW, HR 112 bpm to 121 bpm.   Stairs             Wheelchair Mobility    Modified Rankin (Stroke Patients Only)        Balance Overall balance assessment: Needs assistance Sitting-balance support: No upper extremity supported;Feet supported Sitting balance-Leahy Scale: Fair     Standing balance support: Bilateral upper extremity supported;During functional activity;No upper extremity supported Standing balance-Leahy Scale: Fair Standing balance comment: reliant on RW for amb, static stand without UE support                            Cognition Arousal/Alertness: Awake/alert Behavior During Therapy: WFL for tasks assessed/performed Overall Cognitive Status: Within Functional Limits for tasks assessed                                        Exercises      General Comments        Pertinent Vitals/Pain Pain Assessment: No/denies pain    Home Living                      Prior Function            PT Goals (current goals can now be found in the care plan section) Acute Rehab PT Goals Patient Stated Goal: be able to walk out of here Potential to Achieve Goals: Good Progress towards PT goals: Progressing toward goals    Frequency    Min 3X/week      PT Plan Current plan remains appropriate    Co-evaluation  AM-PAC PT "6 Clicks" Mobility   Outcome Measure  Help needed turning from your back to your side while in a flat bed without using bedrails?: A Little Help needed moving from lying on your back to sitting on the side of a flat bed without using bedrails?: A Little Help needed moving to and from a bed to a chair (including a wheelchair)?: A Little Help needed standing up from a chair using your arms (e.g., wheelchair or bedside chair)?: A Little Help needed to walk in hospital room?: A Little Help needed climbing 3-5 steps with a railing? : A Little 6 Click Score: 18    End of Session Equipment Utilized During Treatment: Gait belt Activity Tolerance: Patient tolerated treatment well Patient left: with call  bell/phone within reach;in bed Nurse Communication: Mobility status PT Visit Diagnosis: Other abnormalities of gait and mobility (R26.89);Muscle weakness (generalized) (M62.81)     Time: 6045-4098 PT Time Calculation (min) (ACUTE ONLY): 18 min  Charges:  $Gait Training: 8-22 mins                     Bonney Leitz , PTA Acute Rehabilitation Services Pager [phone] Office [phone]    Aly Seidenberg Artis Delay 06/28/2021, 1:48 PM

## 2021-06-28 NOTE — Progress Notes (Signed)
PROGRESS NOTE  Michelle Little  UJW:[id number] DOB: 07/31/1986 DOA: 06/17/2021 PCP: Pcp, No   Brief Narrative: Michelle Little is a 35 y.o. female with a history of EtOH cirrhosis, tobacco use who presented from [facility] due to somnolence, unresponsiveness on 7/25 requiring intubation on arrival, subsequently extubated 7/26. Initial hemoglobin noted to be 1.8 for which 5u PRBCs were given and GI consulted, though FOBT was negative. Treatment with lactulose and rifaximin for hepatic encephalopathy complicated by diarrhea has improved mentation significantly. Paracentesis 7/27 showed no SBP, transudate consistent with portal HTN. Doxycycline has been given for pneumonia and lasix has been given for persistent volume overload. Spironolactone added. Lasix dosing has been escalated with continued improvement in volume status.   Assessment & Plan: Active Problems:   Acute encephalopathy   Anemia   Hepatic encephalopathy (HCC)   Acute renal failure (HCC)   Other ascites   Cirrhosis (HCC)  Acute hepatic encephalopathy: This has resolved. CT head nonacute.  - Continue rifaximin.  - will hold lactulose with continued very loose stools and mental clarity.  - Delirium precautions to continue  Decompensated alcoholic cirrhosis with ascites, hypoalbuminemia, thrombocytopenia, coagulopathy: MELD-Na score is 27. Hepatitis panel negative.  - s/p paracentesis 7/27 by PCCM negative for SBP. Ascitic fluid albumin <1. Could probably repeat prior to discharge given recurrent abdominal distention, though pt is taking po adequately and not overly concerned at this time.  - Continue lasix 120mg  IV BID. This has been an effective dose with vigorous diuresis, stable renal function. Down another 3.2L /24 hrs weight down 77kg >>66kg thus far.  - Continue fluid restriction - Monitor weights, I/O.  - Continue spironolactone 100mg  (added 8/1)  Symptomatic macrocytic anemia: FOBT negative. Ferritin 52,  iron 41, TIBC low 193. B12 1,496.  - Hgb improved with additional 1u PRBCs 7/31. Also s/p 5u at admission. Will keep monitoring and transfuse prn. - No endoscopic evaluation currently planned.    Folic acid deficiency:  - Continue supplementation.   CAP: Completed 5 days abx  Leukocytosis: Persistent, unclear chronicity.  - Pt remains without focal infectious nidus and afebrile, so will not escalate antimicrobials at this time. Recheck in AM.   Lactic acidosis: Resolved, level was 10. Suspect impaired hepatic clearance in addition to infection as etiology.   Hypokalemia:  - K again replete today. Suspect spironolactone helping with this. No supplement needed  Hypomagnesemia:  - Improving with aggressive supplementation which will be repeated today.   AKI: Resolved with fluid + albumin (pre-renal FENa), now with evidence of overload so attempting diuresis as above.  - Creatinine stable, will continue IV diuresis as above and regular metabolic panel monitoring.   Asymptomatic bacteriuria: Negative urine culture.   Alcohol abuse: Given ativan for possible withdrawal earlier in hospitalization which caused AMS. No evidence of withdrawal at this time.  - Avoid ativan, etc.  - Cessation counseling  DVT prophylaxis: SCDs Code Status: Full Family Communication: None at bedside Disposition Plan:  Status is: Inpatient  Remains inpatient appropriate because:Inpatient level of care appropriate due to severity of illness  Dispo: The patient is from: Home              Anticipated d/c is to: Home - PT/OT updated recommendations based on improved mobility due in large part to improved volume status. Will return to [facility] [place]. Will need Rx's sent to [facility].               Patient currently  is not medically stable to d/c.   Difficult to place patient No  Consultants:  GI PCCM  Procedures:  Paracentesis 7/27  Antimicrobials: Doxycycline 7/27 -  7/29 Vancomycin, cefepime, flagyl 7/25 Ceftriaxone, azithromycin 7/25 - 7/26 Rifaximin 7/28 >>  Subjective: No dyspnea or chest pain. Getting around a bit better with continued volume removal. Urinating frequently having liquid stools with most urination. Abdominal distention slightly improved.  Objective: Vitals:   06/27/21 2323 06/28/21 0341 06/28/21 0358 06/28/21 0755  BP: 105/72 111/78  96/61  Pulse: 100 95  90  Resp: 19 16  17   Temp: 98.5 F (36.9 C) 98.4 F (36.9 C)  98.4 F (36.9 C)  TempSrc: Oral Oral  Oral  SpO2: 98% 99%  99%  Weight:   66.4 kg   Height:        Intake/Output Summary (Last 24 hours) at 06/28/2021 0832 Last data filed at 06/28/2021 0356 Gross per 24 hour  Intake 1168.85 ml  Output 2800 ml  Net -1631.15 ml   Filed Weights   06/26/21 0935 06/27/21 0443 06/28/21 0358  Weight: 71.4 kg 68.6 kg 66.4 kg   Gen: 35 y.o. female in no distress Pulm: Nonlabored breathing room air. Clear. CV: Regular rate and rhythm. No murmur, rub, or gallop. No JVD, 2+ (improving slowly) dependent edema. GI: Abdomen soft, +fluid wave, less distended, non-tender, active bowel sounds.  Ext: Warm, no deformities Skin: No new rashes, lesions or ulcers on visualized skin. Neuro: Alert and oriented. No focal neurological deficits. Psych: Judgement and insight appear fair. Mood euthymic & affect congruent. Behavior is appropriate.    Data Reviewed: I have personally reviewed following labs and imaging studies  CBC: Recent Labs  Lab 06/22/21 0024 06/23/21 0040 06/23/21 1915 06/24/21 0013 06/25/21 0114 06/27/21 0615  WBC 16.5* 17.1*  --  16.3* 15.5* 15.8*  HGB 7.4* 7.3* 8.3* 8.5* 8.2* 7.4*  HCT 22.2* 22.5* 25.3* 25.6* 24.7* 22.8*  MCV 96.9 97.8  --  95.5 96.5 97.4  PLT 75* 81*  --  78* 80* 87*   Basic Metabolic Panel: Recent Labs  Lab 06/24/21 0013 06/25/21 0114 06/26/21 0032 06/27/21 0615 06/28/21 0045  NA 131* 132* 131* 128* 128*  K 3.4* 3.3* 4.4 4.5 4.1  CL  102 103 103 101 98  CO2 20* 21* 21* 20* 23  GLUCOSE 109* 100* 87 84 105*  BUN 14 15 14 13 13   CREATININE 0.75 0.86 0.74 0.72 0.85  CALCIUM 7.3* 7.6* 7.6* 7.6* 7.7*  MG 1.9 1.4* 1.2* 1.4* 1.6*   GFR: Estimated Creatinine Clearance: 85 mL/min (by C-G formula based on SCr of 0.85 mg/dL). Liver Function Tests: Recent Labs  Lab 06/22/21 0024 06/23/21 0040  AST 92* 86*  ALT 38 37  ALKPHOS 108 124  BILITOT 7.9* 8.1*  PROT 6.6 6.8  ALBUMIN 1.4* 1.4*   No results for input(s): LIPASE, AMYLASE in the last 168 hours. No results for input(s): AMMONIA in the last 168 hours.  Coagulation Profile: No results for input(s): INR, PROTIME in the last 168 hours.  Cardiac Enzymes: No results for input(s): CKTOTAL, CKMB, CKMBINDEX, TROPONINI in the last 168 hours. BNP (last 3 results) No results for input(s): PROBNP in the last 8760 hours. HbA1C: No results for input(s): HGBA1C in the last 72 hours. CBG: No results for input(s): GLUCAP in the last 168 hours.  Lipid Profile: No results for input(s): CHOL, HDL, LDLCALC, TRIG, CHOLHDL, LDLDIRECT in the last 72 hours. Thyroid Function Tests: No results for  input(s): TSH, T4TOTAL, FREET4, T3FREE, THYROIDAB in the last 72 hours.  Anemia Panel: No results for input(s): VITAMINB12, FOLATE, FERRITIN, TIBC, IRON, RETICCTPCT in the last 72 hours. Urine analysis:    Component Value Date/Time   COLORURINE AMBER (A) 06/17/2021 1011   APPEARANCEUR HAZY (A) 06/17/2021 1011   LABSPEC 1.020 06/17/2021 1011   PHURINE 5.0 06/17/2021 1011   GLUCOSEU NEGATIVE 06/17/2021 1011   HGBUR NEGATIVE 06/17/2021 1011   BILIRUBINUR LARGE (A) 06/17/2021 1011   KETONESUR 15 (A) 06/17/2021 1011   PROTEINUR 30 (A) 06/17/2021 1011   NITRITE NEGATIVE 06/17/2021 1011   LEUKOCYTESUR MODERATE (A) 06/17/2021 1011   Recent Results (from the past 240 hour(s))  Peritoneal fluid culture w Gram Stain     Status: None   Collection Time: 06/19/21  5:48 PM   Specimen:  Peritoneal Fluid  Result Value Ref Range Status   Specimen Description FLUID PERITONEAL  Final   Special Requests NONE  Final   Gram Stain NO WBC SEEN NO ORGANISMS SEEN   Final   Culture   Final    NO GROWTH 3 DAYS Performed at [facility], [address], [place], [place] [address]    Report Status 06/23/2021 FINAL  Final      Radiology Studies: No results found.  Scheduled Meds:  folic acid  1 mg Oral Daily   Gerhardt's butt cream   Topical TID   hydrocortisone   Rectal Once   lactulose  10 g Oral Daily   magnesium oxide  800 mg Oral BID   mouth rinse  15 mL Mouth Rinse BID   multivitamin with minerals  1 tablet Oral Daily   rifaximin  550 mg Oral BID   spironolactone  100 mg Oral Daily   thiamine  100 mg Oral Daily   Or   thiamine  100 mg Intravenous Daily   Continuous Infusions:  sodium chloride 10 mL/hr at 06/19/21 1731   furosemide 62 mL/hr at 06/28/21 0341   magnesium sulfate bolus IVPB 4 g (06/28/21 0811)     LOS: 11 days   Time spent: 35 minutes.  Tyrone Nine, MD [facility] [email or web address] 06/28/2021, 8:32 AM

## 2021-06-28 NOTE — Progress Notes (Signed)
OT Cancellation Note  Patient Details Name: Candelaria Pies MRN: [id number] DOB: [birth year]   Cancelled Treatment:    Reason Eval/Treat Not Completed: Fatigue/lethargy limiting ability to participate- pt declined due to fatigue and being up most of the night.  Will follow and see as able, pt asking OT to return later today.   Barry Brunner, OT Acute Rehabilitation Services Pager [phone] Office [phone]   Chancy Milroy 06/28/2021, 9:28 AM

## 2021-06-29 DIAGNOSIS — D530 Protein deficiency anemia: Secondary | ICD-10-CM

## 2021-06-29 LAB — BASIC METABOLIC PANEL
Anion gap: 7 (ref 5–15)
BUN: 16 mg/dL (ref 6–20)
CO2: 23 mmol/L (ref 22–32)
Calcium: 7.5 mg/dL — ABNORMAL LOW (ref 8.9–10.3)
Chloride: 95 mmol/L — ABNORMAL LOW (ref 98–111)
Creatinine, Ser: 0.89 mg/dL (ref 0.44–1.00)
GFR, Estimated: >60 mL/min (ref >60)
Glucose, Bld: 116 mg/dL — ABNORMAL HIGH (ref 70–99)
Potassium: 3.8 mmol/L (ref 3.5–5.1)
Sodium: 125 mmol/L — ABNORMAL LOW (ref 135–145)

## 2021-06-29 LAB — CBC
HCT: 21.3 % — ABNORMAL LOW (ref 36.0–46.0)
Hemoglobin: 7 g/dL — ABNORMAL LOW (ref 12.0–15.0)
MCH: 32.1 pg (ref 26.0–34.0)
MCHC: 32.9 g/dL (ref 30.0–36.0)
MCV: 97.7 fL (ref 80.0–100.0)
Platelets: 98 10*3/uL — ABNORMAL LOW (ref 150–400)
RBC: 2.18 MIL/uL — ABNORMAL LOW (ref 3.87–5.11)
RDW: 26.4 % — ABNORMAL HIGH (ref 11.5–15.5)
WBC: 17.6 10*3/uL — ABNORMAL HIGH (ref 4.0–10.5)
nRBC: 0.1 % (ref 0.0–0.2)

## 2021-06-29 LAB — MAGNESIUM: Magnesium: 1.6 mg/dL — ABNORMAL LOW (ref 1.7–2.4)

## 2021-06-29 MED ORDER — MAGNESIUM SULFATE 4 GM/100ML IV SOLN
4.0000 g | Freq: Once | INTRAVENOUS | Status: AC
  Administered 2021-06-29: 4 g via INTRAVENOUS
  Filled 2021-06-29: qty 100

## 2021-06-29 NOTE — Progress Notes (Signed)
Pt's BP is running low since last evening. Latest one is 95/59. Informed to dr Natale Milch regarding the Lasix IV scheduled this morning, he said its okay to introduce and inform if pt is symptomatic. Pt is stable this point, will continue to monitor  Lonia Farber, RN

## 2021-06-29 NOTE — Progress Notes (Signed)
PROGRESS NOTE  Michelle Little  YKD:[id number] DOB: 1986/09/02 DOA: 06/17/2021 PCP: Pcp, No   Brief Narrative: Michelle Little is a 35 y.o. female with a history of EtOH cirrhosis, tobacco use who presented from [facility] due to somnolence, unresponsiveness on 7/25 requiring intubation on arrival, subsequently extubated 7/26. Initial hemoglobin noted to be 1.8 for which 5u PRBCs were given and GI consulted, though FOBT was negative. Treatment with lactulose and rifaximin for hepatic encephalopathy complicated by diarrhea has improved mentation significantly. Paracentesis 7/27 showed no SBP, transudate consistent with portal HTN. Doxycycline has been given for pneumonia and lasix has been given for persistent volume overload. Spironolactone added. Lasix dosing has been escalated with continued improvement in volume status.   Assessment & Plan: Active Problems:   Acute encephalopathy   Anemia   Hepatic encephalopathy (HCC)   Acute renal failure (HCC)   Other ascites   Cirrhosis (HCC)   Acute hepatic encephalopathy: Resolved.  - CT head nonacute. POA. - Continue rifaximin.  - Continue to hold lactulose with ongoing loose stools and resolution of encephalopathy, remains alert and oriented - Delirium precautions to continue  Decompensated alcoholic cirrhosis with ascites, hypoalbuminemia, thrombocytopenia, coagulopathy: MELD-Na score is 27. Hepatitis panel negative.  - s/p paracentesis 7/27 by PCCM negative for SBP. Ascitic fluid albumin <1. Could probably repeat prior to discharge given recurrent abdominal distention, though pt is taking po adequately and not overly concerned at this time.  - Continue lasix 120mg  IV BID. This has been an effective dose with vigorous diuresis, stable renal function. Down another 3.2L /24 hrs weight down 77kg >>66kg thus far.  - Continue fluid restriction - Continue spironolactone 100mg  (added 8/1)  Symptomatic macrocytic anemia: FOBT negative.   - Ferritin 52, iron 41, TIBC low 193. B12 1,496.  - Hgb borderline low today, status post 6u PRBC since admission - No endoscopic evaluation currently planned - FOBT negative at intake - no change in BM   Folic acid deficiency:  - Continue supplementation.   CAP: Completed 5 days abx  Leukocytosis: Persistent  -Completed treatment for pneumonia as above, no suspected infectious process ongoing, questionably reactive.   Lactic acidosis:  Resolved, elevated to 10 at intake. Suspect impaired hepatic clearance in addition to infection as etiology.   Hypokalemia:  -Currently being offset with spironolactone -no further supplementation  Hypomagnesemia, ongoing:  -In the setting of increased bowel regimen and likely poor p.o. intake secondary to above -Continue to replete as Mrs Very   AKI: Resolved (pre-renal FENa) - Now with evidence of overload so attempting diuresis as above.  - Creatinine stable, will continue IV diuresis as above and regular metabolic panel monitoring.   Asymptomatic bacteriuria: Negative urine culture.  Remains asymptomatic   Alcohol abuse:  - Given ativan for possible withdrawal earlier in hospitalization which caused AMS. No evidence of withdrawal at this time.  - Avoid ativan, etc.  - Cessation counseling ongoing  DVT prophylaxis: SCDs Code Status: Full Family Communication: None at bedside Disposition Plan:  Status is: Inpatient  Remains inpatient appropriate because:Inpatient level of care appropriate due to severity of illness  Dispo: The patient is from: Home              Anticipated d/c is to: Home - PT/OT updated recommendations based on improved mobility due in large part to improved volume status. Will return to [facility] at [place]. Will need [facility]. [facility].  Patient currently is not medically stable to d/c.   Difficult to place patient No  Consultants:  GI PCCM  Procedures:  Paracentesis  7/27  Antimicrobials: Doxycycline 7/27 - 7/29 Vancomycin, cefepime, flagyl 7/25 Ceftriaxone, azithromycin 7/25 - 7/26 Rifaximin 7/28 >>  Subjective: No acute issues or events overnight denies nausea vomiting constipation headache fevers chills or chest pain.  Loose stool ongoing, lower extremity edema ongoing with ambulatory dysfunction.  Objective: Vitals:   06/28/21 1542 06/28/21 1945 06/28/21 2307 06/29/21 0441  BP: 111/65 100/71 95/64 95/69   Pulse: 88 86 92 96  Resp: 19 18 19 16   Temp: 98.4 F (36.9 C) 98.4 F (36.9 C) 98.5 F (36.9 C) 98.2 F (36.8 C)  TempSrc: Oral Oral Oral Oral  SpO2: 99%  97% 100%  Weight:      Height:        Intake/Output Summary (Last 24 hours) at 06/29/2021 0705 Last data filed at 06/29/2021 0448 Gross per 24 hour  Intake 960 ml  Output 3150 ml  Net -2190 ml    Filed Weights   06/26/21 0935 06/27/21 0443 06/28/21 0358  Weight: 71.4 kg 68.6 kg 66.4 kg   General:  Pleasantly resting in bed, No acute distress. HEENT:  Normocephalic atraumatic.  Sclerae nonicteric, noninjected.  Extraocular movements intact bilaterally. Neck:  Without mass or deformity.  Trachea is midline. Lungs:  Clear to auscultate bilaterally without rhonchi, wheeze, or rales. Heart:  Regular rate and rhythm.  Without murmurs, rubs, or gallops. Abdomen:  Soft, nontender, nondistended.  Without guarding or rebound. Extremities: Without cyanosis, clubbing, or obvious deformity. 2+ pitting edema bilateral lower extremities to the knee Vascular:  Dorsalis pedis and posterior tibial pulses palpable bilaterally. Skin:  Warm and dry, no erythema, no ulcerations.   Data Reviewed: I have personally reviewed following labs and imaging studies  CBC: Recent Labs  Lab 06/23/21 0040 06/23/21 1915 06/24/21 0013 06/25/21 0114 06/27/21 0615 06/29/21 0519  WBC 17.1*  --  16.3* 15.5* 15.8* 17.6*  HGB 7.3* 8.3* 8.5* 8.2* 7.4* 7.0*  HCT 22.5* 25.3* 25.6* 24.7* 22.8* 21.3*  MCV 97.8   --  95.5 96.5 97.4 97.7  PLT 81*  --  78* 80* 87* 98*    Basic Metabolic Panel: Recent Labs  Lab 06/25/21 0114 06/26/21 0032 06/27/21 0615 06/28/21 0045 06/29/21 0519  NA 132* 131* 128* 128* 125*  K 3.3* 4.4 4.5 4.1 3.8  CL 103 103 101 98 95*  CO2 21* 21* 20* 23 23  GLUCOSE 100* 87 84 105* 116*  BUN 15 14 13 13 16   CREATININE 0.86 0.74 0.72 0.85 0.89  CALCIUM 7.6* 7.6* 7.6* 7.7* 7.5*  MG 1.4* 1.2* 1.4* 1.6* 1.6*    GFR: Estimated Creatinine Clearance: 81.2 mL/min (by C-G formula based on SCr of 0.89 mg/dL). Liver Function Tests: Recent Labs  Lab 06/23/21 0040  AST 86*  ALT 37  ALKPHOS 124  BILITOT 8.1*  PROT 6.8  ALBUMIN 1.4*    No results for input(s): LIPASE, AMYLASE in the last 168 hours. No results for input(s): AMMONIA in the last 168 hours.  Coagulation Profile: No results for input(s): INR, PROTIME in the last 168 hours.  Cardiac Enzymes: No results for input(s): CKTOTAL, CKMB, CKMBINDEX, TROPONINI in the last 168 hours. BNP (last 3 results) No results for input(s): PROBNP in the last 8760 hours. HbA1C: No results for input(s): HGBA1C in the last 72 hours. CBG: No results for input(s): GLUCAP in the last 168 hours.  Lipid  Profile: No results for input(s): CHOL, HDL, LDLCALC, TRIG, CHOLHDL, LDLDIRECT in the last 72 hours. Thyroid Function Tests: No results for input(s): TSH, T4TOTAL, FREET4, T3FREE, THYROIDAB in the last 72 hours.  Anemia Panel: No results for input(s): VITAMINB12, FOLATE, FERRITIN, TIBC, IRON, RETICCTPCT in the last 72 hours. Urine analysis:    Component Value Date/Time   COLORURINE AMBER (A) 06/17/2021 1011   APPEARANCEUR HAZY (A) 06/17/2021 1011   LABSPEC 1.020 06/17/2021 1011   PHURINE 5.0 06/17/2021 1011   GLUCOSEU NEGATIVE 06/17/2021 1011   HGBUR NEGATIVE 06/17/2021 1011   BILIRUBINUR LARGE (A) 06/17/2021 1011   KETONESUR 15 (A) 06/17/2021 1011   PROTEINUR 30 (A) 06/17/2021 1011   NITRITE NEGATIVE 06/17/2021 1011    LEUKOCYTESUR MODERATE (A) 06/17/2021 1011   Recent Results (from the past 240 hour(s))  Peritoneal fluid culture w Gram Stain     Status: None   Collection Time: 06/19/21  5:48 PM   Specimen: Peritoneal Fluid  Result Value Ref Range Status   Specimen Description FLUID PERITONEAL  Final   Special Requests NONE  Final   Gram Stain NO WBC SEEN NO ORGANISMS SEEN   Final   Culture   Final    NO GROWTH 3 DAYS Performed at [facility], [address] [place], [place] [address]    Report Status 06/23/2021 FINAL  Final      Radiology Studies: No results found.  Scheduled Meds:  folic acid  1 mg Oral Daily   Gerhardt's butt cream   Topical TID   hydrocortisone   Rectal Once   magnesium oxide  800 mg Oral BID   mouth rinse  15 mL Mouth Rinse BID   multivitamin with minerals  1 tablet Oral Daily   rifaximin  550 mg Oral BID   spironolactone  100 mg Oral Daily   thiamine  100 mg Oral Daily   Or   thiamine  100 mg Intravenous Daily   Continuous Infusions:  sodium chloride 10 mL/hr at 06/19/21 1731   furosemide 120 mg (06/28/21 1716)     LOS: 12 days   Time spent: 25 minutes.  Azucena Fallen, DO [facility] [email or web address] 06/29/2021, 7:05 AM

## 2021-06-30 LAB — BASIC METABOLIC PANEL
Anion gap: 9 (ref 5–15)
BUN: 15 mg/dL (ref 6–20)
CO2: 23 mmol/L (ref 22–32)
Calcium: 7.7 mg/dL — ABNORMAL LOW (ref 8.9–10.3)
Chloride: 91 mmol/L — ABNORMAL LOW (ref 98–111)
Creatinine, Ser: 0.92 mg/dL (ref 0.44–1.00)
GFR, Estimated: >60 mL/min (ref >60)
Glucose, Bld: 96 mg/dL (ref 70–99)
Potassium: 3.7 mmol/L (ref 3.5–5.1)
Sodium: 123 mmol/L — ABNORMAL LOW (ref 135–145)

## 2021-06-30 LAB — CBC
HCT: 22 % — ABNORMAL LOW (ref 36.0–46.0)
Hemoglobin: 7.5 g/dL — ABNORMAL LOW (ref 12.0–15.0)
MCH: 32.6 pg (ref 26.0–34.0)
MCHC: 34.1 g/dL (ref 30.0–36.0)
MCV: 95.7 fL (ref 80.0–100.0)
Platelets: 113 10*3/uL — ABNORMAL LOW (ref 150–400)
RBC: 2.3 MIL/uL — ABNORMAL LOW (ref 3.87–5.11)
RDW: 26.4 % — ABNORMAL HIGH (ref 11.5–15.5)
WBC: 18 10*3/uL — ABNORMAL HIGH (ref 4.0–10.5)
nRBC: 0.1 % (ref 0.0–0.2)

## 2021-06-30 MED ORDER — FUROSEMIDE 20 MG PO TABS: 20.0000 mg | ORAL_TABLET | Freq: Two times a day (BID) | ORAL | 1 refills | Status: DC

## 2021-06-30 MED ORDER — RIFAXIMIN 550 MG PO TABS: 550.0000 mg | ORAL_TABLET | Freq: Two times a day (BID) | ORAL | 1 refills | Status: DC

## 2021-06-30 MED ORDER — MAGNESIUM OXIDE -MG SUPPLEMENT 400 (240 MG) MG PO TABS: 800.0000 mg | ORAL_TABLET | Freq: Two times a day (BID) | ORAL | 0 refills | Status: AC

## 2021-06-30 MED ORDER — FOLIC ACID 1 MG PO TABS
1.0000 mg | ORAL_TABLET | Freq: Every day | ORAL | Status: DC
Start: 2021-06-30 — End: 2024-10-05

## 2021-06-30 MED ORDER — THIAMINE HCL 100 MG PO TABS: 100.0000 mg | ORAL_TABLET | Freq: Every day | ORAL | 1 refills | Status: DC

## 2021-06-30 MED ORDER — LACTULOSE 10 G PO PACK: 10.0000 g | PACK | Freq: Three times a day (TID) | ORAL | 1 refills | Status: DC

## 2021-06-30 MED ORDER — FUROSEMIDE 20 MG PO TABS: 20.0000 mg | ORAL_TABLET | Freq: Two times a day (BID) | ORAL | Status: DC

## 2021-06-30 MED ORDER — SPIRONOLACTONE 100 MG PO TABS: 100.0000 mg | ORAL_TABLET | Freq: Every day | ORAL | 1 refills | Status: DC

## 2021-06-30 MED ORDER — ADULT MULTIVITAMIN W/MINERALS CH
1.0000 | ORAL_TABLET | Freq: Every day | ORAL | 3 refills | Status: DC
Start: 2021-06-30 — End: 2024-10-05

## 2021-06-30 NOTE — Plan of Care (Signed)
  Problem: Education: Goal: Knowledge of General Education information will improve Description: Including pain rating scale, medication(s)/side effects and non-pharmacologic comfort measures Outcome: Adequate for Discharge   Problem: Health Behavior/Discharge Planning: Goal: Ability to manage health-related needs will improve Outcome: Adequate for Discharge   Problem: Clinical Measurements: Goal: Ability to maintain clinical measurements within normal limits will improve Outcome: Adequate for Discharge Goal: Will remain free from infection Outcome: Adequate for Discharge Goal: Diagnostic test results will improve Outcome: Adequate for Discharge Goal: Respiratory complications will improve Outcome: Adequate for Discharge Goal: Cardiovascular complication will be avoided Outcome: Adequate for Discharge   Problem: Activity: Goal: Risk for activity intolerance will decrease Outcome: Adequate for Discharge   Problem: Nutrition: Goal: Adequate nutrition will be maintained Outcome: Adequate for Discharge   Problem: Coping: Goal: Level of anxiety will decrease Outcome: Adequate for Discharge   Problem: Elimination: Goal: Will not experience complications related to bowel motility Outcome: Adequate for Discharge Goal: Will not experience complications related to urinary retention Outcome: Adequate for Discharge   Problem: Pain Managment: Goal: General experience of comfort will improve Outcome: Adequate for Discharge   Problem: Safety: Goal: Ability to remain free from injury will improve Outcome: Adequate for Discharge   Problem: Skin Integrity: Goal: Risk for impaired skin integrity will decrease Outcome: Adequate for Discharge   Problem: Acute Rehab PT Goals(only PT should resolve) Goal: Pt Will Go Supine/Side To Sit Outcome: Adequate for Discharge Goal: Patient Will Transfer Sit To/From Stand Outcome: Adequate for Discharge Goal: Pt Will Ambulate Outcome: Adequate  for Discharge Goal: Pt Will Go Up/Down Stairs Outcome: Adequate for Discharge Goal: Pt/caregiver will Perform Home Exercise Program Outcome: Adequate for Discharge   Problem: Acute Rehab OT Goals (only OT should resolve) Goal: Pt. Will Perform Grooming Outcome: Adequate for Discharge Goal: Pt. Will Perform Lower Body Dressing Outcome: Adequate for Discharge Goal: Pt. Will Transfer To Toilet Outcome: Adequate for Discharge Goal: Pt. Will Perform Toileting-Clothing Manipulation Outcome: Adequate for Discharge

## 2021-06-30 NOTE — TOC Transition Note (Signed)
Transition of Care [facility]) - CM/SW Discharge Note   Patient Details  Name: Michelle Little MRN: [id number] Date of Birth: 1986-06-12  Transition of Care [facility] CM/SW Contact:  Patrice Paradise, LCSW Phone Number:[phone] 06/30/2021, 1:22 PM   Clinical Narrative:    CSW was alerted that pt is ready for discharge and will need transportation back to shelter. CSW arranged a ride through safe transport.  TOC team will continue to assist with discharge planning needs.    Final next level of care: Home w Home Health Services Barriers to Discharge: No Barriers Identified   Patient Goals and CMS Choice Patient states their goals for this hospitalization and ongoing recovery are:: return to [facility] CMS Medicare.gov Compare Post Acute Care list provided to:: Patient Choice offered to / list presented to : Patient  Discharge Placement                       Discharge Plan and Services                DME Arranged: 3-N-1, Walker rolling DME Agency: [employer] Date DME Agency Contacted: 06/30/21 Time DME Agency Contacted: 1310 Representative spoke with at DME Agency: verified delivery to room HH Arranged: PT, OT HH Agency: [facility] Date [facility] Agency Contacted: 06/30/21 Time HH Agency Contacted: 1310 Representative spoke with at [facility] Agency: Stacie notified of discharge and orders requested  Social Determinants of Health (SDOH) Interventions     Readmission Risk Interventions No flowsheet data found.

## 2021-06-30 NOTE — TOC Transition Note (Signed)
Transition of Care [facility]) - CM/SW Discharge Note   Patient Details  Name: Michelle Little MRN: [id number] Date of Birth: [birth year]  Transition of [facility]) CM/SW Contact:  Bess Kinds, RN Phone Number: [phone] 06/30/2021, 1:11 PM   Clinical Narrative:     Notified by nursing of patient readiness to transition from hospital.   Patient to return to [facility] with her family. CSW arranging transportation.  Recommended DME 3N1/RW already delivered to the room - verified  Previous arrangements for charity [facility] PT/OT through [facility]. Notified Stacie at [employer] of transition. Notified MD for [facility] PT/OT orders.   Patient needing medication assistance. MATCH created with zero copay. Discussed with patient that vitamins not covered under MATCH.   Patient already set up with [facility] 9/9 for hospital follow up and to establish for primary care.   No further TOC needs identified.   Final next level of care: Home w Home Health Services Barriers to Discharge: No Barriers Identified   Patient Goals and CMS Choice Patient states their goals for this hospitalization and ongoing recovery are:: return to [facility] CMS Medicare.gov Compare Post Acute Care list provided to:: Patient Choice offered to / list presented to : Patient  Discharge Placement                       Discharge Plan and Services                DME Arranged: 3-N-1, Walker rolling DME Agency: [employer] Date DME Agency Contacted: 06/30/21 Time DME Agency Contacted: 1310 Representative spoke with at DME Agency: verified delivery to room HH Arranged: PT, OT HH Agency: [facility] Date [facility] Agency Contacted: 06/30/21 Time HH Agency Contacted: 1310 Representative spoke with at [facility] Agency: Stacie notified of discharge and orders requested  Social Determinants of Health (SDOH) Interventions     Readmission Risk Interventions No  flowsheet data found.

## 2021-06-30 NOTE — Discharge Summary (Signed)
Physician Discharge Summary  Michelle Little ATF:[id number] DOB: Nov 14, 1986 DOA: 06/17/2021  PCP: Oneita Hurt, No  Admit date: 06/17/2021 Discharge date: 06/30/2021  Admitted From: Home Disposition: Home  Recommendations for Outpatient Follow-up:  Follow up with PCP in 1-2 weeks Please obtain BMP/CBC in one week Please follow up with GI as discussed  Home Health: Home health PT Equipment/Devices: None  Discharge Condition: Stable CODE STATUS: Full Diet recommendation: Low-salt low-fat diet  Brief/Interim Summary: Michelle Little is a 35 y.o. female with a history of EtOH cirrhosis, tobacco use who presented from [facility] due to somnolence, unresponsiveness on 7/25 requiring intubation on arrival, subsequently extubated 7/26. Initial hemoglobin noted to be 1.8 for which 5u PRBCs were given and GI consulted, though FOBT was negative. Treatment with lactulose and rifaximin for hepatic encephalopathy complicated by diarrhea has improved mentation significantly. Paracentesis 7/27 showed no SBP, transudate consistent with portal HTN. Doxycycline has been given for pneumonia and lasix has been given for persistent volume overload. Spironolactone added. Lasix dosing has been escalated with continued improvement in volume status.  Acute hepatic encephalopathy: Resolved.  - CT head nonacute. POA. - Continue lactulose, discontinue rifaximin due to cost.   Decompensated alcoholic cirrhosis with ascites, hypoalbuminemia, thrombocytopenia, coagulopathy: MELD-Na score is 27. Hepatitis panel negative. - s/p paracentesis 7/27 by PCCM negative for SBP. Ascitic fluid albumin <1. Could probably repeat prior to discharge given recurrent abdominal distention, though pt is taking po adequately and not overly concerned at this time. -Transition to p.o. Lasix, continue spironolactone, continue fluid restriction and salt restriction as discussed   Symptomatic macrocytic anemia: FOBT negative.  - Ferritin  52, iron 41, TIBC low 193. B12 1,496. - Hgb stabilizing, status post 6u PRBC since admission - No endoscopic evaluation currently planned - FOBT negative at intake - no change in BM   Folic acid deficiency: - Continue supplementation.   CAP: Completed 5 days abx prior to discharge   Leukocytosis: Persistent  -Completed treatment for pneumonia as above, no suspected infectious process ongoing, questionably reactive.   Lactic acidosis:  Resolved, elevated to 10 at intake. Suspect impaired hepatic clearance in addition to infection as etiology.   Hypokalemia:  -Currently being offset with spironolactone -no further supplementation   Hypomagnesemia, ongoing: -Continue magnesium OTC as discussed   AKI: Resolved (pre-renal FENa) - Now with evidence of overload so attempting diuresis as above. - Creatinine stable, will continue IV diuresis as above and regular metabolic panel monitoring.   Asymptomatic bacteriuria: Negative urine culture.  Remains asymptomatic   Alcohol abuse:  - Given ativan for possible withdrawal earlier in hospitalization which caused AMS. No evidence of withdrawal at this time. - Avoid ativan, etc. - Cessation counseling ongoing    Discharge Instructions  Discharge Instructions     Diet - low sodium heart healthy   Complete by: As directed    Increase activity slowly   Complete by: As directed       Allergies as of 06/30/2021   No Known Allergies      Medication List     STOP taking these medications    ibuprofen 200 MG tablet Commonly known as: ADVIL       TAKE these medications    folic acid 1 MG tablet Commonly known as: FOLVITE Take 1 tablet (1 mg total) by mouth daily.   furosemide 20 MG tablet Commonly known as: LASIX Take 1 tablet (20 mg total) by mouth 2 (two) times daily.   lactulose 10 g packet Commonly  known as: CEPHULAC Take 1 packet (10 g total) by mouth 3 (three) times daily. Notes to patient: Please take these as  instructed. Diarrhea is not a reason to stop medication.   magnesium oxide 400 (240 Mg) MG tablet Commonly known as: MAG-OX Take 2 tablets (800 mg total) by mouth 2 (two) times daily.   multivitamin with minerals Tabs tablet Take 1 tablet by mouth daily.   spironolactone 100 MG tablet Commonly known as: ALDACTONE Take 1 tablet (100 mg total) by mouth daily.   thiamine 100 MG tablet Take 1 tablet (100 mg total) by mouth daily.               Durable Medical Equipment  (From admission, onward)           Start     Ordered   06/24/21 1606  For home use only DME Walker rolling  Once       Question Answer Comment  Walker: With 5 Inch Wheels   Patient needs a walker to treat with the following condition Weakness      06/24/21 1605   06/24/21 1606  For home use only DME 3 n 1  Once        06/24/21 1605            Follow-up Information     [facility] Follow up on 08/02/2021.   Specialty: Family Medicine Why: 9:30 for hospital follow up and establish PCP Contact information: [address] [place] [address] [phone]        [facility] Follow up.   Why: you will go her to get refills on your medications Contact information: [address] [facility] [address] [phone]        [facility] Follow up.   Why: rolling walker, 3 n 1 Contact information: [address] [place] [address] [phone]         [facility] Follow up.   Specialty: Home Health Services Why: HHPT, HHOT Contact information: [address] [place] [address] [phone]                No Known Allergies  Procedures/Studies: CT Head Wo Contrast  Result Date: 06/17/2021 CLINICAL DATA:  Altered mental status EXAM: CT HEAD WITHOUT CONTRAST TECHNIQUE: Contiguous axial images were obtained from the base of the skull through the  vertex without intravenous contrast. COMPARISON:  None. FINDINGS: Brain: No evidence of acute infarction, hemorrhage, hydrocephalus, extra-axial collection or mass lesion/mass effect. Vascular: No hyperdense vessel or unexpected calcification. Skull: Normal. Negative for fracture or focal lesion. Sinuses/Orbits: No acute finding. Other: None. IMPRESSION: Normal head CT without contrast for age Electronically Signed   By: Judie Petit.  Shick M.D.   On: 06/17/2021 16:48   DG CHEST PORT 1 VIEW  Result Date: 06/20/2021 CLINICAL DATA:  Wheezing EXAM: PORTABLE CHEST 1 VIEW COMPARISON:  Radiograph 02/16/2021 FINDINGS: Increasing bilateral perihilar opacities with cardiomegaly and indistinct pulmonary vascularity suggestive cardiogenic pulmonary edema. No visible layering pleural effusion. No pneumothorax. No acute abnormality of the chest wall. Telemetry leads overlie the chest. IMPRESSION: Increasing bilateral perihilar opacity compared to most recent radiography. With cardiomegaly and indistinct pulmonary vascularity, suspect cardiogenic pulmonary edema though infection or ARDS could have this appearance in the appropriate clinical setting. Electronically Signed   By: Kreg Shropshire M.D.   On: 06/20/2021 00:37   DG Chest [facility] [address]  Result Date: 06/18/2021 CLINICAL DATA:  Acute respiratory failure.  Hypoxia. EXAM: PORTABLE CHEST 1 VIEW COMPARISON:  06/17/2021. FINDINGS: Endotracheal tube and NG tube in stable position. Stable cardiomegaly. Low lung volumes. Diffuse bilateral pulmonary infiltrates/edema again noted without interim change. No pleural effusion or pneumothorax. IMPRESSION: 1.  Lines and tubes stable position 2.  Stable cardiomegaly. 3. Low lung volumes. Diffuse bilateral pulmonary infiltrates/edema again noted. No interim change. Electronically Signed   By: Maisie Fus  Register   On: 06/18/2021 07:15   DG CHEST PORT 1 VIEW  Result Date: 06/17/2021 CLINICAL DATA:  Endotracheal tube EXAM: PORTABLE CHEST 1 VIEW  COMPARISON:  Same-day chest radiographs FINDINGS: Interval placement of endotracheal tube, tip positioned in the mid trachea. Interval placement of esophagogastric tube, tip and side port below the diaphragm. Cardiomegaly. Bilateral interstitial and heterogeneous airspace opacity. IMPRESSION: 1. Interval placement of endotracheal tube, tip positioned in the mid trachea. 2. Interval placement of esophagogastric tube, tip and side port below the diaphragm. 3. Bilateral interstitial and heterogeneous airspace opacity, consistent with edema and/or infection. 4. Cardiomegaly. Electronically Signed   By: Lauralyn Primes M.D.   On: 06/17/2021 20:24   DG Chest Port 1 View  Result Date: 06/17/2021 CLINICAL DATA:  Altered mental status Sepsis? EXAM: PORTABLE CHEST 1 VIEW COMPARISON:  None. FINDINGS: Heart size at upper limits of normal. Mild pulmonary vascular congestion. Bilateral perihilar and basilar airspace opacities. IMPRESSION: Bilateral lung opacities suspicious for multifocal pneumonia. Electronically Signed   By: Acquanetta Belling M.D.   On: 06/17/2021 11:01   DG Abd Portable 1V  Result Date: 06/17/2021 CLINICAL DATA:  Orogastric placement. EXAM: PORTABLE ABDOMEN - 1 VIEW COMPARISON:  None. FINDINGS: Orogastric tube enters the stomach, curves along the greater curvature with its tip in the antrum. IMPRESSION: Orogastric tube tip in the region of the gastric antrum. Electronically Signed   By: Paulina Fusi M.D.   On: 06/17/2021 19:29   EEG adult  Result Date: 06/17/2021 Charlsie Quest, MD     06/17/2021  9:26 PM Patient Name: Andreyah Natividad MRN: [id number] Epilepsy Attending: Charlsie Quest Referring Physician/Provider: Pia Mau, PA Date: 06/17/2021 Duration: 23.27mins Patient history: 35yo F with AMS. EEG to evaluate for seizure. Level of alertness:  lethargic AEDs during EEG study: LEV, propofol Technical aspects: This EEG study was done with scalp electrodes positioned according to the 10-20 International  system of electrode placement. Electrical activity was acquired at a sampling rate of 500Hz  and reviewed with a high frequency filter of 70Hz  and a low frequency filter of 1Hz . EEG data were recorded continuously and digitally stored. Description: EEG showed continuous generalized 3 to 6 Hz theta-delta slowing. Generalized periodic discharges with triphasic morphology at  1-1.5Hz  were also noted. Hyperventilation and photic stimulation were not performed.   ABNORMALITY - Periodic discharges with triphasic morphology, generalized ( GPDs) - Continuous slow, generalized IMPRESSION: This study showed generalized periodic discharges with triphasic morphology at  1-1.5Hz  which is on the ictal-interictal continuum with low to intermediate potential for seizures. Additionally, there is severe diffuse encephalopathy, nonspecific etiology but likely related to sedation, toxic-metabolic etiology. No seizures  were seen throughout the recording. Priyanka   Overnight EEG with video  Result Date: 06/18/2021 , MD     06/18/2021  1:52 PM Patient Name: Tykiera Raven MRN: Charlsie Quest Epilepsy Attending: 06/20/2021 Referring Physician/Provider: Dr Leland Her Duration: 06/17/2021 2127 to 06/18/2021 1142  Patient history: 35yo F with AMS. EEG to evaluate for seizure.  Level of alertness:  awake, asleep  AEDs during EEG study: LEV  Technical aspects: This EEG study was done with scalp electrodes positioned according to the 10-20 International system of electrode placement. Electrical activity was acquired at a sampling rate of  and reviewed with a high frequency filter of  and a low frequency filter of . EEG data were recorded continuously and digitally stored.  Description: No clear posterior dominant rhythm was seen.  Sleep was characterized by sleep spindles (12 to 14 Hz), maximum frontocentral region. EEG showed continuous generalized 3 to 6 Hz theta-delta slowing. Intermittent  generalized periodic discharges with triphasic morphology at  1-1.5Hz  were also noted, more prominent when awake/stimulated. Hyperventilation and photic stimulation were not performed.    ABNORMALITY - Periodic discharges with triphasic morphology, generalized ( GPDs) - Continuous slow, generalized  IMPRESSION: This study showed generalized periodic discharges with triphasic morphology at  1-1.5Hz  which is on the ictal-interictal continuum.  However, the morphology, frequency and reactivity is more commonly seen due to toxic-metabolic encephalopathy. Additionally, there is severe diffuse encephalopathy, nonspecific etiology but likely related to sedation, toxic-metabolic etiology. No seizures  were seen throughout the recording.  Charlsie Quest   ECHOCARDIOGRAM COMPLETE  Result Date: 06/18/2021    ECHOCARDIOGRAM REPORT   Patient Name:   [facility] Date of Exam: 06/18/2021 Medical Rec #:  [id number]      Height:       64.0 in Accession #:    [id number]     Weight:       166.0 lb Date of Birth:  14-Feb-1986      BSA:          1.807 m Patient Age:    35 years       BP:           95/60 mmHg Patient Gender: F              HR:           83 bpm. Exam Location:  Inpatient Procedure: 2D Echo, Cardiac Doppler and Color Doppler Indications:    Cirrhosis (HCC) [[id number]]  History:        Patient has no prior history of Echocardiogram examinations.  Sonographer:    Eulah Pont RDCS Referring Phys: Dixon Boos DOUGLAS B MCQUAID IMPRESSIONS  1. Left ventricular ejection fraction, by estimation, is 55 to 60%. The left ventricle has normal function. The left ventricle has no regional wall motion abnormalities. Indeterminate diastolic filling due to E-A fusion.  2. Right ventricular systolic function is normal. The right ventricular size is normal. There is mildly elevated pulmonary artery systolic pressure.  3. Left atrial size was severely dilated.  4. Small pericardial effusion adjacent to the posterolateral myocardium.  5. The  mitral valve is normal in structure. Mild mitral valve regurgitation. No evidence of mitral stenosis.  6. The aortic valve is tricuspid. Aortic valve regurgitation is not visualized. No aortic stenosis is present.  7. The inferior vena cava is dilated in size with <50% respiratory variability, suggesting right atrial pressure of 15 mmHg. FINDINGS  Left Ventricle: Left ventricular ejection fraction, by estimation, is 55 to 60%. The left ventricle has normal function. The left ventricle has no regional wall motion abnormalities. The left ventricular internal cavity size was normal in size. There is  no left ventricular hypertrophy. Indeterminate diastolic filling due to E-A fusion. Right Ventricle: The right ventricular size is normal. No increase in right ventricular wall thickness. Right ventricular systolic function is  normal. There is mildly elevated pulmonary artery systolic pressure. The tricuspid regurgitant velocity is 2.49  m/s, and with an assumed right atrial pressure of 15 mmHg, the estimated right ventricular systolic pressure is 39.8 mmHg. Left Atrium: Left atrial size was severely dilated. Right Atrium: Right atrial size was normal in size. Pericardium: Small pericardial effusion adjacent to the posterolateral myocardium. There is no evidence of pericardial effusion. Mitral Valve: The mitral valve is normal in structure. Mild mitral valve regurgitation, with posteriorly-directed jet. No evidence of mitral valve stenosis. Tricuspid Valve: The tricuspid valve is normal in structure. Tricuspid valve regurgitation is mild . No evidence of tricuspid stenosis. Aortic Valve: The aortic valve is tricuspid. Aortic valve regurgitation is not visualized. No aortic stenosis is present. Pulmonic Valve: The pulmonic valve was normal in structure. Pulmonic valve regurgitation is not visualized. No evidence of pulmonic stenosis. Aorta: The aortic root is normal in size and structure. Venous: The inferior vena cava is  dilated in size with less than 50% respiratory variability, suggesting right atrial pressure of 15 mmHg. IAS/Shunts: No atrial level shunt detected by color flow Doppler.  LEFT VENTRICLE PLAX 2D LVIDd:         5.00 cm  Diastology LVIDs:         3.50 cm  LV e' medial:    8.19 cm/s LV PW:         1.10 cm  LV E/e' medial:  13.4 LV IVS:        0.80 cm  LV e' lateral:   9.89 cm/s LVOT diam:     2.10 cm  LV E/e' lateral: 11.1 LV SV:         76 LV SV Index:   42 LVOT Area:     3.46 cm  RIGHT VENTRICLE RV S prime:     11.90 cm/s TAPSE (M-mode): 3.0 cm LEFT ATRIUM             Index       RIGHT ATRIUM           Index LA diam:        4.60 cm 2.55 cm/m  RA Area:     17.40 cm LA Vol (A2C):   83.7 ml 46.31 ml/m RA Volume:   50.70 ml  28.05 ml/m LA Vol (A4C):   83.7 ml 46.31 ml/m LA Biplane Vol: 87.3 ml 48.30 ml/m  AORTIC VALVE LVOT Vmax:   106.00 cm/s LVOT Vmean:  79.100 cm/s LVOT VTI:    0.220 m  AORTA Ao Root diam: 3.20 cm Ao Asc diam:  3.10 cm MITRAL VALVE                TRICUSPID VALVE MV Area (PHT): 4.39 cm     TR Peak grad:   24.8 mmHg MV Decel Time: 173 msec     TR Vmax:        249.00 cm/s MR PISA:        0.57 cm MR PISA Radius: 0.30 cm     SHUNTS MV E velocity: 110.00 cm/s  Systemic VTI:  0.22 m MV A velocity: 90.30 cm/s   Systemic Diam: 2.10 cm MV E/A ratio:  1.22 Chilton Si MD Electronically signed by Chilton Si MD Signature Date/Time: 06/18/2021/5:17:38 PM    Final    CT CHEST ABDOMEN PELVIS WO CONTRAST  Result Date: 06/17/2021 CLINICAL DATA:  Sepsis, severe anemia fall EXAM: CT CHEST, ABDOMEN AND PELVIS WITHOUT CONTRAST TECHNIQUE: Multidetector CT imaging of the chest,  abdomen and pelvis was performed following the standard protocol without IV contrast. COMPARISON:  None. FINDINGS: CT CHEST FINDINGS Cardiovascular: Limited evaluation in the absence of contrast media. Hypoattenuation of the cardiac blood pool compatible with reported anemia. Cardiomegaly. No pericardial effusion. Central  pulmonary arteries are normal caliber. Normal caliber aorta. Luminal assessment of the vasculature precluded in the absence of contrast media. Mediastinum/Nodes: Edematous changes noted throughout the mediastinum without free mediastinal fluid or gas. Normal thyroid gland and thoracic inlet. No acute abnormality of the trachea or esophagus. No worrisome mediastinal or axillary adenopathy. Hilar nodal evaluation is limited in the absence of intravenous contrast media. Lungs/Pleura: Right pleural effusion trace left effusion. There are perihilar predominant areas of patchy and ground-glass opacity with additional dependent consolidative density in the left lung base as well. No pneumothorax. Slight asymmetry of the chest wall some which may be attributable to atelectatic volume loss. Musculoskeletal: Asymmetric chest wall. No acute osseous abnormality or suspicious osseous lesion. Diffuse body wall edema. Relative paucity of subcutaneous fat noted incidentally. CT ABDOMEN PELVIS FINDINGS Hepatobiliary: Enlarged, hypoattenuating and slightly heterogeneous liver with a nodular surface contour and perihepatic ascites. No focal liver lesion. Gallbladder contains a partially calcified gallstones towards the gallbladder neck. Mild wall thickening nonspecific in the setting of presumed liver disease and ascites. Pancreas: No pancreatic ductal dilatation or surrounding inflammatory changes. Spleen: Splenomegaly.  No concerning focal splenic lesion. Adrenals/Urinary Tract: No focal adrenal nodule or mass. Bilateral nonspecific perinephric stranding. No visible or contour deforming renal lesion. Nonobstructing calculi present in the bilateral lower poles measuring up to 3 mm in the right and 2 mm on the left. No obstructive urolithiasis or hydronephrosis. Mild circumferential bladder wall thickening noted, possibly related to underdistention. Stomach/Bowel: Distal esophagus, stomach and duodenum are unremarkable. Diffusely  edematous appearance of the large and small bowel. No evidence of obstruction. Appendix in the right lower quadrant without focal appendiceal inflammation. Vascular/Lymphatic: Limited assessment of the vasculature in the absence of contrast media. Suspect some degree of upper abdominal venous collateralization diffuse edematous changes throughout the mesentery in abdomen limiting assessment lymph nodes. Edematous central mesenteric nodes are present. Question an enlarged node versus decompressed loop of bowel near the level of the aortic bifurcation (3/81) measuring up to 16 mm short axis. Reproductive: No concerning adnexal masses. Few benign calcification associated with what appears to be the right ovary in the right adnexa. Rounded peripheral calcification of a probable intramural fibroid in the deep pelvis measuring 5.4 cm in size (3/98). Other: Diffuse circumferential body wall edema extensive edematous changes throughout the mesentery as well as moderate volume ascites. No bowel containing hernia. Musculoskeletal: No acute osseous abnormality or suspicious osseous lesion. Included portions of the forearms included within the level of imaging are unremarkable. IMPRESSION: Features of diffuse volume overload/anasarca with mediastinal, peritoneal and diffuse body wall edematous changes moderate volume ascites and layering bilateral effusions as well, right greater than left. Perihilar predominant patchy and ground-glass opacities in both lungs, may reflect developing alveolar edema, infection, ARDS or some combination there of. Stigmata of cirrhosis/intrinsic liver disease with a heterogeneous, hypoattenuating and nodular liver. Correlate with patient history and LFTs. Question several enlarged retroperitoneal lymph nodes versus decompressed bowel loops towards the level of the I ordered bifurcation. Difficult to fully ascertain in the absence of contrast media. Mild gallbladder wall thickening is nonspecific in  the setting of presumed liver disease. Could correlate for focal symptoms. The diffusely edematous appearance of the bowel favored to be related to  the process of anasarca rather than enterocolitis though could correlate for clinical symptoms. Cardiomegaly. Hypoattenuation of the cardiac blood pool compatible with anemia. Electronically Signed   By: Kreg Shropshire M.D.   On: 06/17/2021 19:26   US Abdomen Limited RUQ (LIVER/GB)  Result Date: 06/18/2021 CLINICAL DATA:  Cirrhosis EXAM: ULTRASOUND ABDOMEN LIMITED RIGHT UPPER QUADRANT COMPARISON:  None. FINDINGS: Gallbladder: No gallstones or wall thickening visualized. Sludge is present. No sonographic Murphy sign noted by sonographer. Common bile duct: Diameter: 2.5 mm, normal Liver: No focal lesion identified. Increased parenchymal echogenicity. Coarsening of echotexture. Nodular surface contour. Portal vein is patent on color Doppler imaging with reversal of normal direction of blood flow towards the liver. Other: Right pleural effusion.  Ascites. IMPRESSION: Cirrhosis with evidence of portal hypertension. There is reversal of flow in the portal vein. Gallbladder sludge. Ascites and right pleural effusion. Electronically Signed   By: Guadlupe Spanish M.D.   On: 06/18/2021 18:31     Subjective: No acute issues or events overnight denies nausea vomiting diarrhea constipation headache fevers chills or chest pain   Discharge Exam: Vitals:   06/30/21 0716 06/30/21 1130  BP: 102/63 113/79  Pulse: 86 89  Resp: 12   Temp: 98.3 F (36.8 C) 98 F (36.7 C)  SpO2: 99% 100%   Vitals:   06/30/21 0347 06/30/21 0453 06/30/21 0716 06/30/21 1130  BP: (!) 105/59  102/63 113/79  Pulse: 86  86 89  Resp: (!) 23  12   Temp: 98.4 F (36.9 C)  98.3 F (36.8 C) 98 F (36.7 C)  TempSrc: Oral  Oral Oral  SpO2: 98%  99% 100%  Weight:  62.9 kg    Height:        General: Pt is alert, awake, not in acute distress Cardiovascular: RRR, S1/S2 +, no rubs, no  gallops Respiratory: CTA bilaterally, no wheezing, no rhonchi Abdominal: Soft, NT, ND, bowel sounds + Extremities: Left lower extremity 1+ edema, no cyanosis    The results of significant diagnostics from this hospitalization (including imaging, microbiology, ancillary and laboratory) are listed below for reference.     Microbiology: No results found for this or any previous visit (from the past 240 hour(s)).   Labs: BNP (last 3 results) No results for input(s): BNP in the last 8760 hours. Basic Metabolic Panel: Recent Labs  Lab 06/25/21 0114 06/26/21 0032 06/27/21 0615 06/28/21 0045 06/29/21 0519 06/30/21 0016  NA 132* 131* 128* 128* 125* 123*  K 3.3* 4.4 4.5 4.1 3.8 3.7  CL 103 103 101 98 95* 91*  CO2 21* 21* 20* GLUCOSE 100* 87 84 105* 116* 96  BUN CREATININE 0.86 0.74 0.72 0.85 0.89 0.92  CALCIUM 7.6* 7.6* 7.6* 7.7* 7.5* 7.7*  MG 1.4* 1.2* 1.4* 1.6* 1.6*  --    Liver Function Tests: No results for input(s): AST, ALT, ALKPHOS, BILITOT, PROT, ALBUMIN in the last 168 hours. No results for input(s): LIPASE, AMYLASE in the last 168 hours. No results for input(s): AMMONIA in the last 168 hours. CBC: Recent Labs  Lab 06/24/21 0013 06/25/21 0114 06/27/21 0615 06/29/21 0519 06/30/21 0016  WBC 16.3* 15.5* 15.8* 17.6* 18.0*  HGB 8.5* 8.2* 7.4* 7.0* 7.5*  HCT 25.6* 24.7* 22.8* 21.3* 22.0*  MCV 95.5 96.5 97.4 97.7 95.7  PLT 78* 80* 87* 98* 113*   Cardiac Enzymes: No results for input(s): CKTOTAL, CKMB, CKMBINDEX, TROPONINI in the last 168 hours. BNP: Invalid input(s):  POCBNP CBG: No results for input(s): GLUCAP in the last 168 hours. D-Dimer No results for input(s): DDIMER in the last 72 hours. Hgb A1c No results for input(s): HGBA1C in the last 72 hours. Lipid Profile No results for input(s): CHOL, HDL, LDLCALC, TRIG, CHOLHDL, LDLDIRECT in the last 72 hours. Thyroid function studies No results for input(s): TSH, T4TOTAL, T3FREE,  THYROIDAB in the last 72 hours.  Invalid input(s): FREET3 Anemia work up No results for input(s): VITAMINB12, FOLATE, FERRITIN, TIBC, IRON, RETICCTPCT in the last 72 hours. Urinalysis    Component Value Date/Time   COLORURINE AMBER (A) 06/17/2021 1011   APPEARANCEUR HAZY (A) 06/17/2021 1011   LABSPEC 1.020 06/17/2021 1011   PHURINE 5.0 06/17/2021 1011   GLUCOSEU NEGATIVE 06/17/2021 1011   HGBUR NEGATIVE 06/17/2021 1011   BILIRUBINUR LARGE (A) 06/17/2021 1011   KETONESUR 15 (A) 06/17/2021 1011   PROTEINUR 30 (A) 06/17/2021 1011   NITRITE NEGATIVE 06/17/2021 1011   LEUKOCYTESUR MODERATE (A) 06/17/2021 1011   Sepsis Labs Invalid input(s): PROCALCITONIN,  WBC,  LACTICIDVEN Microbiology No results found for this or any previous visit (from the past 240 hour(s)).   Time coordinating discharge: Over 30 minutes  SIGNED:   Azucena Fallen, DO [facility] 06/30/2021, 1:38 PM Pager   If 7PM-7AM, please contact night-coverage [email or web address]

## 2021-07-03 LAB — PATHOLOGIST SMEAR REVIEW

## 2021-07-07 ENCOUNTER — Emergency Department (HOSPITAL_COMMUNITY)
Admission: EM | Admit: 2021-07-07 | Discharge: 2021-07-07 | Disposition: A | Discharge status: Home / Self Care | Payer: Medicaid Other | Attending: Emergency Medicine | Admitting: Emergency Medicine

## 2021-07-07 ENCOUNTER — Encounter (HOSPITAL_COMMUNITY): Payer: Self-pay

## 2021-07-07 ENCOUNTER — Other Ambulatory Visit: Payer: Self-pay

## 2021-07-07 DIAGNOSIS — K7031 Alcoholic cirrhosis of liver with ascites: Secondary | ICD-10-CM | POA: Insufficient documentation

## 2021-07-07 DIAGNOSIS — R58 Hemorrhage, not elsewhere classified: Secondary | ICD-10-CM | POA: Diagnosis present

## 2021-07-07 LAB — CBC WITH DIFFERENTIAL/PLATELET
Abs Immature Granulocytes: 0.2 K/uL — ABNORMAL HIGH (ref 0.00–0.07)
Basophils Absolute: 0.1 K/uL (ref 0.0–0.1)
Basophils Relative: 1 %
Eosinophils Absolute: 0.2 K/uL (ref 0.0–0.5)
Eosinophils Relative: 1 %
HCT: 25.2 % — ABNORMAL LOW (ref 36.0–46.0)
Hemoglobin: 7.9 g/dL — ABNORMAL LOW (ref 12.0–15.0)
Immature Granulocytes: 2 %
Lymphocytes Relative: 8 %
Lymphs Abs: 1 K/uL (ref 0.7–4.0)
MCH: 33.1 pg (ref 26.0–34.0)
MCHC: 31.3 g/dL (ref 30.0–36.0)
MCV: 105.4 fL — ABNORMAL HIGH (ref 80.0–100.0)
Monocytes Absolute: 1.2 K/uL — ABNORMAL HIGH (ref 0.1–1.0)
Monocytes Relative: 9 %
Neutro Abs: 10.3 K/uL — ABNORMAL HIGH (ref 1.7–7.7)
Neutrophils Relative %: 79 %
Platelets: 141 K/uL — ABNORMAL LOW (ref 150–400)
RBC: 2.39 MIL/uL — ABNORMAL LOW (ref 3.87–5.11)
RDW: 27.9 % — ABNORMAL HIGH (ref 11.5–15.5)
WBC: 12.9 K/uL — ABNORMAL HIGH (ref 4.0–10.5)
nRBC: 0.2 % (ref 0.0–0.2)

## 2021-07-07 LAB — COMPREHENSIVE METABOLIC PANEL
ALT: 39 U/L (ref 0–44)
AST: 91 U/L — ABNORMAL HIGH (ref 15–41)
Albumin: 1.4 g/dL — ABNORMAL LOW (ref 3.5–5.0)
Alkaline Phosphatase: 188 U/L — ABNORMAL HIGH (ref 38–126)
Anion gap: 9 (ref 5–15)
BUN: 11 mg/dL (ref 6–20)
CO2: 19 mmol/L — ABNORMAL LOW (ref 22–32)
Calcium: 8.1 mg/dL — ABNORMAL LOW (ref 8.9–10.3)
Chloride: 100 mmol/L (ref 98–111)
Creatinine, Ser: 1.01 mg/dL — ABNORMAL HIGH (ref 0.44–1.00)
GFR, Estimated: >60 mL/min (ref >60)
Glucose, Bld: 98 mg/dL (ref 70–99)
Potassium: 4.1 mmol/L (ref 3.5–5.1)
Sodium: 128 mmol/L — ABNORMAL LOW (ref 135–145)
Total Bilirubin: 18.9 mg/dL (ref 0.3–1.2)
Total Protein: 8.1 g/dL (ref 6.5–8.1)

## 2021-07-07 LAB — PROTIME-INR
INR: 2.5 — ABNORMAL HIGH (ref 0.8–1.2)
Prothrombin Time: 26.7 s — ABNORMAL HIGH (ref 11.4–15.2)

## 2021-07-07 NOTE — ED Notes (Signed)
Pt called again to be taken back to a room. No response.

## 2021-07-07 NOTE — Discharge Instructions (Addendum)
As we discussed, your liver tests are worse than they were when you left the hospital.  Continue to abstain from alcohol as you have been doing.  Follow-up with the GI doctors for further evaluation.  They should call you to schedule a close follow-up.  Return to the ER for confusion, fever or other concerning symptoms

## 2021-07-07 NOTE — ED Triage Notes (Signed)
Patient complains of bleeding from paracentesis procedure site and patient states that she accidentally scraped scab off last night. Arrived with dressing to same with ongoing bleeding

## 2021-07-07 NOTE — ED Provider Notes (Signed)
Emergency Medicine Provider Triage Evaluation Note  Michelle Little , a 35 y.o. female  was evaluated in triage.  Pt complains of bleeding to recent paracentesis site that began last night. Pt was admitted to the hospital from 07/25-08/48for acute hepatic encephalopathy and decompensated alcoholic cirrhosis. She had her first paracentesis done at that time and states that last night she accidentally scratched the scab from her paracentesis site and is began bleeding. She attempted to stop the bleeding by holding pressure without success. She has also been applying bandages with guaze. She woke up this morning with a large amount of blood on her shirt with continued bleeding. She was advised to come to the ED for further eval. No other complaints at this time. Reports her hgb at time of admission was 1.8 and she required 5 units PRBCs. Denies lightheadedness or  dizziness currently.   Review of Systems  Positive: + bleeding from procedure site Negative: - chest pain, SOB, lightheadedness  Physical Exam  BP 115/71   Pulse (!) 114   Temp 98.5 F (36.9 C) (Oral)   Resp 18   SpO2 100%  Gen:   Awake, no distress   Resp:  Normal effort  MSK:   Moves extremities without difficulty  Other:  Bandage to right abdomen removed; small puncture wound to abdomen with light amount of bleeding  Medical Decision Making  Medically screening exam initiated at 10:46 AM.  Appropriate orders placed.  Michelle Little was informed that the remainder of the evaluation will be completed by another provider, this initial triage assessment does not replace that evaluation, and the importance of remaining in the ED until their evaluation is complete.  Labs ordered. Recent admission requiring 5 units PRBCs for Hgb 1.8. Dressing placed back to abdomen and pt instructed to hold pressure.    Michelle Rockers, PA-C 07/07/21 1050    Little, Michelle Repress, MD 07/07/21 1530

## 2021-07-07 NOTE — ED Provider Notes (Addendum)
[facility] EMERGENCY DEPARTMENT Provider Note   CSN: [id number] Arrival date & time: 07/07/21  1031     History No chief complaint on file.   Michelle Little is a 35 y.o. female.  HPI  Patient was recently admitted to the hospital on July 25 and discharged on August 7.  Patient was treated for decompensated alcoholic cirrhosis.  Patient had a paracentesis performed during that evaluation.  Patient states excellently since she scratched the scab from her paracentesis site and has been bleeding.  She tried applying pressure without relief.  She woke up this morning and noted persistent bleeding  Past Medical History:  Diagnosis Date   Thyroid disease     Patient Active Problem List   Diagnosis Date Noted   Cirrhosis (HCC)    Anemia    Hepatic encephalopathy (HCC)    Acute renal failure (HCC)    Other ascites    Acute encephalopathy 06/17/2021    History reviewed. No pertinent surgical history.   OB History   No obstetric history on file.     No family history on file.     Home Medications Prior to Admission medications   Medication Sig Start Date End Date Taking? Authorizing Provider  folic acid (FOLVITE) 1 MG tablet Take 1 tablet (1 mg total) by mouth daily. 06/30/21   Azucena Fallen, MD  furosemide (LASIX) 20 MG tablet Take 1 tablet (20 mg total) by mouth 2 (two) times daily. 06/30/21   Azucena Fallen, MD  lactulose (CEPHULAC) 10 g packet Take 1 packet (10 g total) by mouth 3 (three) times daily. 06/30/21   Azucena Fallen, MD  magnesium oxide (MAG-OX) 400 (240 Mg) MG tablet Take 2 tablets (800 mg total) by mouth 2 (two) times daily. 06/30/21 07/30/21  Azucena Fallen, MD  Multiple Vitamin (MULTIVITAMIN WITH MINERALS) TABS tablet Take 1 tablet by mouth daily. 06/30/21   Azucena Fallen, MD  spironolactone (ALDACTONE) 100 MG tablet Take 1 tablet (100 mg total) by mouth daily. 06/30/21   Azucena Fallen, MD  thiamine 100 MG tablet  Take 1 tablet (100 mg total) by mouth daily. 06/30/21   Azucena Fallen, MD    Allergies    Patient has no known allergies.  Review of Systems   Review of Systems  Physical Exam Updated Vital Signs BP 106/65   Pulse 80   Temp 98.8 F (37.1 C) (Oral)   Resp 17   SpO2 100%   Physical Exam  ED Results / Procedures / Treatments   Labs (all labs ordered are listed, but only abnormal results are displayed) Labs Reviewed  COMPREHENSIVE METABOLIC PANEL - Abnormal; Notable for the following components:      Result Value   Sodium 128 (*)    CO2 19 (*)    Creatinine, Ser 1.01 (*)    Calcium 8.1 (*)    Albumin 1.4 (*)    AST 91 (*)    Alkaline Phosphatase 188 (*)    Total Bilirubin 18.9 (*)    All other components within normal limits  CBC WITH DIFFERENTIAL/PLATELET - Abnormal; Notable for the following components:   WBC 12.9 (*)    RBC 2.39 (*)    Hemoglobin 7.9 (*)    HCT 25.2 (*)    MCV 105.4 (*)    RDW 27.9 (*)    Platelets 141 (*)    Neutro Abs 10.3 (*)    Monocytes Absolute 1.2 (*)  Abs Immature Granulocytes 0.20 (*)    All other components within normal limits  PROTIME-INR - Abnormal; Notable for the following components:   Prothrombin Time 26.7 (*)    INR 2.5 (*)    All other components within normal limits  TYPE AND SCREEN    EKG None  Radiology No results found.  Procedures Procedures  Thrombi pad placed on the small area of bleeding.  Pressure dressing applied  Medications Ordered in ED Medications - No data to display  ED Course  I have reviewed the triage vital signs and the nursing notes.  Pertinent labs & imaging results that were available during my care of the patient were reviewed by me and considered in my medical decision making (see chart for details).  Clinical Course as of 07/07/21 1753  Sun Jul 07, 2021  1557 Labs reviewed.  Hemoglobin is stable compared to 7 days ago [JK]  1557 Sodium level is similar compared to previous  [JK]  1559 Bilirubin was 8.1, 2 weeks ago [JK]  1559 INR continues to be elevated, increased from previous value [JK]  1722 D/w Dr Rhea Belton.  Options would be close follow-up in the GI office versus admission for observation and GI consultation to discuss possible steroid use.  I offered admission to the patient and she does not want to be admitted.  She has a young child she is cared for at home [JK]  1750 Abdominal wound recheck.  No recurrent bleeding [JK]    Clinical Course User Index [JK] Linwood Dibbles, MD   MDM Rules/Calculators/A&P                           Patient presented to the ED for evaluation of bleeding from her paracentesis site after she scratched a scab off.  Dressing was applied with a thrombin pad.  Patient has not had any bleeding since. sPatient's laboratory tests do show worsening bilirubin.  Her INR is also increased.  Her creatinine is also slightly increased.  Patient however feels that she is much better than when she was in the hospital.  She denies drinking any alcohol.  She is not having any abdominal pain or confusion.  Discussed possible admission to the hospital considering her worsening liver function test.  Patient states she has a small child that someone is watching right now.  She needs to go home to care for her child.  She does not want to be admitted but does agree to close follow-up.  Warning signs and precautions were discussed. Final Clinical Impression(s) / ED Diagnoses Final diagnoses:  Alcoholic cirrhosis of liver with ascites [facility])    Rx / DC Orders ED Discharge Orders     None        Linwood Dibbles, MD 07/07/21 Yvonna Alanis    Linwood Dibbles, MD 07/07/21 1753

## 2021-07-07 NOTE — ED Notes (Signed)
Called pt to be taken back to a room. No response.

## 2021-07-08 ENCOUNTER — Emergency Department (HOSPITAL_COMMUNITY)
Admission: EM | Admit: 2021-07-08 | Discharge: 2021-07-08 | Disposition: A | Discharge status: Home / Self Care | Payer: Medicaid Other | Attending: Student | Admitting: Student

## 2021-07-08 DIAGNOSIS — K7031 Alcoholic cirrhosis of liver with ascites: Secondary | ICD-10-CM

## 2021-07-08 DIAGNOSIS — L7622 Postprocedural hemorrhage and hematoma of skin and subcutaneous tissue following other procedure: Secondary | ICD-10-CM | POA: Diagnosis not present

## 2021-07-08 LAB — CBC WITH DIFFERENTIAL/PLATELET
Abs Immature Granulocytes: 0.2 10*3/uL — ABNORMAL HIGH (ref 0.00–0.07)
Basophils Absolute: 0.1 10*3/uL (ref 0.0–0.1)
Basophils Relative: 1 %
Eosinophils Absolute: 0.2 10*3/uL (ref 0.0–0.5)
Eosinophils Relative: 2 %
HCT: 24.9 % — ABNORMAL LOW (ref 36.0–46.0)
Hemoglobin: 7.8 g/dL — ABNORMAL LOW (ref 12.0–15.0)
Immature Granulocytes: 2 %
Lymphocytes Relative: 8 %
Lymphs Abs: 1 10*3/uL (ref 0.7–4.0)
MCH: 32.8 pg (ref 26.0–34.0)
MCHC: 31.3 g/dL (ref 30.0–36.0)
MCV: 104.6 fL — ABNORMAL HIGH (ref 80.0–100.0)
Monocytes Absolute: 1.5 10*3/uL — ABNORMAL HIGH (ref 0.1–1.0)
Monocytes Relative: 11 %
Neutro Abs: 9.9 10*3/uL — ABNORMAL HIGH (ref 1.7–7.7)
Neutrophils Relative %: 76 %
Platelets: 110 10*3/uL — ABNORMAL LOW (ref 150–400)
RBC: 2.38 MIL/uL — ABNORMAL LOW (ref 3.87–5.11)
RDW: 27.6 % — ABNORMAL HIGH (ref 11.5–15.5)
WBC: 12.9 10*3/uL — ABNORMAL HIGH (ref 4.0–10.5)
nRBC: 0 % (ref 0.0–0.2)

## 2021-07-08 LAB — COMPREHENSIVE METABOLIC PANEL
ALT: 40 U/L (ref 0–44)
AST: 93 U/L — ABNORMAL HIGH (ref 15–41)
Albumin: 1.5 g/dL — ABNORMAL LOW (ref 3.5–5.0)
Alkaline Phosphatase: 179 U/L — ABNORMAL HIGH (ref 38–126)
Anion gap: 7 (ref 5–15)
BUN: 14 mg/dL (ref 6–20)
CO2: 21 mmol/L — ABNORMAL LOW (ref 22–32)
Calcium: 8.2 mg/dL — ABNORMAL LOW (ref 8.9–10.3)
Chloride: 102 mmol/L (ref 98–111)
Creatinine, Ser: 0.83 mg/dL (ref 0.44–1.00)
GFR, Estimated: >60 mL/min (ref >60)
Glucose, Bld: 72 mg/dL (ref 70–99)
Potassium: 4.4 mmol/L (ref 3.5–5.1)
Sodium: 130 mmol/L — ABNORMAL LOW (ref 135–145)
Total Bilirubin: 17.1 mg/dL — ABNORMAL HIGH (ref 0.3–1.2)
Total Protein: 7.6 g/dL (ref 6.5–8.1)

## 2021-07-08 LAB — PROTIME-INR
INR: 2.6 — ABNORMAL HIGH (ref 0.8–1.2)
Prothrombin Time: 27.6 seconds — ABNORMAL HIGH (ref 11.4–15.2)

## 2021-07-08 MED ORDER — "THROMBI-PAD 3""X3"" EX PADS"
1.0000 | MEDICATED_PAD | Freq: Once | CUTANEOUS | Status: AC
  Administered 2021-07-08: 1 via TOPICAL
  Filled 2021-07-08: qty 1

## 2021-07-08 MED ORDER — SODIUM CHLORIDE 0.9 % IV BOLUS
500.0000 mL | Freq: Once | INTRAVENOUS | Status: AC
  Administered 2021-07-08: 500 mL via INTRAVENOUS

## 2021-07-08 NOTE — Discharge Instructions (Addendum)
Leave the thrombin pad on for 24 hours.  Around 1230 tomorrow you can remove it.  You should have formed a clot, if you start bleeding lightly recommend applying pressure to the area and see if he can form a clot on its own.  If you are unable to get the bleeding under control please return back to the ED.  If you develop a fever, weakness, confusion, chills, fatigue please return back to the ED as these are signs of complications from your procedure.  Please follow-up with GI as scheduled.

## 2021-07-08 NOTE — ED Provider Notes (Signed)
[facility]-EMERGENCY DEPT Provider Note   CSN: [id number] Arrival date & time: 07/08/21  5638     History Chief Complaint  Patient presents with   bleeding from paracentesis site    Michelle Little is a 35 y.o. female.  HPI  Patient presents with bleeding from paracentesis site.  Since seen yesterday in ED visit concerning, bleeding was controlled with thrombin pad and she was discharged home.  States that her pain started again this morning and she has been unable to stop it at home with pressure.  She was brought by EMS and her blood pressure was 86/60, it has improved while here.  She denies any associated symptoms.  She is not having any abdominal tenderness, lightheadedness, dizziness, pain, confusion, fever.  H/O of recent admission for decompensated alcoholic cirrhosis. Denies any recent alcohol consumption.   Past Medical History:  Diagnosis Date   Thyroid disease     Patient Active Problem List   Diagnosis Date Noted   Cirrhosis (HCC)    Anemia    Hepatic encephalopathy (HCC)    Acute renal failure (HCC)    Other ascites    Acute encephalopathy 06/17/2021    No past surgical history on file.   OB History   No obstetric history on file.     No family history on file.     Home Medications Prior to Admission medications   Medication Sig Start Date End Date Taking? Authorizing Provider  folic acid (FOLVITE) 1 MG tablet Take 1 tablet (1 mg total) by mouth daily. 06/30/21   Azucena Fallen, MD  furosemide (LASIX) 20 MG tablet Take 1 tablet (20 mg total) by mouth 2 (two) times daily. 06/30/21   Azucena Fallen, MD  lactulose (CEPHULAC) 10 g packet Take 1 packet (10 g total) by mouth 3 (three) times daily. 06/30/21   Azucena Fallen, MD  magnesium oxide (MAG-OX) 400 (240 Mg) MG tablet Take 2 tablets (800 mg total) by mouth 2 (two) times daily. 06/30/21 07/30/21  Azucena Fallen, MD  Multiple Vitamin (MULTIVITAMIN WITH MINERALS)  TABS tablet Take 1 tablet by mouth daily. 06/30/21   Azucena Fallen, MD  spironolactone (ALDACTONE) 100 MG tablet Take 1 tablet (100 mg total) by mouth daily. 06/30/21   Azucena Fallen, MD  thiamine 100 MG tablet Take 1 tablet (100 mg total) by mouth daily. 06/30/21   Azucena Fallen, MD    Allergies    Patient has no known allergies.  Review of Systems   Review of Systems  Constitutional:  Negative for fever.  Respiratory:  Negative for shortness of breath.   Cardiovascular:  Negative for chest pain.  Gastrointestinal:  Negative for abdominal pain, nausea and vomiting.  Skin:  Positive for wound.   Physical Exam Updated Vital Signs BP 110/75 (BP Location: Right Arm)   Pulse 93   Temp 98.5 F (36.9 C) (Oral)   Resp 16   SpO2 100%   Physical Exam Vitals and nursing note reviewed. Exam conducted with a chaperone present.  Constitutional:      General: She is not in acute distress.    Appearance: Normal appearance.  HENT:     Head: Normocephalic and atraumatic.  Eyes:     General: Scleral icterus present.     Extraocular Movements: Extraocular movements intact.     Pupils: Pupils are equal, round, and reactive to light.  Cardiovascular:     Rate and Rhythm: Normal rate and regular  rhythm.  Pulmonary:     Effort: Pulmonary effort is normal.     Breath sounds: Normal breath sounds.  Abdominal:     General: Abdomen is flat.     Comments: Ascites, but no tenderness.  Small wound from paracentesis with controlled bleeding.  No surrounding erythema, no fluctuance  Skin:    Coloration: Skin is not jaundiced.  Neurological:     Mental Status: She is alert. Mental status is at baseline.     Coordination: Coordination normal.    ED Results / Procedures / Treatments   Labs (all labs ordered are listed, but only abnormal results are displayed) Labs Reviewed  COMPREHENSIVE METABOLIC PANEL  CBC WITH DIFFERENTIAL/PLATELET  PROTIME-INR    EKG None  Radiology No  results found.  Procedures Procedures   Medications Ordered in ED Medications  sodium chloride 0.9 % bolus 500 mL (has no administration in time range)    ED Course  I have reviewed the triage vital signs and the nursing notes.  Pertinent labs & imaging results that were available during my care of the patient were reviewed by me and considered in my medical decision making (see chart for details).  Clinical Course as of 07/08/21 1444  Mon Jul 08, 2021  1159 Protime-INR(!) No changes in the last 24 hours. [HS]  1159 Comprehensive metabolic panel(!) Mild improvement from yesterday, stable. [HS]  1200 CBC with Differential(!) Hemoglobin stable, no acute changes from yesterday.  [HS]    Clinical Course User Index [HS] Theron Arista, PA-C   MDM Rules/Calculators/A&P                           Patient vitals are stable, she has some bleeding from the paracentesis site but there are no underlying signs of infection.  Labs were checked, are at baseline compared to yesterday.  No increase leukocytosis concerning for infection infectious etiology, PT/INR is roughly the same.  Her hemoglobin is stable.  Again reiterated with patient's the possibility of coming in for observation, patient is adamant she does not desire this.  She is not having any fevers or signs of underlying infection at this time, low suspicion for SBP.  Patient bleeding is now controlled with thrombin patch, she is not having any pain anywhere.  Additionally, her blood pressure has been stable while here in the ED.  No hypotension, no orthostatic vital sign changes.  Appropriate discharge at this time strict return precautions.  Final Clinical Impression(s) / ED Diagnoses Final diagnoses:  None    Rx / DC Orders ED Discharge Orders     None        Theron Arista, Cordelia Poche 07/08/21 1448    Kommor, Wyn Forster, MD 07/08/21 1622

## 2021-07-08 NOTE — ED Triage Notes (Signed)
Per EMS, pt reports bleeding from paracentesis site. BP 86/60 sitting w/ EMS. Last EMS BP 106/56.

## 2021-07-09 ENCOUNTER — Encounter (HOSPITAL_COMMUNITY): Payer: Self-pay

## 2021-07-09 ENCOUNTER — Other Ambulatory Visit: Payer: Self-pay

## 2021-07-09 ENCOUNTER — Emergency Department (HOSPITAL_COMMUNITY)
Admission: EM | Admit: 2021-07-09 | Discharge: 2021-07-09 | Disposition: A | Discharge status: Home / Self Care | Payer: Medicaid Other | Attending: Emergency Medicine | Admitting: Emergency Medicine

## 2021-07-09 DIAGNOSIS — L7622 Postprocedural hemorrhage and hematoma of skin and subcutaneous tissue following other procedure: Secondary | ICD-10-CM | POA: Insufficient documentation

## 2021-07-09 DIAGNOSIS — D649 Anemia, unspecified: Secondary | ICD-10-CM | POA: Diagnosis not present

## 2021-07-09 DIAGNOSIS — R58 Hemorrhage, not elsewhere classified: Secondary | ICD-10-CM

## 2021-07-09 LAB — CBC WITH DIFFERENTIAL/PLATELET
Abs Immature Granulocytes: 0.2 10*3/uL — ABNORMAL HIGH (ref 0.00–0.07)
Basophils Absolute: 0.1 10*3/uL (ref 0.0–0.1)
Basophils Relative: 1 %
Eosinophils Absolute: 0.2 10*3/uL (ref 0.0–0.5)
Eosinophils Relative: 2 %
HCT: 20.2 % — ABNORMAL LOW (ref 36.0–46.0)
Hemoglobin: 6.5 g/dL — CL (ref 12.0–15.0)
Immature Granulocytes: 2 %
Lymphocytes Relative: 9 %
Lymphs Abs: 1.1 10*3/uL (ref 0.7–4.0)
MCH: 33.7 pg (ref 26.0–34.0)
MCHC: 32.2 g/dL (ref 30.0–36.0)
MCV: 104.7 fL — ABNORMAL HIGH (ref 80.0–100.0)
Monocytes Absolute: 1.5 10*3/uL — ABNORMAL HIGH (ref 0.1–1.0)
Monocytes Relative: 11 %
Neutro Abs: 9.6 10*3/uL — ABNORMAL HIGH (ref 1.7–7.7)
Neutrophils Relative %: 75 %
Platelets: 103 10*3/uL — ABNORMAL LOW (ref 150–400)
RBC: 1.93 MIL/uL — ABNORMAL LOW (ref 3.87–5.11)
WBC: 12.7 10*3/uL — ABNORMAL HIGH (ref 4.0–10.5)
nRBC: 0.2 % (ref 0.0–0.2)

## 2021-07-09 LAB — TYPE AND SCREEN
ABO/RH(D): B POS
ABO/RH(D): B POS
Antibody Screen: NEGATIVE
Antibody Screen: NEGATIVE

## 2021-07-09 LAB — COMPREHENSIVE METABOLIC PANEL
ALT: 39 U/L (ref 0–44)
AST: 89 U/L — ABNORMAL HIGH (ref 15–41)
Albumin: 1.5 g/dL — ABNORMAL LOW (ref 3.5–5.0)
Alkaline Phosphatase: 158 U/L — ABNORMAL HIGH (ref 38–126)
Anion gap: 8 (ref 5–15)
BUN: 13 mg/dL (ref 6–20)
CO2: 19 mmol/L — ABNORMAL LOW (ref 22–32)
Calcium: 8.2 mg/dL — ABNORMAL LOW (ref 8.9–10.3)
Chloride: 100 mmol/L (ref 98–111)
Creatinine, Ser: 1.07 mg/dL — ABNORMAL HIGH (ref 0.44–1.00)
GFR, Estimated: >60 mL/min (ref >60)
Glucose, Bld: 85 mg/dL (ref 70–99)
Potassium: 4.2 mmol/L (ref 3.5–5.1)
Sodium: 127 mmol/L — ABNORMAL LOW (ref 135–145)
Total Bilirubin: 19 mg/dL (ref 0.3–1.2)
Total Protein: 7.7 g/dL (ref 6.5–8.1)

## 2021-07-09 LAB — PROTIME-INR
INR: 2.6 — ABNORMAL HIGH (ref 0.8–1.2)
Prothrombin Time: 27.7 seconds — ABNORMAL HIGH (ref 11.4–15.2)

## 2021-07-09 MED ORDER — LIDOCAINE-EPINEPHRINE (PF) 2 %-1:200000 IJ SOLN: 10.0000 mL | Freq: Once | INTRAMUSCULAR | Status: DC

## 2021-07-09 MED ORDER — LIDOCAINE HCL (PF) 1 % IJ SOLN
INTRAMUSCULAR | Status: AC
  Filled 2021-07-09: qty 5

## 2021-07-09 NOTE — Discharge Instructions (Addendum)
Please follow-up with your primary care provider in the next week or 2 to have your labs rechecked.  Please drink plenty of water.   As we discussed your hemoglobin was found to be low at 6.5 today.  Please return immediately to the ER for users any shortness of breath lightheadedness or passout.  You may also return for any other new or concerning symptoms.

## 2021-07-09 NOTE — ED Notes (Signed)
Pt alert, NAD, calm, interactive, resps e/u, speaking in clear complete sentences, VSS, feel cold, skin W&D. EDPA at [facility].

## 2021-07-09 NOTE — ED Provider Notes (Signed)
[facility] EMERGENCY DEPARTMENT Provider Note   CSN: [id number] Arrival date & time: 07/09/21  9892     History Chief Complaint  Patient presents with   Bleeding/Bruising    Michelle Little is a 35 y.o. female.  HPI Patient with persistent bleeding from right lower abdominal paracentesis site.  She picked this area a few days ago and has had bleeding since.  She was seen in the ER on the 14th and 15th of this month.  AKA yesterday and today before.  She states the bleeding stopped during both of these ER visits however restarted.  She denies any lightheadedness dizziness shortness of breath chest pain shortness of breath fatigue malaise.  She states she feels well would like to go home but she cannot get the bleeding stopped.   No other symptoms no associated symptoms.    Past Medical History:  Diagnosis Date   Thyroid disease     Patient Active Problem List   Diagnosis Date Noted   Cirrhosis (HCC)    Anemia    Hepatic encephalopathy (HCC)    Acute renal failure (HCC)    Other ascites    Acute encephalopathy 06/17/2021    History reviewed. No pertinent surgical history.   OB History   No obstetric history on file.     No family history on file.     Home Medications Prior to Admission medications   Medication Sig Start Date End Date Taking? Authorizing Provider  folic acid (FOLVITE) 1 MG tablet Take 1 tablet (1 mg total) by mouth daily. Patient not taking: No sig reported 06/30/21   Azucena Fallen, MD  furosemide (LASIX) 20 MG tablet Take 1 tablet (20 mg total) by mouth 2 (two) times daily. 06/30/21   Azucena Fallen, MD  lactulose (CEPHULAC) 10 g packet Take 1 packet (10 g total) by mouth 3 (three) times daily. 06/30/21   Azucena Fallen, MD  magnesium oxide (MAG-OX) 400 (240 Mg) MG tablet Take 2 tablets (800 mg total) by mouth 2 (two) times daily. 06/30/21 07/30/21  Azucena Fallen, MD  Multiple Vitamin (MULTIVITAMIN WITH  MINERALS) TABS tablet Take 1 tablet by mouth daily. 06/30/21   Azucena Fallen, MD  spironolactone (ALDACTONE) 100 MG tablet Take 1 tablet (100 mg total) by mouth daily. 06/30/21   Azucena Fallen, MD  thiamine 100 MG tablet Take 1 tablet (100 mg total) by mouth daily. Patient not taking: Reported on 07/08/2021 06/30/21   Azucena Fallen, MD    Allergies    Patient has no known allergies.  Review of Systems   Review of Systems  Constitutional:  Negative for chills and fever.  HENT:  Negative for congestion.   Eyes:  Negative for pain.  Respiratory:  Negative for cough and shortness of breath.   Cardiovascular:  Negative for chest pain and leg swelling.  Gastrointestinal:  Negative for abdominal pain and vomiting.  Genitourinary:  Negative for dysuria.  Musculoskeletal:  Negative for myalgias.  Skin:  Negative for rash.       Bleeding  Neurological:  Negative for dizziness and headaches.   Physical Exam Updated Vital Signs BP 111/81   Pulse 74   Temp 98 F (36.7 C) (Oral)   Resp 17   SpO2 100%   Physical Exam Vitals and nursing note reviewed.  Constitutional:      General: She is not in acute distress. HENT:     Head: Normocephalic and atraumatic.  Nose: Nose normal.  Eyes:     General: Scleral icterus present.  Cardiovascular:     Rate and Rhythm: Normal rate and regular rhythm.     Pulses: Normal pulses.     Heart sounds: Normal heart sounds.  Pulmonary:     Effort: Pulmonary effort is normal. No respiratory distress.     Breath sounds: No wheezing.  Abdominal:     Palpations: Abdomen is soft.     Tenderness: There is no abdominal tenderness.  Musculoskeletal:     Cervical back: Normal range of motion.     Right lower leg: No edema.     Left lower leg: No edema.  Skin:    General: Skin is warm and dry.     Capillary Refill: Capillary refill takes less than 2 seconds.     Comments: Jaundice skin diffusely especially on the palms.  Right lower  abdomen with small puncture site there is a papule like formation of skin there is a pinpoint area of bleeding here if it is consistently slowly oozing blood.  Neurological:     Mental Status: She is alert. Mental status is at baseline.  Psychiatric:        Mood and Affect: Mood normal.        Behavior: Behavior normal.    ED Results / Procedures / Treatments   Labs (all labs ordered are listed, but only abnormal results are displayed) Labs Reviewed  CBC WITH DIFFERENTIAL/PLATELET - Abnormal; Notable for the following components:      Result Value   WBC 12.7 (*)    RBC 1.93 (*)    Hemoglobin 6.5 (*)    HCT 20.2 (*)    MCV 104.7 (*)    Platelets 103 (*)    Neutro Abs 9.6 (*)    Monocytes Absolute 1.5 (*)    Abs Immature Granulocytes 0.20 (*)    All other components within normal limits  COMPREHENSIVE METABOLIC PANEL - Abnormal; Notable for the following components:   Sodium 127 (*)    CO2 19 (*)    Creatinine, Ser 1.07 (*)    Calcium 8.2 (*)    Albumin 1.5 (*)    AST 89 (*)    Alkaline Phosphatase 158 (*)    Total Bilirubin 19.0 (*)    All other components within normal limits  PROTIME-INR - Abnormal; Notable for the following components:   Prothrombin Time 27.7 (*)    INR 2.6 (*)    All other components within normal limits  TYPE AND SCREEN    EKG None  Radiology No results found.  Procedures .Marland KitchenLaceration Repair  Date/Time: 07/10/2021 6:50 PM Performed by: Gailen Shelter, PA Authorized by: Gailen Shelter, PA   Consent:    Consent obtained:  Verbal   Consent given by:  Patient   Risks discussed:  Infection, need for additional repair, pain, poor cosmetic result and poor wound healing   Alternatives discussed:  No treatment and delayed treatment Universal protocol:    Procedure explained and questions answered to patient or proxy's satisfaction: yes     Relevant documents present and verified: yes     Test results available: yes     Imaging studies  available: yes     Required blood products, implants, devices, and special equipment available: yes     Site/side marked: yes     Immediately prior to procedure, a time out was called: yes     Patient identity confirmed:  Verbally with  patient Anesthesia:    Anesthesia method:  Local infiltration   Local anesthetic:  Bupivacaine 0.5% w/o epi Laceration details:    Location: abdomen.   Length (cm):  0.1 Exploration:    Hemostasis achieved with:  Direct pressure   Wound extent: no foreign bodies/material noted and no tendon damage noted     Contaminated: no   Skin repair:    Repair method:  Sutures   Suture size:  4-0   Suture material:  Prolene   Suture technique:  Figure eight   Number of sutures:  1 Approximation:    Approximation:  Close Repair type:    Repair type:  Simple Post-procedure details:    Procedure completion:  Tolerated well, no immediate complications   Medications Ordered in ED Medications  lidocaine (PF) (XYLOCAINE) 1 % injection (  Given by Other 07/09/21 1404)    ED Course  I have reviewed the triage vital signs and the nursing notes.  Pertinent labs & imaging results that were available during my care of the patient were reviewed by me and considered in my medical decision making (see chart for details).    MDM Rules/Calculators/A&P                           patient is a liver failure patient who is experiencing continued bleeding from paracentesis site.  She has been seen in the last 2 days once per day in the ER bleeding was stopped with topical treatment such as quick clot.  My evaluation she does have continued bleeding from the paracentesis site.  I used a single figure-of-eight stitch to close the area that is exsanguinating.  The bleeding stopped completely.  Notably her hemoglobin is low at 6.5 offered admission for observation and after shared decision-making irritation she states that she prefer to go home.  I think this is reasonable.   Discussed with my attending physician Dr. Anitra Lauth.  Patient has no symptoms of anemia.  CMP consistent with patient's known liver disease with bilirubin of 19.  She will follow-up with gastroenterology for this.  Patient tolerated the procedure well.  Given very strict return precautions.  At time of discharge her blood pressure is within normal limits, heart rates within normal limits and she is well-appearing and asymptomatic.  Again offered patient admission which she declined.  Final Clinical Impression(s) / ED Diagnoses Final diagnoses:  Bleeding  Anemia, unspecified type    Rx / DC Orders ED Discharge Orders     None        Gailen Shelter, [place] 07/10/21 1853    Gwyneth Sprout, MD 07/13/21 [phone]

## 2021-07-09 NOTE — ED Triage Notes (Signed)
Pt reports bleeding from paracentesis site since 4pm yesterday. Moderate amount of blood on bandage, new bandage applied in triage.  98/62 with ems 100% room air.

## 2021-07-09 NOTE — ED Provider Notes (Signed)
Emergency Medicine Provider Triage Evaluation Note  Sheyanne Munley , a 35 y.o. female  was evaluated in triage.  Pt complains of bleeding from paracentesis she had this paracentesis done while she was in the hospital this was done approximately 2 or 3 weeks ago.  She states that she picked the scab off a few days ago and has had some intermittent oozing of fluid which is red and bloody appearing since that time.  She has been seen in the ER since had thrombin applied with good bleeding control.  She denies any confusion lightheadedness or dizziness..  Review of Systems  Positive: Bleeding Negative: Fever  Physical Exam  BP (!) 122/93 (BP Location: Right Arm)   Pulse (!) 109   Temp 98.2 F (36.8 C)   Resp 16   SpO2 100%  Gen:   Awake, no distress   Resp:  Normal effort  MSK:   Moves extremities without difficulty  Other:  Protuberant abdomen with scant blood study sanguinous fluid paracentesis site.  Scleral icterus.  Medical Decision Making  Medically screening exam initiated at 9:43 AM.  Appropriate orders placed.  Macie Heideman was informed that the remainder of the evaluation will be completed by another provider, this initial triage assessment does not replace that evaluation, and the importance of remaining in the ED until their evaluation is complete.  Patient with anemia appears chronic in nature.  With active albeit slow bleeding we will check the CBC coags and CMP    Gailen Shelter, [place] 07/09/21 1601    Gwyneth Sprout, MD 07/09/21 1436

## 2021-07-09 NOTE — ED Notes (Signed)
Up to b/r at time of d/c

## 2021-07-09 NOTE — ED Notes (Addendum)
EDPA at [facility] suturing abd wound/ paracentesis site.

## 2021-07-11 ENCOUNTER — Other Ambulatory Visit: Payer: Self-pay

## 2021-07-11 ENCOUNTER — Inpatient Hospital Stay (HOSPITAL_COMMUNITY)
Admission: EM | Admit: 2021-07-11 | Discharge: 2021-07-26 | DRG: 908 | Discharge status: Against Medical Advice | Payer: Medicaid Other | Attending: Family Medicine | Admitting: Family Medicine

## 2021-07-11 ENCOUNTER — Telehealth: Payer: Self-pay | Admitting: *Deleted

## 2021-07-11 DIAGNOSIS — K766 Portal hypertension: Secondary | ICD-10-CM | POA: Diagnosis present

## 2021-07-11 DIAGNOSIS — E039 Hypothyroidism, unspecified: Secondary | ICD-10-CM | POA: Diagnosis present

## 2021-07-11 DIAGNOSIS — F1010 Alcohol abuse, uncomplicated: Secondary | ICD-10-CM | POA: Diagnosis present

## 2021-07-11 DIAGNOSIS — E871 Hypo-osmolality and hyponatremia: Secondary | ICD-10-CM | POA: Diagnosis present

## 2021-07-11 DIAGNOSIS — Z20822 Contact with and (suspected) exposure to covid-19: Secondary | ICD-10-CM | POA: Diagnosis present

## 2021-07-11 DIAGNOSIS — D684 Acquired coagulation factor deficiency: Secondary | ICD-10-CM | POA: Diagnosis present

## 2021-07-11 DIAGNOSIS — D649 Anemia, unspecified: Secondary | ICD-10-CM | POA: Diagnosis present

## 2021-07-11 DIAGNOSIS — F419 Anxiety disorder, unspecified: Secondary | ICD-10-CM | POA: Diagnosis present

## 2021-07-11 DIAGNOSIS — Z79899 Other long term (current) drug therapy: Secondary | ICD-10-CM

## 2021-07-11 DIAGNOSIS — T8130XA Disruption of wound, unspecified, initial encounter: Principal | ICD-10-CM | POA: Diagnosis present

## 2021-07-11 DIAGNOSIS — K7290 Hepatic failure, unspecified without coma: Secondary | ICD-10-CM | POA: Diagnosis present

## 2021-07-11 DIAGNOSIS — N179 Acute kidney failure, unspecified: Secondary | ICD-10-CM | POA: Diagnosis present

## 2021-07-11 DIAGNOSIS — G43909 Migraine, unspecified, not intractable, without status migrainosus: Secondary | ICD-10-CM | POA: Diagnosis present

## 2021-07-11 DIAGNOSIS — R197 Diarrhea, unspecified: Secondary | ICD-10-CM | POA: Diagnosis present

## 2021-07-11 DIAGNOSIS — Z5901 Sheltered homelessness: Secondary | ICD-10-CM

## 2021-07-11 DIAGNOSIS — K7031 Alcoholic cirrhosis of liver with ascites: Secondary | ICD-10-CM | POA: Diagnosis present

## 2021-07-11 DIAGNOSIS — R188 Other ascites: Secondary | ICD-10-CM

## 2021-07-11 DIAGNOSIS — R7989 Other specified abnormal findings of blood chemistry: Secondary | ICD-10-CM | POA: Diagnosis present

## 2021-07-11 DIAGNOSIS — D62 Acute posthemorrhagic anemia: Secondary | ICD-10-CM | POA: Diagnosis present

## 2021-07-11 DIAGNOSIS — K7682 Hepatic encephalopathy: Secondary | ICD-10-CM

## 2021-07-11 DIAGNOSIS — Y848 Other medical procedures as the cause of abnormal reaction of the patient, or of later complication, without mention of misadventure at the time of the procedure: Secondary | ICD-10-CM | POA: Diagnosis present

## 2021-07-11 DIAGNOSIS — D6959 Other secondary thrombocytopenia: Secondary | ICD-10-CM | POA: Diagnosis present

## 2021-07-11 DIAGNOSIS — E8770 Fluid overload, unspecified: Secondary | ICD-10-CM | POA: Diagnosis present

## 2021-07-11 DIAGNOSIS — K7011 Alcoholic hepatitis with ascites: Secondary | ICD-10-CM | POA: Diagnosis present

## 2021-07-11 DIAGNOSIS — K7460 Unspecified cirrhosis of liver: Secondary | ICD-10-CM | POA: Diagnosis present

## 2021-07-11 HISTORY — DX: Abnormal coagulation profile: R79.1

## 2021-07-11 HISTORY — DX: Alcoholic cirrhosis of liver without ascites: K70.30

## 2021-07-11 LAB — COMPREHENSIVE METABOLIC PANEL
ALT: 40 U/L (ref 0–44)
AST: 94 U/L — ABNORMAL HIGH (ref 15–41)
Albumin: 1.5 g/dL — ABNORMAL LOW (ref 3.5–5.0)
Alkaline Phosphatase: 155 U/L — ABNORMAL HIGH (ref 38–126)
Anion gap: 6 (ref 5–15)
BUN: 13 mg/dL (ref 6–20)
CO2: 20 mmol/L — ABNORMAL LOW (ref 22–32)
Calcium: 7.5 mg/dL — ABNORMAL LOW (ref 8.9–10.3)
Chloride: 102 mmol/L (ref 98–111)
Creatinine, Ser: 1.14 mg/dL — ABNORMAL HIGH (ref 0.44–1.00)
GFR, Estimated: >60 mL/min (ref >60)
Glucose, Bld: 106 mg/dL — ABNORMAL HIGH (ref 70–99)
Potassium: 4.5 mmol/L (ref 3.5–5.1)
Sodium: 128 mmol/L — ABNORMAL LOW (ref 135–145)
Total Bilirubin: 18.1 mg/dL (ref 0.3–1.2)
Total Protein: 7.7 g/dL (ref 6.5–8.1)

## 2021-07-11 LAB — PROTIME-INR
INR: 2.5 — ABNORMAL HIGH (ref 0.8–1.2)
Prothrombin Time: 27.3 seconds — ABNORMAL HIGH (ref 11.4–15.2)

## 2021-07-11 LAB — CBC WITH DIFFERENTIAL/PLATELET
Abs Immature Granulocytes: 0.31 10*3/uL — ABNORMAL HIGH (ref 0.00–0.07)
Basophils Absolute: 0.1 10*3/uL (ref 0.0–0.1)
Basophils Relative: 1 %
Eosinophils Absolute: 0.1 10*3/uL (ref 0.0–0.5)
Eosinophils Relative: 1 %
HCT: 19.7 % — ABNORMAL LOW (ref 36.0–46.0)
Hemoglobin: 6.2 g/dL — CL (ref 12.0–15.0)
Immature Granulocytes: 2 %
Lymphocytes Relative: 7 %
Lymphs Abs: 1 10*3/uL (ref 0.7–4.0)
MCH: 34.1 pg — ABNORMAL HIGH (ref 26.0–34.0)
MCHC: 31.5 g/dL (ref 30.0–36.0)
MCV: 108.2 fL — ABNORMAL HIGH (ref 80.0–100.0)
Monocytes Absolute: 1.5 10*3/uL — ABNORMAL HIGH (ref 0.1–1.0)
Monocytes Relative: 11 %
Neutro Abs: 11.3 10*3/uL — ABNORMAL HIGH (ref 1.7–7.7)
Neutrophils Relative %: 78 %
Platelets: 107 10*3/uL — ABNORMAL LOW (ref 150–400)
RBC: 1.82 MIL/uL — ABNORMAL LOW (ref 3.87–5.11)
Smear Review: DECREASED
WBC: 14.3 10*3/uL — ABNORMAL HIGH (ref 4.0–10.5)
nRBC: 0.4 % — ABNORMAL HIGH (ref 0.0–0.2)

## 2021-07-11 LAB — PREPARE RBC (CROSSMATCH)

## 2021-07-11 LAB — APTT: aPTT: 54 seconds — ABNORMAL HIGH (ref 24–36)

## 2021-07-11 MED ORDER — SODIUM CHLORIDE 0.9 % IV SOLN: 10.0000 mL/h | Freq: Once | INTRAVENOUS | Status: DC

## 2021-07-11 NOTE — ED Notes (Signed)
Pt stepped outside.  

## 2021-07-11 NOTE — ED Provider Notes (Signed)
[facility] EMERGENCY DEPARTMENT Provider Note   CSN: [id number] Arrival date & time: 07/11/21  1659     History Chief Complaint  Patient presents with   Post-op Problem    Michelle Little is a 35 y.o. female.  HPI  35 year old female with a past medical history of alcoholic cirrhosis complicated by acute blood loss anemia and hepatic cephalopathy in the past presenting to the emergency department with fatigue and persistent bleeding from the paracentesis site.  Patient reports that she had a paracentesis done when she was hospitalized here several weeks ago.  She states that she accidentally picked a scab on it, and since that time has had persistent bleeding.  She has been in the emergency department twice for this and had a topical coagulant applied, followed by suture repair but she states that today began spontaneously bleeding again.  She denies any repeat trauma.  She denies any abdominal pain.  She denies any melena, hematochezia, hematemesis.  She denies any fevers or chills.  She endorses positional lightheadedness and generalized fatigue.  Past Medical History:  Diagnosis Date   Thyroid disease     Patient Active Problem List   Diagnosis Date Noted   Acute blood loss anemia 07/11/2021   Cirrhosis (HCC)    Anemia    Hepatic encephalopathy (HCC)    Acute renal failure (HCC)    Other ascites    Acute encephalopathy 06/17/2021    No past surgical history on file.   OB History   No obstetric history on file.     No family history on file.     Home Medications Prior to Admission medications   Medication Sig Start Date End Date Taking? Authorizing Provider  folic acid (FOLVITE) 1 MG tablet Take 1 tablet (1 mg total) by mouth daily. 06/30/21  Yes Azucena Fallen, MD  furosemide (LASIX) 20 MG tablet Take 1 tablet (20 mg total) by mouth 2 (two) times daily. 06/30/21  Yes Azucena Fallen, MD  lactulose (CEPHULAC) 10 g packet Take 1 packet (10  g total) by mouth 3 (three) times daily. 06/30/21  Yes Azucena Fallen, MD  magnesium oxide (MAG-OX) 400 (240 Mg) MG tablet Take 2 tablets (800 mg total) by mouth 2 (two) times daily. 06/30/21 07/30/21 Yes Azucena Fallen, MD  Multiple Vitamin (MULTIVITAMIN WITH MINERALS) TABS tablet Take 1 tablet by mouth daily. 06/30/21  Yes Azucena Fallen, MD  spironolactone (ALDACTONE) 100 MG tablet Take 1 tablet (100 mg total) by mouth daily. 06/30/21  Yes Azucena Fallen, MD  thiamine 100 MG tablet Take 1 tablet (100 mg total) by mouth daily. 06/30/21  Yes Azucena Fallen, MD    Allergies    Patient has no known allergies.  Review of Systems   Review of Systems  Constitutional:  Negative for chills and fever.  HENT:  Negative for ear pain and sore throat.   Eyes:  Negative for pain and visual disturbance.  Respiratory:  Negative for cough and shortness of breath.   Cardiovascular:  Negative for chest pain and palpitations.  Gastrointestinal:  Negative for abdominal pain and vomiting.  Genitourinary:  Negative for dysuria and hematuria.  Musculoskeletal:  Negative for arthralgias and back pain.  Skin:  Positive for wound. Negative for color change and rash.  Neurological:  Negative for seizures and syncope.  All other systems reviewed and are negative.  Physical Exam Updated Vital Signs BP (!) 107/57   Pulse 100  Temp 98.8 F (37.1 C) (Oral)   Resp 16   Ht 5\' 4"  (1.626 m)   Wt 62.9 kg   SpO2 100%   BMI 23.80 kg/m   Physical Exam Vitals and nursing note reviewed.  Constitutional:      General: She is not in acute distress.    Appearance: She is well-developed. She is not toxic-appearing.  HENT:     Head: Normocephalic.  Eyes:     General: Scleral icterus present.     Conjunctiva/sclera: Conjunctivae normal.  Cardiovascular:     Rate and Rhythm: Normal rate and regular rhythm.     Heart sounds: No murmur heard. Pulmonary:     Effort: Pulmonary effort is normal. No  respiratory distress.     Breath sounds: Normal breath sounds.  Abdominal:     General: There is distension.     Palpations: Abdomen is soft.     Tenderness: There is no abdominal tenderness. There is no guarding or rebound.     Comments: Puncture site actively bleeding.  After 15 minutes of direct pressure, still remains actively oozing.  No pulsatile bleeding.  No surrounding erythema or tenderness.  Musculoskeletal:     Cervical back: Normal range of motion and neck supple. No rigidity or tenderness.     Right lower leg: Edema present.     Left lower leg: Edema present.  Skin:    General: Skin is warm and dry.     Capillary Refill: Capillary refill takes less than 2 seconds.  Neurological:     Mental Status: She is alert and oriented to person, place, and time.    ED Results / Procedures / Treatments   Labs (all labs ordered are listed, but only abnormal results are displayed) Labs Reviewed  CBC WITH DIFFERENTIAL/PLATELET - Abnormal; Notable for the following components:      Result Value   WBC 14.3 (*)    RBC 1.82 (*)    Hemoglobin 6.2 (*)    HCT 19.7 (*)    MCV 108.2 (*)    MCH 34.1 (*)    Platelets 107 (*)    nRBC 0.4 (*)    Neutro Abs 11.3 (*)    Monocytes Absolute 1.5 (*)    Abs Immature Granulocytes 0.31 (*)    All other components within normal limits  COMPREHENSIVE METABOLIC PANEL - Abnormal; Notable for the following components:   Sodium 128 (*)    CO2 20 (*)    Glucose, Bld 106 (*)    Creatinine, Ser 1.14 (*)    Calcium 7.5 (*)    Albumin 1.5 (*)    AST 94 (*)    Alkaline Phosphatase 155 (*)    Total Bilirubin 18.1 (*)    All other components within normal limits  PROTIME-INR - Abnormal; Notable for the following components:   Prothrombin Time 27.3 (*)    INR 2.5 (*)    All other components within normal limits  APTT - Abnormal; Notable for the following components:   aPTT 54 (*)    All other components within normal limits  PATHOLOGIST SMEAR REVIEW   TYPE AND SCREEN  PREPARE RBC (CROSSMATCH)    EKG None  Radiology No results found.  Procedures Wound repair  Date/Time: 07/12/2021 12:08 AM Performed by: 07/14/2021, MD Authorized by: Lenard Lance, MD  Consent: Verbal consent obtained. Risks and benefits: risks, benefits and alternatives were discussed Consent given by: patient Patient identity confirmed: verbally with patient Time out: Immediately prior  to procedure a "time out" was called to verify the correct patient, procedure, equipment, support staff and site/side marked as required. Preparation: Patient was prepped and draped in the usual sterile fashion. Local anesthesia used: yes Anesthesia: local infiltration  Anesthesia: Local anesthesia used: yes Local Anesthetic: lidocaine 2% with epinephrine Patient tolerance: patient tolerated the procedure well with no immediate complications Comments: Previous suture removed.  Due to persistent oozing from this puncture site, 4-0 Ethilon used in a pursestring fashion with sufficient hemostasis achieved.  Patient stated that she was not satisfied with the cosmesis from the pursestring repair, therefore a single simple interrupted suture was used to close the skin.  Note is made that the 4-0 Ethilon suture broke near the knot of the pursestring suture and therefore may loosen spontaneously.  I warned the patient about this and she did not want me to redo the repair.     Medications Ordered in ED Medications  0.9 %  sodium chloride infusion (has no administration in time range)    ED Course  I have reviewed the triage vital signs and the nursing notes.  Pertinent labs & imaging results that were available during my care of the patient were reviewed by me and considered in my medical decision making (see chart for details).    MDM Rules/Calculators/A&P                           35 year old female with alcoholic cirrhosis presenting with persistent bleeding from  previous paracentesis site.  Vital signs reviewed, within acceptable limits.  She is slightly tachycardic on arrival.  Physical exam is notable for a jaundiced appearing female with palpable ascites and bilateral lower extremity edema.  Blood work obtained in triage, notable for hemoglobin of 6.2, downtrending from 6.5 last measure.  She has an elevated INR of 2.5.  She has a significant hyperbilirubinemia around 18.  Overall, her INR and liver measures are roughly stable from last time she is here in the emergency department.  Her hemoglobin continues to downtrend, this is the third time she has been here for this persistent bleeding.  She states that at this point, she wishes to receive a blood transfusion and be admitted to the hospital although she did not want to in the past.  Given that her hemoglobin is less than 7 and she seems to be symptomatic from this, both given her tachycardia, positional lightheadedness, and fatigue I believe that this is indicated.  Will transfuse and admit her to the hospital.  I repaired the puncture site as detailed above. Final Clinical Impression(s) / ED Diagnoses Final diagnoses:  Wound dehiscence    Rx / DC Orders ED Discharge Orders     None        Lenard Lance, MD 07/12/21 0010    Rolan Bucco, MD 07/12/21 [phone]

## 2021-07-11 NOTE — Telephone Encounter (Signed)
Patient referral has been faxed to [facility]

## 2021-07-11 NOTE — ED Notes (Signed)
Pt refused to be on cardiac monitor at this time

## 2021-07-11 NOTE — ED Provider Notes (Signed)
Emergency Medicine Provider Triage Evaluation Note  Michelle Little , a 35 y.o. female  was evaluated in triage.  Pt complains of bleeding from paracentesis site.  Patient reports that paracentesis occurred 2-3 weeks prior.  Patient has had intermittent bleeding since then.  Patient has been Emergency Department multiple times for this complaint.  Most recently patient was seen on 8/16 and had a figure-of-eight stitch placed which controlled bleeding.  Patient states that bleeding was well controlled until earlier today.  Bleeding started spontaneously without any aggravating incident.  Patient reports that bleeding is "a constant flow."  Patient denies any lightheadedness, dizziness, syncope, chest pain, shortness of breath, fatigue, abdominal pain.  Review of Systems  Positive: Bleeding Negative: lightheadedness, dizziness, syncope, chest pain, shortness of breath, fatigue, abdominal pain  Physical Exam  BP (!) 98/53 (BP Location: Left Arm)   Pulse (!) 107   Resp 16   SpO2 100%  Gen:   Awake, no distress   Resp:  Normal effort  MSK:   Moves extremities without difficulty  Other:  Patient has abdominal padding gauze right lower quadrant abdomen.  Medical Decision Making  Medically screening exam initiated at 5:06 PM.  Appropriate orders placed.  [name] was informed that the remainder of the evaluation will be completed by another provider, this initial triage assessment does not replace that evaluation, and the importance of remaining in the ED until their evaluation is complete.  EMS reports that patient had bleeding upon their arrival.  Gauze and bandaging placed by EMS with reported improvement in bleeding.  The patient appears stable so that the remainder of the work up may be completed by another provider.      Haskel Schroeder, PA-C 07/11/21 1711    Franne Forts, DO 07/12/21 0020

## 2021-07-11 NOTE — Telephone Encounter (Signed)
Patient stated that she doesn't have the 180 dollars to pay regarding an office visit because she doesn't have the money and that she jsut moved her and doesn't have a car either and she would perfer to go the [place] location because it [place] closer to [place]. She said that when she gets the money she would call us back regarding setting up an appointment.

## 2021-07-11 NOTE — ED Notes (Addendum)
As I was hooking PT up to the monitor in the room, PT began stating to me that she was paranoid that the police and everyone were after her. I informed her that she was in a safe place. PT asked me if the police were after her. PT asked me to leave her door open as well so that she could see outside of her room.

## 2021-07-11 NOTE — ED Notes (Signed)
Received pt from lobby at this time, ambulatory with steady gait. 

## 2021-07-11 NOTE — ED Notes (Signed)
Keypad not working at this time / not able to reach patient. Pt gave verbal consent for blood transfusion with this RN as witness.

## 2021-07-11 NOTE — ED Triage Notes (Signed)
Pt via EMS for eval of bleeding from paracentesis site. EMS reports continuous oozing of blood from site. Denies pain.

## 2021-07-12 ENCOUNTER — Encounter (HOSPITAL_COMMUNITY): Payer: Self-pay | Admitting: Internal Medicine

## 2021-07-12 DIAGNOSIS — D62 Acute posthemorrhagic anemia: Secondary | ICD-10-CM

## 2021-07-12 LAB — CBC
HCT: 19.2 % — ABNORMAL LOW (ref 36.0–46.0)
Hemoglobin: 6.2 g/dL — CL (ref 12.0–15.0)
MCH: 32.8 pg (ref 26.0–34.0)
MCHC: 32.3 g/dL (ref 30.0–36.0)
MCV: 101.6 fL — ABNORMAL HIGH (ref 80.0–100.0)
Platelets: 76 10*3/uL — ABNORMAL LOW (ref 150–400)
RBC: 1.89 MIL/uL — ABNORMAL LOW (ref 3.87–5.11)
WBC: 13.5 10*3/uL — ABNORMAL HIGH (ref 4.0–10.5)
nRBC: 0 % (ref 0.0–0.2)

## 2021-07-12 LAB — MRSA NEXT GEN BY PCR, NASAL: MRSA by PCR Next Gen: NOT DETECTED

## 2021-07-12 LAB — SARS CORONAVIRUS 2 (TAT 6-24 HRS): SARS Coronavirus 2: NEGATIVE

## 2021-07-12 LAB — AMMONIA: Ammonia: 81 umol/L — ABNORMAL HIGH (ref 9–35)

## 2021-07-12 MED ORDER — MAGNESIUM OXIDE -MG SUPPLEMENT 400 (240 MG) MG PO TABS
800.0000 mg | ORAL_TABLET | Freq: Two times a day (BID) | ORAL | Status: DC
  Administered 2021-07-12 – 2021-07-26 (×29): 800 mg via ORAL
  Filled 2021-07-12 (×29): qty 2

## 2021-07-12 MED ORDER — ALBUMIN HUMAN 25 % IV SOLN
25.0000 g | Freq: Four times a day (QID) | INTRAVENOUS | Status: AC
  Administered 2021-07-12: 25 g via INTRAVENOUS
  Filled 2021-07-12 (×2): qty 100

## 2021-07-12 MED ORDER — LACTULOSE 10 GM/15ML PO SOLN
10.0000 g | Freq: Two times a day (BID) | ORAL | Status: DC
  Administered 2021-07-12: 10 g via ORAL
  Filled 2021-07-12 (×2): qty 15

## 2021-07-12 MED ORDER — ADULT MULTIVITAMIN W/MINERALS CH
1.0000 | ORAL_TABLET | Freq: Every day | ORAL | Status: DC
  Administered 2021-07-12 – 2021-07-26 (×14): 1 via ORAL
  Filled 2021-07-12 (×15): qty 1

## 2021-07-12 MED ORDER — VITAMIN K1 10 MG/ML IJ SOLN
5.0000 mg | Freq: Once | INTRAVENOUS | Status: AC
  Administered 2021-07-12: 5 mg via INTRAVENOUS
  Filled 2021-07-12: qty 0.5

## 2021-07-12 MED ORDER — VITAMIN K1 10 MG/ML IJ SOLN
5.0000 mg | Freq: Every day | INTRAVENOUS | Status: DC
  Administered 2021-07-12: 5 mg via INTRAVENOUS
  Filled 2021-07-12 (×2): qty 0.5

## 2021-07-12 MED ORDER — LORAZEPAM 2 MG/ML IJ SOLN
0.2500 mg | Freq: Once | INTRAMUSCULAR | Status: AC
  Administered 2021-07-12: 0.25 mg via INTRAVENOUS
  Filled 2021-07-12: qty 1

## 2021-07-12 MED ORDER — THIAMINE HCL 100 MG PO TABS
100.0000 mg | ORAL_TABLET | Freq: Every day | ORAL | Status: DC
  Administered 2021-07-12 – 2021-07-26 (×14): 100 mg via ORAL
  Filled 2021-07-12 (×14): qty 1

## 2021-07-12 MED ORDER — LACTULOSE 10 GM/15ML PO SOLN: 10.0000 g | Freq: Three times a day (TID) | ORAL | Status: DC

## 2021-07-12 MED ORDER — PHENOL 1.4 % MT LIQD
1.0000 | OROMUCOSAL | Status: DC | PRN
Start: 2021-07-12 — End: 2021-07-27
  Administered 2021-07-12: 1 via OROMUCOSAL
  Filled 2021-07-12: qty 177

## 2021-07-12 MED ORDER — FOLIC ACID 1 MG PO TABS
1.0000 mg | ORAL_TABLET | Freq: Every day | ORAL | Status: DC
  Administered 2021-07-12 – 2021-07-26 (×14): 1 mg via ORAL
  Filled 2021-07-12 (×14): qty 1

## 2021-07-12 NOTE — Consult Note (Addendum)
[facility] Consult: 2:18 PM 07/12/2021  LOS: 1 day    Referring Provider: Dr Lonny Prude  Primary Care Physician:  Pcp, No Primary Gastroenterologist: Althia Forts patient.   Reason for Consultation: Bleeding at site of paracentesis.   HPI: Michelle Little is a 35 y.o. female.  Patient is homeless.  History of alcoholism.  Migraines.  Anxiety.  Hypothyroidism.  Severe hyperemesis gravidarum.  Uterine fibroid.  Iron deficiency anemia in 2018 with pregnancy  Advised to stop drinking by doctors in [place] when she complained of anorexia, nausea and vomiting.  H. pylori breath test negative, HCV antibody negative, hep B surface antigen negative in 10/2017  Evaluated as inpatient by GI during 7/25 - 06/30/21 admission regarding jaundice, lower extremity edema.  She had been actively drinking up until a few days prior to presenting to the hospital. Ultimately diagnosed with decompensated liver disease, combination of cirrhosis and alcoholic hepatitis.  Acute hepatitis serologies negative.  100 cc paracentesis with studies negative for SBP.  AKI vs HRS with urine sodium less than 10.  Treated with albumin infusions.  Renal function normalized completely.  Received 5 PRBCs for Hgb nadir 1.8 it was 7.5 at discharge and she was started on folic acid for lab confirmed folate deficiency.  Thrombocytopenia improved during admission.  INR was 2.1.  Started on lactulose and later on rifaximin for altered mental status presumed hepatic encephalopathy as she had asterixis.  Other issues included hypokalemia, hyponatremia, community-acquired pneumonia treated with doxycycline. Medications at discharge included furosemide 20 mg daily.  Spironolactone 100 mg daily.  Lactulose 3 times daily.  Came back to the ED on 8/14 to address bleeding  that started from the site of previous paracentesis.  She had scratched it this and it started bleeding. Hgb was 7.9, INR 2.5.  Treated with a thrombin pad and discharged.  Came back to the ED 8/15, 8/16 for ongoing bleeding from the same site.  On 8/17 the bleeding site was stitched. Return to the ED yesterday evening after recurrence of bleeding at the same site.  Not complaining of lightheadedness, dizziness, shortness of breath, abdominal pain, fatigue. Hgb 6.5.  Platelets 103.  WBCs 14.3.  INR 2.5.  Hospitalist elected to admit the patient.  Completed a unit of PRBCs at 2 AM today.  Despite her transfusion her Hb is down to 6.2.  Patient has never had endoscopy in [place]. Denies  n/v.  Stools are brown.  Describes the discharge from her abdomen as watery pink/bloody.  Lower extremity edema is improved.  No dyspnea.  No NSAIDs  Says she has not consumed any alcohol since her discharge earlier this month.  Living in a shelter with her significant other and her 93-year-old daughter.      Past Medical History:  Diagnosis Date   Thyroid disease     History reviewed. No pertinent surgical history.  Prior to Admission medications   Medication Sig Start Date End Date Taking? Authorizing Provider  folic acid (FOLVITE) 1 MG tablet Take 1 tablet (1 mg total) by mouth daily. 06/30/21  Yes  [name], MD  furosemide (LASIX) 20 MG tablet Take 1 tablet (20 mg total) by mouth 2 (two) times daily. 06/30/21  Yes [name], MD  lactulose (CEPHULAC) 10 g packet Take 1 packet (10 g total) by mouth 3 (three) times daily. 06/30/21  Yes [name], MD  magnesium oxide (MAG-OX) 400 (240 Mg) MG tablet Take 2 tablets (800 mg total) by mouth 2 (two) times daily. 06/30/21 07/30/21 Yes [name], MD  Multiple Vitamin (MULTIVITAMIN WITH MINERALS) TABS tablet Take 1 tablet by mouth daily. 06/30/21  Yes [name], MD  spironolactone (ALDACTONE) 100 MG tablet Take 1 tablet (100  mg total) by mouth daily. 06/30/21  Yes [name], MD  thiamine 100 MG tablet Take 1 tablet (100 mg total) by mouth daily. 06/30/21  Yes [name], MD    Scheduled Meds:  folic acid  1 mg Oral Daily   lactulose  10 g Oral BID   magnesium oxide  800 mg Oral BID   multivitamin with minerals  1 tablet Oral Daily   thiamine  100 mg Oral Daily   Infusions:  albumin human 25 g (07/12/21 0348)   PRN Meds:    Allergies as of 07/11/2021   (No Known Allergies)    Family History  Family history unknown: Yes    Social History   Socioeconomic History   Marital status: Single    Spouse name: Not on file   Number of children: Not on file   Years of education: Not on file   Highest education level: Not on file  Occupational History   Not on file  Tobacco Use   Smoking status: Never   Smokeless tobacco: Never  Substance and Sexual Activity   Alcohol use: Not Currently   Drug use: Not on file   Sexual activity: Not on file  Other Topics Concern   Not on file  Social History Narrative   Not on file   Social Determinants of Health   Financial Resource Strain: Not on file  Food Insecurity: Not on file  Transportation Needs: Not on file  Physical Activity: Not on file  Stress: Not on file  Social Connections: Not on file  Intimate Partner Violence: Not on file    REVIEW OF SYSTEMS: Constitutional: Denies weakness, fatigue. ENT:  No nose bleeds Pulm: Denies shortness of breath, cough. CV:  No palpitations, no LE edema.  Denies angina. GU:  No hematuria, no frequency GI: See HPI.  No nausea or vomiting.  No abdominal pain. Heme:  Denies unusual or excessive bleeding or bruising other than the pink bloody liquid leaking from the abdomen Transfusions:  Neuro:  No headaches, no peripheral tingling or numbness.  No syncope, no seizures. Psych:   Insomnia. Derm:  No itching, no rash or sores.  Endone:  No sweats or chills.  No polyuria or dysuria  Travel:   None   PHYSICAL EXAM: Vital signs in last 24 hours: Vitals:   07/12/21 1000 07/12/21 1027  BP: (!) 102/57 (!) 102/57  Pulse: (!) 104 (!) 104  Resp:  18  Temp:  98 F (36.7 C)  SpO2: 100% 100%   Wt Readings from Last 3 Encounters:  07/11/21 62.9 kg  06/30/21 62.9 kg    General: Patient looks unwell.  Alert.  Woke her up from a nap but she maintains arousal. Head: No facial asymmetry or swelling no signs of head trauma. Eyes: Conjunctiva pale.  Scleral  icterus. Ears: Not hard of hearing Nose: No congestion or discharge Mouth: Poor dentition.  No blood or lesions in the mouth.  Midline Neck: No JVD, no masses, no thyromegaly Lungs: No labored breathing or cough.  Lungs clear bilaterally Heart: RRR Abdomen: Slight distention but soft.  Not tender.  There is gauze and clear occlusive dressing covering the paracentesis site.  The dressing was placed yesterday.  There is no saturation or blood on the gauze.Marland Kitchen   Rectal: Deferred Musc/Skeltl: No obvious joint deformities or swelling. Extremities: Pitting edema in the legs, anasarca. Neurologic: Alert.  Oriented x3.  No asterixis.  Paucity of speech. Skin: No rash, no sores, no suspicious lesions   Psych: Odd affect but cooperative.  Intake/Output from previous day: No intake/output data recorded. Intake/Output this shift: Total I/O In: 47.4 [IV Piggyback:47.4] Out: -   LAB RESULTS: Recent Labs    07/11/21 1707  WBC 14.3*  HGB 6.2*  HCT 19.7*  PLT 107*   BMET Lab Results  Component Value Date   NA 128 (L) 07/11/2021   NA 127 (L) 07/09/2021   NA 130 (L) 07/08/2021   K 4.5 07/11/2021   K 4.2 07/09/2021   K 4.4 07/08/2021   CL 102 07/11/2021   CL 100 07/09/2021   CL 102 07/08/2021   CO2 20 (L) 07/11/2021   CO2 19 (L) 07/09/2021   CO2 21 (L) 07/08/2021   GLUCOSE 106 (H) 07/11/2021   GLUCOSE 85 07/09/2021   GLUCOSE 72 07/08/2021   BUN 13 07/11/2021   BUN 13 07/09/2021   BUN 14 07/08/2021   CREATININE 1.14 (H)  07/11/2021   CREATININE 1.07 (H) 07/09/2021   CREATININE 0.83 07/08/2021   CALCIUM 7.5 (L) 07/11/2021   CALCIUM 8.2 (L) 07/09/2021   CALCIUM 8.2 (L) 07/08/2021   LFT Recent Labs    07/11/21 1707  PROT 7.7  ALBUMIN 1.5*  AST 94*  ALT 40  ALKPHOS 155*  BILITOT 18.1*   PT/INR Lab Results  Component Value Date   INR 2.5 (H) 07/11/2021   INR 2.6 (H) 07/09/2021   INR 2.6 (H) 07/08/2021   Hepatitis Panel No results for input(s): HEPBSAG, HCVAB, HEPAIGM, HEPBIGM in the last 72 hours. C-Diff No components found for: CDIFF Lipase  No results found for: LIPASE  Drugs of Abuse  No results found for: LABOPIA, COCAINSCRNUR, LABBENZ, AMPHETMU, THCU, LABBARB   RADIOLOGY STUDIES: No results found.    IMPRESSION:      Blood loss anemia.     Bleeding from site of 100 mL diagnostic paracentesis performed at bedside by resident on 7/27.  No SBP on this study.  Suspect that this "bleeding" is a combination of blood and ascites.      Alcoholic liver disease with cirrhosis and recent admission for decompensation with acute alcoholic hepatitis.  T bili 18.1.  alk phos 155.   AST/ALT 94/40.  These are worse than during admission in late July when T bili 9.6, alk phos 124, AST/ALT 124/44.     AKI, HRS?  During admission in late July.  Had resolved by discharge.  Currently with mildly elevated creatinine at 1.14.      Hyponatremia at 128.  Improved from readings as low as 123 on 8/7.     Coagulopathy.  INR 2.5.  This is worse than previous readings of 2.1 during recent admission.     Thrombocytopenia.  Platelets 107, down from previous.      Alcoholism.  Patient says she has  not consumed alcoholic beverages since prior admission.    PLAN:     Will order vitamin K to see if it improves her coags.  Obviously her liver disease has created a coagulopathic situation.  She is not a candidate for liver transplant as of last admission.  She is homeless, recent alcohol abuse   Azucena Freed  07/12/2021, 2:18 PM Phone [phone]   Attending Physician's Attestation   I have taken an interval history, reviewed the chart and examined the patient.   35 y/o fm with recent admission for alcoholic hepatitic/likely decompensated cirrhosis with ascites and HE, with multiple ER visits for continued bleeding from her paracentesis site her previous admission.  Pt states the bleeding this time was much more profuse than previous.  Noted to have worsened anemia from baseline (7-8) and was transfused a unit of pRBCs.  Of note, on initial presentation she had a hgb of 1.8 (discharged at 7.5)  She reports frequent loose stools which is likely related to the lactulose, but she denies any symptoms suggestive of GI blood loss (dark stools/melena/hematochezia).  No abdominal pain, n/v, decreased appetite.  The patient warrants an EGD to screen for varices and also to assess for sources of potential chronic GI bleed such as GAVE/portal gastropathy, but I don't think she has an acute GI bleed causing her drop in hgb.  No plan for EGD tomorrow.  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   Dustin Flock, MD [facility]

## 2021-07-12 NOTE — ED Notes (Signed)
This RN at bedside to connect pt to cardiac monitor. Pt actively refusing additional equipment at this time stating "I feel paranoid and y'all know something I don't know, I don't want it". Will continue to reassess for pt readiness.

## 2021-07-12 NOTE — ED Notes (Signed)
Pt reports continued and worsening paranoia and feels that people are out to get her and are watching her. She requests something for anxiety. RN sent request to attending physician.

## 2021-07-12 NOTE — Telephone Encounter (Signed)
Dr Chales Abrahams please advise as to how you would like to proceed. Thank You

## 2021-07-12 NOTE — H&P (Addendum)
History and Physical    Laura Caldas IOX:[id number] DOB: May 24, 1986 DOA: 07/11/2021  PCP: Pcp, No  Patient coming from: Home.  Chief Complaint: Bleeding from paracentesis site.  HPI: Michelle Little is a 35 y.o. female with history of recent admission for hepatic encephalopathy discharged about 12 days ago at that time patient was briefly intubated.  Patient had paracentesis done during the last admission.  Over the last 3 to 4 days patient has been noticing bleeding from the paracentesis site.  This is at least a second visit now.  Denies abdominal pain nausea vomiting diarrhea fever or chills.  ED Course: In the ER on exam patient has peripheral edema and slightly distended abdomen.  Abdomen is nontender.  Labs show hemoglobin of 6.2 which is gradually declining from 7.8 in August 15 about 4 days ago.  INR is 2.5 platelets 107.  ER physician was able to place a suture at the bleeding site and the bleeding had stopped.  Since patient is significant drop in hemoglobin and also creatinine has worsened from 0.8 on August 15 and it is around 1.1 patient admitted for further management.  PRBC transfusion has been ordered.  Review of Systems: As per HPI, rest all negative.   Past Medical History:  Diagnosis Date   Thyroid disease     History reviewed. No pertinent surgical history.   reports that she has never smoked. She has never used smokeless tobacco. She reports that she does not currently use alcohol. No history on file for drug use.  No Known Allergies  Family History  Family history unknown: Yes    Prior to Admission medications   Medication Sig Start Date End Date Taking? Authorizing Provider  folic acid (FOLVITE) 1 MG tablet Take 1 tablet (1 mg total) by mouth daily. 06/30/21  Yes Azucena Fallen, MD  furosemide (LASIX) 20 MG tablet Take 1 tablet (20 mg total) by mouth 2 (two) times daily. 06/30/21  Yes Azucena Fallen, MD  lactulose (CEPHULAC) 10 g packet Take 1 packet  (10 g total) by mouth 3 (three) times daily. 06/30/21  Yes Azucena Fallen, MD  magnesium oxide (MAG-OX) 400 (240 Mg) MG tablet Take 2 tablets (800 mg total) by mouth 2 (two) times daily. 06/30/21 07/30/21 Yes Azucena Fallen, MD  Multiple Vitamin (MULTIVITAMIN WITH MINERALS) TABS tablet Take 1 tablet by mouth daily. 06/30/21  Yes Azucena Fallen, MD  spironolactone (ALDACTONE) 100 MG tablet Take 1 tablet (100 mg total) by mouth daily. 06/30/21  Yes Azucena Fallen, MD  thiamine 100 MG tablet Take 1 tablet (100 mg total) by mouth daily. 06/30/21  Yes Azucena Fallen, MD    Physical Exam: Constitutional: Moderately built and nourished. Vitals:   07/11/21 2331 07/11/21 2332 07/11/21 2337 07/11/21 2347  BP: (!) 101/50 (!) 101/50  (!) 107/57  Pulse: 92 90  100  Resp: 15 16  16   Temp:  98.8 F (37.1 C)  98.8 F (37.1 C)  TempSrc:  Oral  Oral  SpO2: 100%   100%  Weight:   62.9 kg   Height:   5\' 4"  (1.626 m)    Eyes: Anicteric no pallor. ENMT: No discharge from the ears eyes nose and mouth. Neck: No mass felt.  No neck rigidity. Respiratory: No rhonchi or crepitations. Cardiovascular: S1-S2 heard. Abdomen: Suture site seen.  Nontender mildly distended. Musculoskeletal: Bilateral lower extremity edema present. Skin: No rash. Neurologic: Alert awake oriented to time place and person.  Moves  all extremities. Psychiatric: Appears normal.  Normal affect.   Labs on Admission: I have personally reviewed following labs and imaging studies  CBC: Recent Labs  Lab 07/07/21 1054 07/08/21 1035 07/09/21 1023 07/11/21 1707  WBC 12.9* 12.9* 12.7* 14.3*  NEUTROABS 10.3* 9.9* 9.6* 11.3*  HGB 7.9* 7.8* 6.5* 6.2*  HCT 25.2* 24.9* 20.2* 19.7*  MCV 105.4* 104.6* 104.7* 108.2*  PLT 141* 110* 103* 107*   Basic Metabolic Panel: Recent Labs  Lab 07/07/21 1054 07/08/21 1035 07/09/21 1023 07/11/21 1707  NA 128* 130* 127* 128*  K 4.1 4.4 4.2 4.5  CL 100 102 100 102  CO2 19* 21*  19* 20*  GLUCOSE 98 72 85 106*  BUN 11 14 13 13   CREATININE 1.01* 0.83 1.07* 1.14*  CALCIUM 8.1* 8.2* 8.2* 7.5*   GFR: Estimated Creatinine Clearance: 58.3 mL/min (A) (by C-G formula based on SCr of 1.14 mg/dL (H)). Liver Function Tests: Recent Labs  Lab 07/07/21 1054 07/08/21 1035 07/09/21 1023 07/11/21 1707  AST 91* 93* 89* 94*  ALT 39 40 39 40  ALKPHOS 188* 179* 158* 155*  BILITOT 18.9* 17.1* 19.0* 18.1*  PROT 8.1 7.6 7.7 7.7  ALBUMIN 1.4* 1.5* 1.5* 1.5*   No results for input(s): LIPASE, AMYLASE in the last 168 hours. No results for input(s): AMMONIA in the last 168 hours. Coagulation Profile: Recent Labs  Lab 07/07/21 1054 07/08/21 1035 07/09/21 1023 07/11/21 1707  INR 2.5* 2.6* 2.6* 2.5*   Cardiac Enzymes: No results for input(s): CKTOTAL, CKMB, CKMBINDEX, TROPONINI in the last 168 hours. BNP (last 3 results) No results for input(s): PROBNP in the last 8760 hours. HbA1C: No results for input(s): HGBA1C in the last 72 hours. CBG: No results for input(s): GLUCAP in the last 168 hours. Lipid Profile: No results for input(s): CHOL, HDL, LDLCALC, TRIG, CHOLHDL, LDLDIRECT in the last 72 hours. Thyroid Function Tests: No results for input(s): TSH, T4TOTAL, FREET4, T3FREE, THYROIDAB in the last 72 hours. Anemia Panel: No results for input(s): VITAMINB12, FOLATE, FERRITIN, TIBC, IRON, RETICCTPCT in the last 72 hours. Urine analysis:    Component Value Date/Time   COLORURINE AMBER (A) 06/17/2021 1011   APPEARANCEUR HAZY (A) 06/17/2021 1011   LABSPEC 1.020 06/17/2021 1011   PHURINE 5.0 06/17/2021 1011   GLUCOSEU NEGATIVE 06/17/2021 1011   HGBUR NEGATIVE 06/17/2021 1011   BILIRUBINUR LARGE (A) 06/17/2021 1011   KETONESUR 15 (A) 06/17/2021 1011   PROTEINUR 30 (A) 06/17/2021 1011   NITRITE NEGATIVE 06/17/2021 1011   LEUKOCYTESUR MODERATE (A) 06/17/2021 1011   Sepsis Labs: @LABRCNTIP (procalcitonin:4,lacticidven:4) )No results found for this or any previous visit  (from the past 240 hour(s)).   Radiological Exams on Admission: No results found.    Assessment/Plan Principal Problem:   Acute blood loss anemia Active Problems:   Cirrhosis (HCC)    Acute blood loss anemia from bleeding at the paracentesis site.  Bleeding stopped after the sutures were placed by the ER physician.  I will order vitamin K 5 mg IV due to coagulopathy with INR of 2.5.  PRBC transfusion has been ordered.  Follow CBC after transfusion. Acute renal failure with creatinine worsening from 0.8 on August 15 4 days ago it is around 1.1 with albumin of 1.5.  I am holding patient's Lasix and Aldactone now and I have ordered to albumin transfusions.  Recheck creatinine and restart diuretics based on the response. Coagulopathy secondary to liver cirrhosis I have ordered 1 dose of vitamin K. Liver cirrhosis secondary alcoholism -patient  states she has not had any alcohol since last admission 2 weeks ago.  Closely monitor.  See number 2 with regarding to diuretics.  Continue lactulose.  Meld score of at least 27. History of alcohol abuse patient states she has not had any alcohol for last 2 weeks.  On thiamine. Persistent elevated leukocytosis count.  We will closely monitor.  Patient is afebrile. Thrombocytopenia likely from cirrhosis follow CBC.  Since patient has acute blood loss anemia with acute renal failure with cirrhosis with coagulopathy will need close monitoring for any decompensation and inpatient status.   COVID test is pending.   DVT prophylaxis: SCDs.  Avoiding anticoagulation due to coagulopathy and thrombocytopenia. Code Status: Full code. Family Communication: Discussed with patient. Disposition Plan: Home. Consults called: None. Admission status: Inpatient.   Eduard Clos MD [facility] Pager [phone].  If 7PM-7AM, please contact night-coverage [email or web address] Password [email or web address]  07/12/2021, 1:47 AM

## 2021-07-12 NOTE — Telephone Encounter (Signed)
Liver Disease Referral Update - Received fax from [facility]. Patient declined to schedule a new patient appointment with their office stating doesn't have insurance.

## 2021-07-12 NOTE — Progress Notes (Signed)
   Patient seen and examined at bedside, patient admitted after midnight, please see earlier detailed admission note by Eduard Clos, MD. Briefly, patient presented secondary to   Subjective: Hungry as she does not like the food she was given.  BP (!) 102/57 (BP Location: Right Arm)   Pulse (!) 104   Temp 98 F (36.7 C) (Oral)   Resp 18   Ht 5\' 4"  (1.626 m)   Wt 62.9 kg   SpO2 100%   BMI 23.80 kg/m   General exam: Appears calm and comfortable Respiratory system: Clear to auscultation. Respiratory effort normal. Cardiovascular system: S1 & S2 heard, RRR. 3/6 systolic murmur Gastrointestinal system: Abdomen is distended, soft and non-tender except for at procedure site. Normal bowel sounds heard. Central nervous system: Alert and oriented. No focal neurological deficits. Musculoskeletal: BLE edema. No calf tenderness Skin: No cyanosis.  Psychiatry: Judgement and insight appear normal. Mood & affect appropriate.   Brief assessment/Plan:  Acute blood loss anemia Secondary to hemorrhage from paracentesis site. Baseline hemoglobin of about 7.5-8. Hemoglobin of 6.2 on admission. Complicated by coagulopathy and thrombocytopenia. 1 unit of PRBC transfused on 8/18. She is no longer bleeding from paracentesis site. -Post-transfusion CBC  AKI Mild. Likely secondary to blood loss.  Coagulopathy Secondary to liver disease. INR of 2.5 on admission. Treated with vitamin K 5 mg IV secondary to hemorrhage.  Alcoholic liver cirrhosis Decompensated liver disease Secondary to alcohol use. Follows with Gastroenterology. Currently on furosemide and spironolactone as an outpatient. Maddrey's discriminate factor of >90. Will possibly need prednisolone therapy but will defer to GI -GI consult -2 gram sodium diet  Elevated ammonia Ammonia of 81. No obvious evidence of encephalopathy. Patient states she is having multiple runny stools -Decrease to lactulose 10 g TID for now  History of  alcohol abuse Per patient, she has stopped drinking. On thiamine and folic acid as an outpatient  Leukocytosis Chronic. Stable.  Thrombocytopenia Chronic. Stable.  Family communication: None at bedside DVT prophylaxis: SCDs Disposition: Discharge in 24 hours if hemoglobin stable and pending GI recommendations if this can be handled as an outpatient, however, with patient's follow-up issue, she may need to be managed as an inpatient.  9/18, MD [facility] 07/12/2021, 1:17 PM

## 2021-07-13 ENCOUNTER — Inpatient Hospital Stay (HOSPITAL_COMMUNITY): Payer: Medicaid Other

## 2021-07-13 DIAGNOSIS — T8130XA Disruption of wound, unspecified, initial encounter: Principal | ICD-10-CM

## 2021-07-13 DIAGNOSIS — D649 Anemia, unspecified: Secondary | ICD-10-CM

## 2021-07-13 DIAGNOSIS — K7031 Alcoholic cirrhosis of liver with ascites: Secondary | ICD-10-CM

## 2021-07-13 DIAGNOSIS — K7290 Hepatic failure, unspecified without coma: Secondary | ICD-10-CM

## 2021-07-13 LAB — HEPATIC FUNCTION PANEL
ALT: 32 U/L (ref 0–44)
AST: 80 U/L — ABNORMAL HIGH (ref 15–41)
Albumin: 1.3 g/dL — ABNORMAL LOW (ref 3.5–5.0)
Alkaline Phosphatase: 157 U/L — ABNORMAL HIGH (ref 38–126)
Bilirubin, Direct: 7.7 mg/dL — ABNORMAL HIGH (ref 0.0–0.2)
Indirect Bilirubin: 7.1 mg/dL — ABNORMAL HIGH (ref 0.3–0.9)
Total Bilirubin: 14.8 mg/dL — ABNORMAL HIGH (ref 0.3–1.2)
Total Protein: 6.7 g/dL (ref 6.5–8.1)

## 2021-07-13 LAB — CBC
HCT: 18.8 % — ABNORMAL LOW (ref 36.0–46.0)
HCT: 24.9 % — ABNORMAL LOW (ref 36.0–46.0)
Hemoglobin: 6.2 g/dL — CL (ref 12.0–15.0)
Hemoglobin: 8.4 g/dL — ABNORMAL LOW (ref 12.0–15.0)
MCH: 32.8 pg (ref 26.0–34.0)
MCH: 32.4 pg (ref 26.0–34.0)
MCHC: 33 g/dL (ref 30.0–36.0)
MCHC: 33.7 g/dL (ref 30.0–36.0)
MCV: 99.5 fL (ref 80.0–100.0)
MCV: 96.1 fL (ref 80.0–100.0)
Platelets: 74 10*3/uL — ABNORMAL LOW (ref 150–400)
Platelets: 75 10*3/uL — ABNORMAL LOW (ref 150–400)
RBC: 1.89 MIL/uL — ABNORMAL LOW (ref 3.87–5.11)
RBC: 2.59 MIL/uL — ABNORMAL LOW (ref 3.87–5.11)
RDW: 25.3 % — ABNORMAL HIGH (ref 11.5–15.5)
WBC: 12.8 10*3/uL — ABNORMAL HIGH (ref 4.0–10.5)
WBC: 13 10*3/uL — ABNORMAL HIGH (ref 4.0–10.5)
nRBC: 0 % (ref 0.0–0.2)
nRBC: 0.2 % (ref 0.0–0.2)

## 2021-07-13 LAB — BASIC METABOLIC PANEL
Anion gap: 6 (ref 5–15)
BUN: 11 mg/dL (ref 6–20)
CO2: 18 mmol/L — ABNORMAL LOW (ref 22–32)
Calcium: 7.5 mg/dL — ABNORMAL LOW (ref 8.9–10.3)
Chloride: 102 mmol/L (ref 98–111)
Creatinine, Ser: 0.92 mg/dL (ref 0.44–1.00)
GFR, Estimated: >60 mL/min (ref >60)
Glucose, Bld: 93 mg/dL (ref 70–99)
Potassium: 4.4 mmol/L (ref 3.5–5.1)
Sodium: 126 mmol/L — ABNORMAL LOW (ref 135–145)

## 2021-07-13 LAB — PATHOLOGIST SMEAR REVIEW

## 2021-07-13 MED ORDER — LACTULOSE 10 GM/15ML PO SOLN
10.0000 g | Freq: Two times a day (BID) | ORAL | Status: DC
  Administered 2021-07-13 – 2021-07-16 (×7): 10 g via ORAL
  Filled 2021-07-13 (×6): qty 15

## 2021-07-13 MED ORDER — LACTULOSE 10 GM/15ML PO SOLN: 10.0000 g | Freq: Three times a day (TID) | ORAL | Status: DC

## 2021-07-13 MED ORDER — VITAMIN K1 10 MG/ML IJ SOLN
5.0000 mg | Freq: Every day | INTRAVENOUS | Status: AC
  Administered 2021-07-13 – 2021-07-14 (×2): 5 mg via INTRAVENOUS
  Filled 2021-07-13 (×3): qty 0.5

## 2021-07-13 MED ORDER — SODIUM CHLORIDE 0.9% IV SOLUTION: Freq: Once | INTRAVENOUS | Status: AC

## 2021-07-13 NOTE — Plan of Care (Signed)

## 2021-07-13 NOTE — Progress Notes (Addendum)
Daily Rounding Note  07/13/2021, 9:33 AM  LOS: 2 days   SUBJECTIVE:   Chief complaint: Serosanguineous discharge from site of paracentesis 3 weeks ago  Received just 1 of the q 6 h albumin infusions.  Not clear why.  It is now dc'd. Loose stools Eating well.  No nausea, no abd pain. Did not sleep well last night and is resting now.  OBJECTIVE:         Vital signs in last 24 hours:    Temp:  [97.9 F (36.6 C)-99 F (37.2 C)] 98.7 F (37.1 C) (08/20 0728) Pulse Rate:  [98-106] 100 (08/20 0728) Resp:  [14-19] 19 (08/20 0728) BP: (97-113)/(55-72) 104/68 (08/20 0728) SpO2:  [98 %-100 %] 100 % (08/20 0728) Weight:  [62.2 kg] 62.2 kg (08/20 0617)   Filed Weights   07/11/21 2337 07/13/21 0617  Weight: 62.9 kg 62.2 kg   General: Tired but arousable and maintains arousal.  Looks ill. Heart: RRR. Chest: No labored breathing or cough.  Lungs clear in front. Abdomen: Soft without tenderness.  Small gauze and clear occlusive dressing over paracentesis site.  The gauze is now soaked and orangey pink color.  Active bowel sounds Extremities: No CCE. Neuro/Psych: No asterixis.  Fully oriented.  Follows commands.  Somewhat disengaged affect.  Intake/Output from previous day: 08/19 0701 - 08/20 0700 In: 407.4 [P.O.:360; IV Piggyback:47.4] Out: -   Intake/Output this shift: Total I/O In: 120 [P.O.:120] Out: -   Lab Results: Recent Labs    07/11/21 1707 07/12/21 1429 07/13/21 0715  WBC 14.3* 13.5* 12.8*  HGB 6.2* 6.2* 6.2*  HCT 19.7* 19.2* 18.8*  PLT 107* 76* 74*   BMET Recent Labs    07/11/21 1707 07/13/21 0715  NA 128* 126*  K 4.5 4.4  CL 102 102  CO2 20* 18*  GLUCOSE 106* 93  BUN 13 11  CREATININE 1.14* 0.92  CALCIUM 7.5* 7.5*   LFT Recent Labs    07/11/21 1707 07/13/21 0715  PROT 7.7 6.7  ALBUMIN 1.5* 1.3*  AST 94* 80*  ALT 40 32  ALKPHOS 155* 157*  BILITOT 18.1* 14.8*  BILIDIR  --  7.7*   IBILI  --  7.1*   PT/INR Recent Labs    07/11/21 1707  LABPROT 27.3*  INR 2.5*   Hepatitis Panel No results for input(s): HEPBSAG, HCVAB, HEPAIGM, HEPBIGM in the last 72 hours.  Studies/Results: No results found.  Scheduled Meds:  folic acid  1 mg Oral Daily   lactulose  10 g Oral BID   magnesium oxide  800 mg Oral BID   multivitamin with minerals  1 tablet Oral Daily   thiamine  100 mg Oral Daily   Continuous Infusions:  phytonadione (VITAMIN K) IV 5 mg (07/12/21 1842)   PRN Meds:.phenol   ASSESMENT:   Serosanguineous discharge from site of diagnostic, 100 cc paracentesis 3 to 4 weeks ago.  This is probably ongoing leaking of ascites mixed with some blood.     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  Alcoholic hepatitis.  T bili improved 19 >> 14.8 over the past 5 days.  Alk phos stable but overall improved over the last week.  Transaminases stable.  Patient says she has not consumed alcohol since admission late July.     Progressive anemia.  Hgb 6.2.  HD.  On lactulose.  Coagulopathy.  Day 2 of 3 IV vitamin K.  INR 2.5.     Thrombocytopenia.  Platelets now down to 74K.     Hyponatremia.  Sodium 126.  On 2 g sodium diet.     Mildly elevated creatinine with normal GFR.  Creatinine now normal.   PLAN     EGD, ? Timing?     Reorder albumin infusions every 6 hours?     Ordered ultrasound abdomen to  assess extent of ascites.  May need paracentesis with count, differential to rule out SBP.    Michelle Little  07/13/2021, 9:33 AM Phone [phone]    Attending Physician's Attestation   I have taken an interval history, reviewed the chart and examined the patient.   No acute events overnight.  Pt's hgb stable, Scr improved slightly, tbili improved.  This morning her paracentesis site started oozing again.  The oozing was frank blood, not mixed with ascites.  It was not bleeding briskly but did continue to bleed for several minutes despite pressure with gauze.   Eventually the bleeding stopped.  She may benefit from an additional application of dermabond or similar treatment.  She continued to have multiple loose bowel movements yesterday, lactulose reduced to BID No asterixis on exam  Recommend continuing with albumin as before If going home today, would resume previous diuretic dose but she will need outpt follow to check electrolytes/Scr.  She needs to get enrolled in [place] for this.  She can contact our clinic for follow up when able. Patient given instruction for titrating lactulose dose to achieve 2-3 bowel movements per day.   [place] to reassess ascites, low suspicion for SBP given her clinical improvement at this time; given her difficulty with bleeding from paracentesis site, higher threshold for repeating that procedure.  GI will sign off at this time  I agree with the Advanced Practitioner's note, impression, and recommendations with updates and my documentation above.   Dustin Flock, MD [facility]

## 2021-07-13 NOTE — Progress Notes (Signed)
PROGRESS NOTE    [name]  XBM:[id number] DOB: 10/19/1986 DOA: 07/11/2021 PCP: Pcp, No   Brief Narrative: Michelle Little is a 35 y.o. female with a history of alcohol abuse, alcoholic liver cirrhosis with recent decompensated liver disease. Patient presented secondary to oozing from paracentesis site and found to have significant blood loss anemia requiring blood transfusion in setting of coagulopathy from liver disease.   Assessment & Plan:   Principal Problem:   Acute blood loss anemia Active Problems:   Acute on chronic anemia   Encephalopathy, hepatic (HCC)   Cirrhosis (HCC)   Wound dehiscence   Acute blood loss anemia Secondary to hemorrhage from paracentesis site. Baseline hemoglobin of about 7.5-8. Hemoglobin of 6.2 on admission. Complicated by coagulopathy and thrombocytopenia. 1 unit of PRBC transfused on 8/18. When examined today, I noticed current oozing from her paracentesis site which eventually resolved after several minutes of pressure. Hemoglobin back down to 6.2 post transfusion -2 units of PRBC today Post-transfusion CBC  AKI Mild. Likely secondary to blood loss.  Coagulopathy Secondary to liver disease. INR of 2.5 on admission. Treated with vitamin K 5 mg IV secondary to hemorrhage. GI has ordered several runs of vitamin K for continued bleeding.  Alcoholic liver cirrhosis Decompensated liver disease Secondary to alcohol use. Follows with Gastroenterology. Currently on furosemide and spironolactone as an outpatient. Maddrey's discriminate factor of >90.  -GI recommendations: signed off today -2 gram sodium diet  Elevated ammonia Ammonia of 81. No obvious evidence of encephalopathy. Patient states she is having multiple runny stools -Continue to lactulose 10 g BID for now; watch for stools  History of alcohol abuse Per patient, she has stopped drinking. On thiamine and folic acid as an outpatient  Leukocytosis Chronic.  Stable.  Thrombocytopenia Chronic. Trended down slightly.  DVT prophylaxis: SCDs Code Status:   Code Status: Full Code Family Communication: None at bedside Disposition Plan: Discharge in 24 hours pending stability of hemoglobin, improvement in bleeding   Consultants:  Gastroenterology  Procedures:  None  Antimicrobials: None    Subjective: Tired today. No other concerns.  Objective: Vitals:   07/13/21 1145 07/13/21 1200 07/13/21 1215 07/13/21 1245  BP: 101/68 102/69 99/66 (!) 132/101  Pulse:   100   Resp: (!) 24 (!) 22 20 18   Temp:   98.4 F (36.9 C)   TempSrc:   Oral   SpO2:   100% 100%  Weight:      Height:        Intake/Output Summary (Last 24 hours) at 07/13/2021 1306 Last data filed at 07/13/2021 1300 Gross per 24 hour  Intake 1030 ml  Output --  Net 1030 ml   Filed Weights   07/11/21 2337 07/13/21 0617  Weight: 62.9 kg 62.2 kg    Examination:  General exam: Appears calm and comfortable Respiratory system: Clear to auscultation. Respiratory effort normal. Cardiovascular system: S1 & S2 heard, RRR. 2/6 systolic murmur Gastrointestinal system: Abdomen is distended, soft and nontender. No organomegaly or masses felt. Normal bowel sounds heard. Oozing blood from right sided paracentesis site Central nervous system: Alert and oriented. No focal neurological deficits. Musculoskeletal: No edema. No calf tenderness Skin: No cyanosis. No rashes Psychiatry: Judgement and insight appear normal. Mood & affect appropriate.     Data Reviewed: I have personally reviewed following labs and imaging studies  CBC Lab Results  Component Value Date   WBC 12.8 (H) 07/13/2021   RBC 1.89 (L) 07/13/2021   HGB 6.2 (LL) 07/13/2021  HCT 18.8 (L) 07/13/2021   MCV 99.5 07/13/2021   MCH 32.8 07/13/2021   PLT 74 (L) 07/13/2021   MCHC 33.0 07/13/2021   RDW Not Measured 07/13/2021   LYMPHSABS 1.0 07/11/2021   MONOABS 1.5 (H) 07/11/2021   EOSABS 0.1 07/11/2021    BASOSABS 0.1 07/11/2021     Last metabolic panel Lab Results  Component Value Date   NA 126 (L) 07/13/2021   K 4.4 07/13/2021   CL 102 07/13/2021   CO2 18 (L) 07/13/2021   BUN 11 07/13/2021   CREATININE 0.92 07/13/2021   GLUCOSE 93 07/13/2021   GFRNONAA >60 07/13/2021   CALCIUM 7.5 (L) 07/13/2021   PHOS 2.6 06/19/2021   PROT 6.7 07/13/2021   ALBUMIN 1.3 (L) 07/13/2021   BILITOT 14.8 (H) 07/13/2021   ALKPHOS 157 (H) 07/13/2021   AST 80 (H) 07/13/2021   ALT 32 07/13/2021   ANIONGAP 6 07/13/2021    CBG (last 3)  No results for input(s): GLUCAP in the last 72 hours.   GFR: Estimated Creatinine Clearance: 72.3 mL/min (by C-G formula based on SCr of 0.92 mg/dL).  Coagulation Profile: Recent Labs  Lab 07/07/21 1054 07/08/21 1035 07/09/21 1023 07/11/21 1707  INR 2.5* 2.6* 2.6* 2.5*    Recent Results (from the past 240 hour(s))  SARS CORONAVIRUS 2 (TAT 6-24 HRS) Nasopharyngeal Nasopharyngeal Swab     Status: None   Collection Time: 07/12/21  4:45 AM   Specimen: Nasopharyngeal Swab  Result Value Ref Range Status   SARS Coronavirus 2 NEGATIVE NEGATIVE Final    Comment: (NOTE) SARS-CoV-2 target nucleic acids are NOT DETECTED.  The SARS-CoV-2 RNA is generally detectable in upper and lower respiratory specimens during the acute phase of infection. Negative results do not preclude SARS-CoV-2 infection, do not rule out co-infections with other pathogens, and should not be used as the sole basis for treatment or other patient management decisions. Negative results must be combined with clinical observations, patient history, and epidemiological information. The expected result is Negative.  Fact Sheet for Patients: HairSlick.no  Fact Sheet for Healthcare Providers: quierodirigir.com  This test is not yet approved or cleared by the [place] FDA and  has been authorized for detection and/or diagnosis of  SARS-CoV-2 by FDA under an Emergency Use Authorization (EUA). This EUA will remain  in effect (meaning this test can be used) for the duration of the COVID-19 declaration under Se ction 564(b)(1) of the Act, 21 U.S.C. section 360bbb-3(b)(1), unless the authorization is terminated or revoked sooner.  Performed at [facility], [address], [place], [place] [address]   MRSA Next Gen by PCR, Nasal     Status: None   Collection Time: 07/12/21  8:14 PM   Specimen: Nasal Mucosa; Nasal Swab  Result Value Ref Range Status   MRSA by PCR Next Gen NOT DETECTED NOT DETECTED Final    Comment: (NOTE) The GeneXpert MRSA Assay (FDA approved for NASAL specimens only), is one component of a comprehensive MRSA colonization surveillance program. It is not intended to diagnose MRSA infection nor to guide or monitor treatment for MRSA infections. Test performance is not FDA approved in patients less than 87 years old. Performed at [facility], [address], [place], [place] [address]         Radiology Studies: No results found.      Scheduled Meds:  folic acid  1 mg Oral Daily   lactulose  10 g Oral BID   magnesium  oxide  800 mg Oral BID   multivitamin with minerals  1 tablet Oral Daily   thiamine  100 mg Oral Daily   Continuous Infusions:  phytonadione (VITAMIN K) IV       LOS: 2 days     Jacquelin Hawking, MD [facility] 07/13/2021, 1:06 PM  If 7PM-7AM, please contact night-coverage [email or web address]

## 2021-07-14 LAB — BPAM RBC
Blood Product Expiration Date: 202209132359
Blood Product Expiration Date: 202209152359
Blood Product Expiration Date: 202209152359
ISSUE DATE / TIME: 202208182324
ISSUE DATE / TIME: 202208201023
ISSUE DATE / TIME: 202208201506
Unit Type and Rh: 7300
Unit Type and Rh: 7300
Unit Type and Rh: 7300

## 2021-07-14 LAB — CBC
HCT: 24.6 % — ABNORMAL LOW (ref 36.0–46.0)
HCT: 26.9 % — ABNORMAL LOW (ref 36.0–46.0)
Hemoglobin: 8.5 g/dL — ABNORMAL LOW (ref 12.0–15.0)
Hemoglobin: 8.9 g/dL — ABNORMAL LOW (ref 12.0–15.0)
MCH: 33.2 pg (ref 26.0–34.0)
MCH: 32.1 pg (ref 26.0–34.0)
MCHC: 34.6 g/dL (ref 30.0–36.0)
MCHC: 33.1 g/dL (ref 30.0–36.0)
MCV: 96.1 fL (ref 80.0–100.0)
MCV: 97.1 fL (ref 80.0–100.0)
Platelets: 70 10*3/uL — ABNORMAL LOW (ref 150–400)
Platelets: 75 10*3/uL — ABNORMAL LOW (ref 150–400)
RBC: 2.56 MIL/uL — ABNORMAL LOW (ref 3.87–5.11)
RBC: 2.77 MIL/uL — ABNORMAL LOW (ref 3.87–5.11)
RDW: 25.7 % — ABNORMAL HIGH (ref 11.5–15.5)
RDW: 25.7 % — ABNORMAL HIGH (ref 11.5–15.5)
WBC: 13 10*3/uL — ABNORMAL HIGH (ref 4.0–10.5)
WBC: 13.7 10*3/uL — ABNORMAL HIGH (ref 4.0–10.5)
nRBC: 0 % (ref 0.0–0.2)
nRBC: 0 % (ref 0.0–0.2)

## 2021-07-14 LAB — COMPREHENSIVE METABOLIC PANEL
ALT: 33 U/L (ref 0–44)
AST: 76 U/L — ABNORMAL HIGH (ref 15–41)
Albumin: 1.3 g/dL — ABNORMAL LOW (ref 3.5–5.0)
Alkaline Phosphatase: 151 U/L — ABNORMAL HIGH (ref 38–126)
Anion gap: 5 (ref 5–15)
BUN: 10 mg/dL (ref 6–20)
CO2: 19 mmol/L — ABNORMAL LOW (ref 22–32)
Calcium: 7.7 mg/dL — ABNORMAL LOW (ref 8.9–10.3)
Chloride: 104 mmol/L (ref 98–111)
Creatinine, Ser: 0.84 mg/dL (ref 0.44–1.00)
GFR, Estimated: >60 mL/min (ref >60)
Glucose, Bld: 100 mg/dL — ABNORMAL HIGH (ref 70–99)
Potassium: 4.4 mmol/L (ref 3.5–5.1)
Sodium: 128 mmol/L — ABNORMAL LOW (ref 135–145)
Total Bilirubin: 15.4 mg/dL — ABNORMAL HIGH (ref 0.3–1.2)
Total Protein: 6.7 g/dL (ref 6.5–8.1)

## 2021-07-14 LAB — TYPE AND SCREEN
ABO/RH(D): B POS
Antibody Screen: NEGATIVE
Unit division: 0
Unit division: 0
Unit division: 0

## 2021-07-14 LAB — PROTIME-INR
INR: 2.7 — ABNORMAL HIGH (ref 0.8–1.2)
Prothrombin Time: 28.4 seconds — ABNORMAL HIGH (ref 11.4–15.2)

## 2021-07-14 MED ORDER — ALUM & MAG HYDROXIDE-SIMETH 200-200-20 MG/5ML PO SUSP
15.0000 mL | Freq: Four times a day (QID) | ORAL | Status: DC | PRN
  Administered 2021-07-14: 15 mL via ORAL
  Filled 2021-07-14: qty 30

## 2021-07-14 NOTE — Progress Notes (Addendum)
Pt was bleeding from Paracentesis site this morning, soaked 3-4 gauge and soaked bed pad too, bleeding was just continuous for good 30-40 mins. Pressure was held for 30 mins, appreciate help from RN Britta Mccreedy, Dr Caleb Popp informed and applied Derm band in bed side, will continue to monitor the patient  Lonia Farber, [place]

## 2021-07-14 NOTE — Progress Notes (Signed)
PROGRESS NOTE    Michelle Little  QBH:[id number] DOB: 1986/07/07 DOA: 07/11/2021 PCP: Pcp, No   Brief Narrative: Michelle Little is a 35 y.o. female with a history of alcohol abuse, alcoholic liver cirrhosis with recent decompensated liver disease. Patient presented secondary to oozing from paracentesis site and found to have significant blood loss anemia requiring blood transfusion in setting of coagulopathy from liver disease.   Assessment & Plan:   Principal Problem:   Acute blood loss anemia Active Problems:   Acute on chronic anemia   Encephalopathy, hepatic (HCC)   Cirrhosis (HCC)   Wound dehiscence   Acute blood loss anemia Secondary to hemorrhage from paracentesis site. Baseline hemoglobin of about 7.5-8. Hemoglobin of 6.2 on admission. Complicated by coagulopathy and thrombocytopenia. 1 unit of PRBC transfused on 8/18. When examined today, I noticed current oozing from her paracentesis site which eventually resolved after several minutes of pressure. Hemoglobin back down to 6.2 post  initial 1 unit transfusion. 2 units of PRBC transfused on 8/20 with post-transfusion hemoglobin of 8.4 and stable. Still bleeding today. Homeostasis achieved by nursing after several minutes of pressure. Dermabond applied, but unsure if this will hold. May need an extra stitch -CBC in AM -Patient will need removal of stitch likely on 8/27 to allow 10 days  AKI Mild. Likely secondary to blood loss. Resolved.  Coagulopathy Secondary to liver disease. INR of 2.5 on admission. Treated with vitamin K 5 mg IV secondary to hemorrhage. GI has ordered several runs of vitamin K for continued bleeding. If continued bleeding, will need to order FFP  Alcoholic liver cirrhosis Decompensated liver disease Secondary to alcohol use. Follows with Gastroenterology. Currently on furosemide and spironolactone as an outpatient. Maddrey's discriminate factor of >90.  -GI recommendations: signed off -2 gram sodium  diet  Elevated ammonia Ammonia of 81. No obvious evidence of encephalopathy. Patient states she is having multiple runny stools -Continue to lactulose 10 g BID for now; watch for stools  History of alcohol abuse Per patient, she has stopped drinking. On thiamine and folic acid as an outpatient  Leukocytosis Chronic. Stable.  Thrombocytopenia Chronic. Trended down slightly.  DVT prophylaxis: SCDs Code Status:   Code Status: Full Code Family Communication: None at bedside Disposition Plan: Discharge in 24 hours pending stability of hemoglobin, improvement in bleeding   Consultants:  Gastroenterology  Procedures:  None  Antimicrobials: None    Subjective: Bleeding restarted this morning. No other issues.  Objective: Vitals:   07/13/21 2100 07/14/21 0512 07/14/21 0714 07/14/21 0824  BP: 119/73 101/66 121/66 112/67  Pulse: 93 89 88 88  Resp: 15 18 18    Temp: 97.9 F (36.6 C) 98.1 F (36.7 C) 98.5 F (36.9 C)   TempSrc: Oral Oral Oral   SpO2: 97% 99% 98%   Weight:  61.6 kg    Height:        Intake/Output Summary (Last 24 hours) at 07/14/2021 0953 Last data filed at 07/13/2021 2100 Gross per 24 hour  Intake 923.01 ml  Output --  Net 923.01 ml    Filed Weights   07/11/21 2337 07/13/21 0617 07/14/21 0512  Weight: 62.9 kg 62.2 kg 61.6 kg    Examination:  General exam: Appears calm and comfortable Respiratory system: Clear to auscultation. Respiratory effort normal. Cardiovascular system: S1 & S2 heard, RRR. No murmurs, rubs, gallops or clicks. Gastrointestinal system: Abdomen is distended, soft and nontender. Normal bowel sounds heard. Right paracentesis site with oozing blood; one stitch present Central nervous  system: Alert and oriented. No focal neurological deficits. Musculoskeletal: 2+ BLE pitting edema. No calf tenderness Skin: No cyanosis. No rashes Psychiatry: Judgement and insight appear normal. Mood & affect appropriate.     Data Reviewed: I  have personally reviewed following labs and imaging studies  CBC Lab Results  Component Value Date   WBC 13.0 (H) 07/14/2021   RBC 2.56 (L) 07/14/2021   HGB 8.5 (L) 07/14/2021   HCT 24.6 (L) 07/14/2021   MCV 96.1 07/14/2021   MCH 33.2 07/14/2021   PLT 70 (L) 07/14/2021   MCHC 34.6 07/14/2021   RDW 25.7 (H) 07/14/2021   LYMPHSABS 1.0 07/11/2021   MONOABS 1.5 (H) 07/11/2021   EOSABS 0.1 07/11/2021   BASOSABS 0.1 07/11/2021     Last metabolic panel Lab Results  Component Value Date   NA 128 (L) 07/14/2021   K 4.4 07/14/2021   CL 104 07/14/2021   CO2 19 (L) 07/14/2021   BUN 10 07/14/2021   CREATININE 0.84 07/14/2021   GLUCOSE 100 (H) 07/14/2021   GFRNONAA >60 07/14/2021   CALCIUM 7.7 (L) 07/14/2021   PHOS 2.6 06/19/2021   PROT 6.7 07/14/2021   ALBUMIN 1.3 (L) 07/14/2021   BILITOT 15.4 (H) 07/14/2021   ALKPHOS 151 (H) 07/14/2021   AST 76 (H) 07/14/2021   ALT 33 07/14/2021   ANIONGAP 5 07/14/2021    CBG (last 3)  No results for input(s): GLUCAP in the last 72 hours.   GFR: Estimated Creatinine Clearance: 79.2 mL/min (by C-G formula based on SCr of 0.84 mg/dL).  Coagulation Profile: Recent Labs  Lab 07/07/21 1054 07/08/21 1035 07/09/21 1023 07/11/21 1707  INR 2.5* 2.6* 2.6* 2.5*     Recent Results (from the past 240 hour(s))  SARS CORONAVIRUS 2 (TAT 6-24 HRS) Nasopharyngeal Nasopharyngeal Swab     Status: None   Collection Time: 07/12/21  4:45 AM   Specimen: Nasopharyngeal Swab  Result Value Ref Range Status   SARS Coronavirus 2 NEGATIVE NEGATIVE Final    Comment: (NOTE) SARS-CoV-2 target nucleic acids are NOT DETECTED.  The SARS-CoV-2 RNA is generally detectable in upper and lower respiratory specimens during the acute phase of infection. Negative results do not preclude SARS-CoV-2 infection, do not rule out co-infections with other pathogens, and should not be used as the sole basis for treatment or other patient management decisions. Negative  results must be combined with clinical observations, patient history, and epidemiological information. The expected result is Negative.  Fact Sheet for Patients: HairSlick.no  Fact Sheet for Healthcare Providers: quierodirigir.com  This test is not yet approved or cleared by the [place] FDA and  has been authorized for detection and/or diagnosis of SARS-CoV-2 by FDA under an Emergency Use Authorization (EUA). This EUA will remain  in effect (meaning this test can be used) for the duration of the COVID-19 declaration under Se ction 564(b)(1) of the Act, 21 U.S.C. section 360bbb-3(b)(1), unless the authorization is terminated or revoked sooner.  Performed at [facility], [address], [place], [place] [address]   MRSA Next Gen by PCR, Nasal     Status: None   Collection Time: 07/12/21  8:14 PM   Specimen: Nasal Mucosa; Nasal Swab  Result Value Ref Range Status   MRSA by PCR Next Gen NOT DETECTED NOT DETECTED Final    Comment: (NOTE) The GeneXpert MRSA Assay (FDA approved for NASAL specimens only), is one component of a comprehensive MRSA colonization surveillance program. It is not intended to diagnose  MRSA infection nor to guide or monitor treatment for MRSA infections. Test performance is not FDA approved in patients less than 55 years old. Performed at [facility], [address], [place], [place] [address]          Radiology Studies: US Abdomen Limited  Result Date: 07/13/2021 CLINICAL DATA:  History of cirrhosis.  Assess for ascites. EXAM: LIMITED ABDOMEN ULTRASOUND FOR ASCITES TECHNIQUE: Limited ultrasound survey for ascites was performed in all four abdominal quadrants. COMPARISON:  06/18/2021 FINDINGS: Small amount of ascites noted adjacent to the cirrhotic liver and in both lower quadrants. IMPRESSION: Small amount of ascites. Electronically Signed   By: Amie Portland M.D.   On: 07/13/2021  14:01        Scheduled Meds:  folic acid  1 mg Oral Daily   lactulose  10 g Oral BID   magnesium oxide  800 mg Oral BID   multivitamin with minerals  1 tablet Oral Daily   thiamine  100 mg Oral Daily   Continuous Infusions:  phytonadione (VITAMIN K) IV 5 mg (07/14/21 0919)     LOS: 3 days     Jacquelin Hawking, MD [facility] 07/14/2021, 9:53 AM  If 7PM-7AM, please contact night-coverage [email or web address]

## 2021-07-15 LAB — CBC
HCT: 23.5 % — ABNORMAL LOW (ref 36.0–46.0)
Hemoglobin: 7.8 g/dL — ABNORMAL LOW (ref 12.0–15.0)
MCH: 32.2 pg (ref 26.0–34.0)
MCHC: 33.2 g/dL (ref 30.0–36.0)
MCV: 97.1 fL (ref 80.0–100.0)
Platelets: 72 10*3/uL — ABNORMAL LOW (ref 150–400)
RBC: 2.42 MIL/uL — ABNORMAL LOW (ref 3.87–5.11)
RDW: 25.8 % — ABNORMAL HIGH (ref 11.5–15.5)
WBC: 13.3 10*3/uL — ABNORMAL HIGH (ref 4.0–10.5)
nRBC: 0 % (ref 0.0–0.2)

## 2021-07-15 LAB — COMPREHENSIVE METABOLIC PANEL
ALT: 32 U/L (ref 0–44)
AST: 79 U/L — ABNORMAL HIGH (ref 15–41)
Albumin: 1.5 g/dL — ABNORMAL LOW (ref 3.5–5.0)
Alkaline Phosphatase: 140 U/L — ABNORMAL HIGH (ref 38–126)
Anion gap: 4 — ABNORMAL LOW (ref 5–15)
BUN: 9 mg/dL (ref 6–20)
CO2: 19 mmol/L — ABNORMAL LOW (ref 22–32)
Calcium: 7.7 mg/dL — ABNORMAL LOW (ref 8.9–10.3)
Chloride: 107 mmol/L (ref 98–111)
Creatinine, Ser: 0.86 mg/dL (ref 0.44–1.00)
GFR, Estimated: >60 mL/min (ref >60)
Glucose, Bld: 107 mg/dL — ABNORMAL HIGH (ref 70–99)
Potassium: 4.5 mmol/L (ref 3.5–5.1)
Sodium: 130 mmol/L — ABNORMAL LOW (ref 135–145)
Total Bilirubin: 17 mg/dL — ABNORMAL HIGH (ref 0.3–1.2)
Total Protein: 6.9 g/dL (ref 6.5–8.1)

## 2021-07-15 LAB — PROTIME-INR
INR: 2.4 — ABNORMAL HIGH (ref 0.8–1.2)
Prothrombin Time: 26.1 seconds — ABNORMAL HIGH (ref 11.4–15.2)

## 2021-07-15 LAB — HEMOGLOBIN AND HEMATOCRIT, BLOOD
HCT: 23.9 % — ABNORMAL LOW (ref 36.0–46.0)
Hemoglobin: 8 g/dL — ABNORMAL LOW (ref 12.0–15.0)

## 2021-07-15 MED ORDER — SODIUM CHLORIDE 0.9% IV SOLUTION: Freq: Once | INTRAVENOUS | Status: AC

## 2021-07-15 NOTE — Telephone Encounter (Signed)
Admitted RG

## 2021-07-15 NOTE — Progress Notes (Signed)
Abd, dressing saturated with serosanguinous drainage , oozing noted  on the site. Redressed with abdominal dressing. Continue to monitor.Marland Kitchen

## 2021-07-15 NOTE — Plan of Care (Signed)
  Problem: Education: Goal: Knowledge of General Education information will improve Description: Including pain rating scale, medication(s)/side effects and non-pharmacologic comfort measures Outcome: Progressing   Problem: Clinical Measurements: Goal: Ability to maintain clinical measurements within normal limits will improve Outcome: Progressing Goal: Will remain free from infection Outcome: Progressing Goal: Respiratory complications will improve Outcome: Progressing   Problem: Activity: Goal: Risk for activity intolerance will decrease Outcome: Progressing   

## 2021-07-15 NOTE — Plan of Care (Signed)
  Problem: Education: Goal: Knowledge of General Education information will improve Description: Including pain rating scale, medication(s)/side effects and non-pharmacologic comfort measures 07/15/2021 0546 by Lennice Sites, RN Outcome: Progressing 07/15/2021 0546 by Lennice Sites, RN Outcome: Progressing   Problem: Clinical Measurements: Goal: Ability to maintain clinical measurements within normal limits will improve Outcome: Progressing Goal: Will remain free from infection Outcome: Progressing Goal: Respiratory complications will improve Outcome: Progressing Goal: Cardiovascular complication will be avoided Outcome: Progressing   Problem: Activity: Goal: Risk for activity intolerance will decrease Outcome: Progressing   Problem: Nutrition: Goal: Adequate nutrition will be maintained Outcome: Progressing   Problem: Coping: Goal: Level of anxiety will decrease Outcome: Progressing   Problem: Pain Managment: Goal: General experience of comfort will improve Outcome: Progressing

## 2021-07-15 NOTE — Progress Notes (Signed)
PROGRESS NOTE    Michelle Little  WCB:[id number] DOB: 04-05-1986 DOA: 07/11/2021 PCP: Pcp, No   Brief Narrative: Michelle Little is a 35 y.o. female with a history of alcohol abuse, alcoholic liver cirrhosis with recent decompensated liver disease. Patient presented secondary to oozing from paracentesis site and found to have significant blood loss anemia requiring blood transfusion in setting of coagulopathy from liver disease.   Assessment & Plan:   Principal Problem:   Acute blood loss anemia Active Problems:   Acute on chronic anemia   Encephalopathy, hepatic (HCC)   Cirrhosis (HCC)   Wound dehiscence   Acute blood loss anemia Secondary to hemorrhage from paracentesis site. Baseline hemoglobin of about 7.5-8. Hemoglobin of 6.2 on admission. Complicated by coagulopathy and thrombocytopenia. 1 unit of PRBC transfused on 8/18. When examined today, I noticed current oozing from her paracentesis site which eventually resolved after several minutes of pressure. Hemoglobin back down to 6.2 post  initial 1 unit transfusion. 2 units of PRBC transfused on 8/20 with post-transfusion hemoglobin of 8.4 and stable. Still bleeding today. Homeostasis achieved by nursing after several minutes of pressure. Dermabond applied but patient with continued bleeding overnight -Repeat H&H this morning and likely transfuse 2 FFP -Patient will need removal of stitch likely on 8/27 to allow 10 days  AKI Mild. Likely secondary to blood loss. Resolved.  Coagulopathy Secondary to liver disease. INR of 2.5 on admission. Treated with vitamin K 5 mg IV secondary to hemorrhage. GI has ordered several runs of vitamin K for continued bleeding. FFP as mentioned above  Alcoholic liver cirrhosis Decompensated liver disease Secondary to alcohol use. Follows with Gastroenterology. Currently on furosemide and spironolactone as an outpatient. Maddrey's discriminate factor of >90. Ultrasound with small ascites. No concern  for SBP. -GI recommendations: signed off -2 gram sodium diet  Elevated ammonia Ammonia of 81. No obvious evidence of encephalopathy. Patient states she is having multiple runny stools -Continue to lactulose 10 g BID for now; watch for stools  History of alcohol abuse Per patient, she has stopped drinking. On thiamine and folic acid as an outpatient  Leukocytosis Chronic. Stable.  Thrombocytopenia Chronic. Trended down slightly.  DVT prophylaxis: SCDs Code Status:   Code Status: Full Code Family Communication: None at bedside Disposition Plan: Discharge continues to be in 24 hours pending stability of hemoglobin, improvement in bleeding   Consultants:  Gastroenterology  Procedures:  None  Antimicrobials: None    Subjective: Bleeding recurred overnight but has seemed to have stopped/slowed this morning.  Objective: Vitals:   07/14/21 2351 07/15/21 0200 07/15/21 0738 07/15/21 0800  BP: 101/62 102/72 (!) 90/55   Pulse: 100 97 (!) 200   Resp: 20 16 (!) 21   Temp: 99.1 F (37.3 C)  98.7 F (37.1 C)   TempSrc: Oral  Oral   SpO2: 98% 97% 100% 98%  Weight:  60.9 kg    Height:        Intake/Output Summary (Last 24 hours) at 07/15/2021 0951 Last data filed at 07/14/2021 2156 Gross per 24 hour  Intake 440 ml  Output --  Net 440 ml    Filed Weights   07/13/21 0617 07/14/21 0512 07/15/21 0200  Weight: 62.2 kg 61.6 kg 60.9 kg    Examination:  General exam: Appears calm and comfortable HEENT: scleral icterus Respiratory system: Clear to auscultation. Respiratory effort normal. Cardiovascular system: S1 & S2 heard, RRR. No murmurs, rubs, gallops or clicks. Gastrointestinal system: Abdomen is distended, soft and nontender. No  organomegaly or masses felt. Normal bowel sounds heard. Central nervous system: Alert and oriented. No focal neurological deficits. Musculoskeletal: BLE 2+ pitting edema. No calf tenderness Skin: No cyanosis. No rashes Psychiatry: Judgement  and insight appear normal. Blunt affect     Data Reviewed: I have personally reviewed following labs and imaging studies  CBC Lab Results  Component Value Date   WBC 13.3 (H) 07/15/2021   RBC 2.42 (L) 07/15/2021   HGB 7.8 (L) 07/15/2021   HCT 23.5 (L) 07/15/2021   MCV 97.1 07/15/2021   MCH 32.2 07/15/2021   PLT 72 (L) 07/15/2021   MCHC 33.2 07/15/2021   RDW 25.8 (H) 07/15/2021   LYMPHSABS 1.0 07/11/2021   MONOABS 1.5 (H) 07/11/2021   EOSABS 0.1 07/11/2021   BASOSABS 0.1 07/11/2021     Last metabolic panel Lab Results  Component Value Date   NA 128 (L) 07/14/2021   K 4.4 07/14/2021   CL 104 07/14/2021   CO2 19 (L) 07/14/2021   BUN 10 07/14/2021   CREATININE 0.84 07/14/2021   GLUCOSE 100 (H) 07/14/2021   GFRNONAA >60 07/14/2021   CALCIUM 7.7 (L) 07/14/2021   PHOS 2.6 06/19/2021   PROT 6.7 07/14/2021   ALBUMIN 1.3 (L) 07/14/2021   BILITOT 15.4 (H) 07/14/2021   ALKPHOS 151 (H) 07/14/2021   AST 76 (H) 07/14/2021   ALT 33 07/14/2021   ANIONGAP 5 07/14/2021    CBG (last 3)  No results for input(s): GLUCAP in the last 72 hours.   GFR: Estimated Creatinine Clearance: 79.2 mL/min (by C-G formula based on SCr of 0.84 mg/dL).  Coagulation Profile: Recent Labs  Lab 07/08/21 1035 07/09/21 1023 07/11/21 1707 07/14/21 1354  INR 2.6* 2.6* 2.5* 2.7*     Recent Results (from the past 240 hour(s))  SARS CORONAVIRUS 2 (TAT 6-24 HRS) Nasopharyngeal Nasopharyngeal Swab     Status: None   Collection Time: 07/12/21  4:45 AM   Specimen: Nasopharyngeal Swab  Result Value Ref Range Status   SARS Coronavirus 2 NEGATIVE NEGATIVE Final    Comment: (NOTE) SARS-CoV-2 target nucleic acids are NOT DETECTED.  The SARS-CoV-2 RNA is generally detectable in upper and lower respiratory specimens during the acute phase of infection. Negative results do not preclude SARS-CoV-2 infection, do not rule out co-infections with other pathogens, and should not be used as the sole basis  for treatment or other patient management decisions. Negative results must be combined with clinical observations, patient history, and epidemiological information. The expected result is Negative.  Fact Sheet for Patients: HairSlick.no  Fact Sheet for Healthcare Providers: quierodirigir.com  This test is not yet approved or cleared by the [place] FDA and  has been authorized for detection and/or diagnosis of SARS-CoV-2 by FDA under an Emergency Use Authorization (EUA). This EUA will remain  in effect (meaning this test can be used) for the duration of the COVID-19 declaration under Se ction 564(b)(1) of the Act, 21 U.S.C. section 360bbb-3(b)(1), unless the authorization is terminated or revoked sooner.  Performed at [facility], [address] [place], [place] [address]   MRSA Next Gen by PCR, Nasal     Status: None   Collection Time: 07/12/21  8:14 PM   Specimen: Nasal Mucosa; Nasal Swab  Result Value Ref Range Status   MRSA by PCR Next Gen NOT DETECTED NOT DETECTED Final    Comment: (NOTE) The GeneXpert MRSA Assay (FDA approved for NASAL specimens only), is one component of a comprehensive MRSA colonization  surveillance program. It is not intended to diagnose MRSA infection nor to guide or monitor treatment for MRSA infections. Test performance is not FDA approved in patients less than 106 years old. Performed at [facility], [address], [place], [place] [address]          Radiology Studies: US Abdomen Limited  Result Date: 07/13/2021 CLINICAL DATA:  History of cirrhosis.  Assess for ascites. EXAM: LIMITED ABDOMEN ULTRASOUND FOR ASCITES TECHNIQUE: Limited ultrasound survey for ascites was performed in all four abdominal quadrants. COMPARISON:  06/18/2021 FINDINGS: Small amount of ascites noted adjacent to the cirrhotic liver and in both lower quadrants. IMPRESSION: Small amount of ascites.  Electronically Signed   By: Amie Portland M.D.   On: 07/13/2021 14:01        Scheduled Meds:  folic acid  1 mg Oral Daily   lactulose  10 g Oral BID   magnesium oxide  800 mg Oral BID   multivitamin with minerals  1 tablet Oral Daily   thiamine  100 mg Oral Daily   Continuous Infusions:     LOS: 4 days     Jacquelin Hawking, MD [facility] 07/15/2021, 9:51 AM  If 7PM-7AM, please contact night-coverage [email or web address]

## 2021-07-16 LAB — PREPARE FRESH FROZEN PLASMA

## 2021-07-16 LAB — CBC
HCT: 21.2 % — ABNORMAL LOW (ref 36.0–46.0)
Hemoglobin: 7.1 g/dL — ABNORMAL LOW (ref 12.0–15.0)
MCH: 33 pg (ref 26.0–34.0)
MCHC: 33.5 g/dL (ref 30.0–36.0)
MCV: 98.6 fL (ref 80.0–100.0)
Platelets: 70 10*3/uL — ABNORMAL LOW (ref 150–400)
RBC: 2.15 MIL/uL — ABNORMAL LOW (ref 3.87–5.11)
RDW: 25.9 % — ABNORMAL HIGH (ref 11.5–15.5)
WBC: 13.3 10*3/uL — ABNORMAL HIGH (ref 4.0–10.5)
nRBC: 0 % (ref 0.0–0.2)

## 2021-07-16 LAB — BPAM FFP
Blood Product Expiration Date: 202208272359
Blood Product Expiration Date: 202208272359
ISSUE DATE / TIME: 202208221359
ISSUE DATE / TIME: 202208221600
Unit Type and Rh: 7300
Unit Type and Rh: 7300

## 2021-07-16 LAB — PROTIME-INR
INR: 2.6 — ABNORMAL HIGH (ref 0.8–1.2)
Prothrombin Time: 28 seconds — ABNORMAL HIGH (ref 11.4–15.2)

## 2021-07-16 LAB — HEMOGLOBIN AND HEMATOCRIT, BLOOD
HCT: 21.2 % — ABNORMAL LOW (ref 36.0–46.0)
Hemoglobin: 7.1 g/dL — ABNORMAL LOW (ref 12.0–15.0)

## 2021-07-16 MED ORDER — SODIUM CHLORIDE 0.9% IV SOLUTION: Freq: Once | INTRAVENOUS | Status: DC

## 2021-07-16 MED ORDER — LACTULOSE 10 GM/15ML PO SOLN
10.0000 g | Freq: Three times a day (TID) | ORAL | Status: DC
  Administered 2021-07-16 (×2): 10 g via ORAL
  Filled 2021-07-16 (×2): qty 15

## 2021-07-16 MED ORDER — VITAMIN K1 10 MG/ML IJ SOLN
5.0000 mg | Freq: Every day | INTRAVENOUS | Status: AC
  Administered 2021-07-16 – 2021-07-18 (×3): 5 mg via INTRAVENOUS
  Filled 2021-07-16 (×4): qty 0.5

## 2021-07-16 NOTE — Telephone Encounter (Signed)
Patient admitted in hospital.  °

## 2021-07-16 NOTE — Plan of Care (Signed)

## 2021-07-16 NOTE — Progress Notes (Signed)
Patient dressing on paracentesis site changed 2x today.

## 2021-07-16 NOTE — Progress Notes (Signed)
Patient abdominal site dressing soaked with blood and drained onto her blanket. Redressed site has slight oozing blood. Dr. Caleb Popp aware.

## 2021-07-16 NOTE — Progress Notes (Signed)
Dressing changed on paracentisis site. Bright red blood noted leaking from incision site. Dressing and bed pad saturated with bright red blood.

## 2021-07-16 NOTE — Progress Notes (Signed)
PROGRESS NOTE    Jacey Eckerson  PRF:[id number] DOB: [birth year] DOA: 07/11/2021 PCP: Pcp, No   Brief Narrative: Huong Luthi is a 35 y.o. female with a history of alcohol abuse, alcoholic liver cirrhosis with recent decompensated liver disease. Patient presented secondary to oozing from paracentesis site and found to have significant blood loss anemia requiring blood transfusion in setting of coagulopathy from liver disease.   Assessment & Plan:   Principal Problem:   Acute blood loss anemia Active Problems:   Acute on chronic anemia   Encephalopathy, hepatic (HCC)   Cirrhosis (HCC)   Wound dehiscence   Acute blood loss anemia Secondary to hemorrhage from paracentesis site. Baseline hemoglobin of about 7.5-8. Hemoglobin of 6.2 on admission. Complicated by coagulopathy and thrombocytopenia. 1 unit of PRBC transfused on 8/18. When examined today, I noticed current oozing from her paracentesis site which eventually resolved after several minutes of pressure. Hemoglobin back down to 6.2 post  initial 1 unit transfusion. 2 units of PRBC transfused on 8/20 with post-transfusion hemoglobin of 8.4 and stable. Still bleeding today. Homeostasis achieved by nursing after several minutes of pressure. Dermabond applied without good affect. Continues to bleed. 2 units FFP transfused on 8/22 -FFP 2 units today -Daily CBC, INR -Patient will need removal of stitch likely on 8/27 to allow 10 days  AKI Mild. Likely secondary to blood loss. Resolved.  Coagulopathy Secondary to liver disease. INR of 2.5 on admission. Treated with vitamin K 5 mg IV secondary to hemorrhage. GI ordered several runs of vitamin K for continued bleeding. FFP as mentioned above. INR continues to be stable.  Alcoholic liver cirrhosis Decompensated liver disease Secondary to alcohol use. Follows with Gastroenterology. Currently on furosemide and spironolactone as an outpatient. Maddrey's discriminate factor of >90.  Ultrasound with small ascites. No concern for SBP. -GI recommendations: signed off, outpatient follow-up -2 gram sodium diet -CMP in AM  Elevated ammonia Ammonia of 81. No obvious evidence of encephalopathy. Patient states she is having multiple runny stools -Increase back to lactulose 10 g TID for now  History of alcohol abuse Per patient, she has stopped drinking. On thiamine and folic acid as an outpatient  Leukocytosis Chronic. Stable.  Thrombocytopenia Chronic. Trended down slightly.  DVT prophylaxis: SCDs Code Status:   Code Status: Full Code Family Communication: None at bedside Disposition Plan: Discharge pending resolution of bleed and stability of hemoglobin   Consultants:  Gastroenterology  Procedures:  None  Antimicrobials: None    Subjective: No bleeding all of yesterday after FFP but then started bleeding again this morning soaking multiple gauze dressings. Hemoglobin dropped.  Objective: Vitals:   07/16/21 0357 07/16/21 0400 07/16/21 0711 07/16/21 1028  BP: 108/69  92/63 101/69  Pulse: (!) 105  98 99  Resp: 14  20 19   Temp: 98.4 F (36.9 C)  98.2 F (36.8 C) 98.2 F (36.8 C)  TempSrc: Oral  Oral Oral  SpO2: 95%  100% 97%  Weight:  61.6 kg    Height:        Intake/Output Summary (Last 24 hours) at 07/16/2021 1046 Last data filed at 07/15/2021 2331 Gross per 24 hour  Intake 656 ml  Output 1 ml  Net 655 ml    Filed Weights   07/14/21 0512 07/15/21 0200 07/16/21 0400  Weight: 61.6 kg 60.9 kg 61.6 kg    Examination:  General exam: Appears calm and comfortable Respiratory system: Clear to auscultation. Respiratory effort normal. Cardiovascular system: S1 & S2 heard, RRR. Systolic  murmur Gastrointestinal system: Abdomen is distended, soft and nontender. No organomegaly or masses felt. Normal bowel sounds heard. Newly applied dressing is clean. Central nervous system: Alert and oriented. No focal neurological deficits. Musculoskeletal: No  edema. No calf tenderness Skin: No cyanosis. No rashes Psychiatry: Judgement and insight appear normal. Mood & affect appropriate.     Data Reviewed: I have personally reviewed following labs and imaging studies  CBC Lab Results  Component Value Date   WBC 13.3 (H) 07/16/2021   RBC 2.15 (L) 07/16/2021   HGB 7.1 (L) 07/16/2021   HCT 21.2 (L) 07/16/2021   MCV 98.6 07/16/2021   MCH 33.0 07/16/2021   PLT 70 (L) 07/16/2021   MCHC 33.5 07/16/2021   RDW 25.9 (H) 07/16/2021   LYMPHSABS 1.0 07/11/2021   MONOABS 1.5 (H) 07/11/2021   EOSABS 0.1 07/11/2021   BASOSABS 0.1 07/11/2021     Last metabolic panel Lab Results  Component Value Date   NA 130 (L) 07/15/2021   K 4.5 07/15/2021   CL 107 07/15/2021   CO2 19 (L) 07/15/2021   BUN 9 07/15/2021   CREATININE 0.86 07/15/2021   GLUCOSE 107 (H) 07/15/2021   GFRNONAA >60 07/15/2021   CALCIUM 7.7 (L) 07/15/2021   PHOS 2.6 06/19/2021   PROT 6.9 07/15/2021   ALBUMIN 1.5 (L) 07/15/2021   BILITOT 17.0 (H) 07/15/2021   ALKPHOS 140 (H) 07/15/2021   AST 79 (H) 07/15/2021   ALT 32 07/15/2021   ANIONGAP 4 (L) 07/15/2021    CBG (last 3)  No results for input(s): GLUCAP in the last 72 hours.   GFR: Estimated Creatinine Clearance: 77.3 mL/min (by C-G formula based on SCr of 0.86 mg/dL).  Coagulation Profile: Recent Labs  Lab 07/11/21 1707 07/14/21 1354 07/15/21 1651  INR 2.5* 2.7* 2.4*     Recent Results (from the past 240 hour(s))  SARS CORONAVIRUS 2 (TAT 6-24 HRS) Nasopharyngeal Nasopharyngeal Swab     Status: None   Collection Time: 07/12/21  4:45 AM   Specimen: Nasopharyngeal Swab  Result Value Ref Range Status   SARS Coronavirus 2 NEGATIVE NEGATIVE Final    Comment: (NOTE) SARS-CoV-2 target nucleic acids are NOT DETECTED.  The SARS-CoV-2 RNA is generally detectable in upper and lower respiratory specimens during the acute phase of infection. Negative results do not preclude SARS-CoV-2 infection, do not rule  out co-infections with other pathogens, and should not be used as the sole basis for treatment or other patient management decisions. Negative results must be combined with clinical observations, patient history, and epidemiological information. The expected result is Negative.  Fact Sheet for Patients: HairSlick.no  Fact Sheet for Healthcare Providers: quierodirigir.com  This test is not yet approved or cleared by the [place] FDA and  has been authorized for detection and/or diagnosis of SARS-CoV-2 by FDA under an Emergency Use Authorization (EUA). This EUA will remain  in effect (meaning this test can be used) for the duration of the COVID-19 declaration under Se ction 564(b)(1) of the Act, 21 U.S.C. section 360bbb-3(b)(1), unless the authorization is terminated or revoked sooner.  Performed at [facility], [address] [place], [place] [address]   MRSA Next Gen by PCR, Nasal     Status: None   Collection Time: 07/12/21  8:14 PM   Specimen: Nasal Mucosa; Nasal Swab  Result Value Ref Range Status   MRSA by PCR Next Gen NOT DETECTED NOT DETECTED Final    Comment: (NOTE) The GeneXpert MRSA Assay (FDA  approved for NASAL specimens only), is one component of a comprehensive MRSA colonization surveillance program. It is not intended to diagnose MRSA infection nor to guide or monitor treatment for MRSA infections. Test performance is not FDA approved in patients less than 64 years old. Performed at [facility], [address] [place], [place] [address]          Radiology Studies: No results found.      Scheduled Meds:  folic acid  1 mg Oral Daily   lactulose  10 g Oral BID   magnesium oxide  800 mg Oral BID   multivitamin with minerals  1 tablet Oral Daily   thiamine  100 mg Oral Daily   Continuous Infusions:     LOS: 5 days     Jacquelin Hawking, MD [facility] 07/16/2021,  10:46 AM  If 7PM-7AM, please contact night-coverage [email or web address]

## 2021-07-16 NOTE — Progress Notes (Signed)
Dressing change on paracentesis site, blood soaked through dressing and bed pad.

## 2021-07-17 LAB — CBC
HCT: 20.3 % — ABNORMAL LOW (ref 36.0–46.0)
Hemoglobin: 6.5 g/dL — CL (ref 12.0–15.0)
MCH: 32.2 pg (ref 26.0–34.0)
MCHC: 32 g/dL (ref 30.0–36.0)
MCV: 100.5 fL — ABNORMAL HIGH (ref 80.0–100.0)
Platelets: 73 10*3/uL — ABNORMAL LOW (ref 150–400)
RBC: 2.02 MIL/uL — ABNORMAL LOW (ref 3.87–5.11)
RDW: 25.9 % — ABNORMAL HIGH (ref 11.5–15.5)
WBC: 13 10*3/uL — ABNORMAL HIGH (ref 4.0–10.5)
nRBC: 0.2 % (ref 0.0–0.2)

## 2021-07-17 LAB — BPAM FFP
Blood Product Expiration Date: 202208282359
Blood Product Expiration Date: 202208282359
ISSUE DATE / TIME: 202208231434
ISSUE DATE / TIME: 202208231627
Unit Type and Rh: 7300
Unit Type and Rh: 7300

## 2021-07-17 LAB — PREPARE FRESH FROZEN PLASMA

## 2021-07-17 LAB — PREPARE RBC (CROSSMATCH)

## 2021-07-17 MED ORDER — LACTULOSE 10 GM/15ML PO SOLN
10.0000 g | Freq: Every day | ORAL | Status: DC
  Administered 2021-07-17 – 2021-07-26 (×9): 10 g via ORAL
  Filled 2021-07-17 (×9): qty 15

## 2021-07-17 MED ORDER — SODIUM CHLORIDE 0.9% IV SOLUTION: Freq: Once | INTRAVENOUS | Status: DC

## 2021-07-17 MED ORDER — ONDANSETRON HCL 4 MG/2ML IJ SOLN
4.0000 mg | Freq: Four times a day (QID) | INTRAMUSCULAR | Status: DC | PRN
  Administered 2021-07-20: 4 mg via INTRAVENOUS
  Filled 2021-07-17: qty 2

## 2021-07-17 NOTE — Progress Notes (Signed)
Patient refused lab work this am. Lab will attempt to collect later this am.

## 2021-07-17 NOTE — Progress Notes (Addendum)
PROGRESS NOTE    Michelle Little  BTD:[id number] DOB: 1986/09/27 DOA: 07/11/2021 PCP: Pcp, No   Brief Narrative: Michelle Little is a 35 y.o. female with a history of alcohol abuse, alcoholic liver cirrhosis with recent decompensated liver disease. Patient presented to the hospital secondary to oozing from paracentesis site and found to have significant blood loss anemia requiring blood transfusion in setting of coagulopathy from liver disease.  Assessment & Plan:   Principal Problem:   Acute blood loss anemia Active Problems:   Acute on chronic anemia   Encephalopathy, hepatic (HCC)   Cirrhosis (HCC)   Wound dehiscence   Acute blood loss anemia Secondary to hemorrhage from paracentesis site.  Baseline hemoglobin of around 7.5-8.  Hemoglobin was 6.2 on presentation.  She has received blood transfusion during hospitalization but continues to have episodes of wheezing.  Dermabond has been applied.  Received 2 units of FFP as well.  Patient does have a stitch which is to be removed on 07/20/21.  Patient received blood transfusion and her hemoglobin was done yesterday.  Has trended down to 6.5.  Will transfuse 1 unit of packed RBC.  AKI Mild. Likely secondary to blood loss. Resolved.  Coagulopathy secondary to liver disease.  INR of 2.5 on admission.  Latest INR of 2.4.  Getting vitamin K and FFP.    Alcoholic liver cirrhosis with decompensated liver disease on furosemide and spironolactone as an outpatient.  Ultrasound with small ascites. No concern for SBP.  GI has signed off at this time.  We will continue to monitor as outpatient.  Elevated ammonia Morning was elevated.  No encephalopathy.  We will decrease the dose of lactulose to 10 g daily titrate for bowel movements more than 3 times per day.  History of alcohol abuse Has not been drinking recently.  Continue thiamine and folic acid.  Leukocytosis Chronic. Stable.  Latest WBC count of 13.3.  Thrombocytopenia Chronic.  Trended down slightly.  Latest platelet count of 70.  DVT prophylaxis:  SCDs  Code Status: Full code  Family Communication:  None   Disposition Plan:  Home likely in 1 to 2 days.  We will get transition of care consultation for medication assistance, PCP follow-up.  Patient is currently living at a shelter.   Consultants:  GI.  Procedures:  None  Antimicrobials: None   Subjective: Today, patient was seen and examined at bedside.  Complains of frequent nonbloody stools  3-4 times a day.  No nausea vomiting or fever  Objective: Vitals:   07/16/21 2314 07/17/21 0300 07/17/21 0439 07/17/21 0727  BP: 100/69  101/63 93/62  Pulse: 96  (!) 106 98  Resp: 18 19 15 19   Temp: 98.5 F (36.9 C)  98.3 F (36.8 C) 98 F (36.7 C)  TempSrc: Oral  Oral Oral  SpO2: 96%  90% 100%  Weight:   60.9 kg   Height:        Intake/Output Summary (Last 24 hours) at 07/17/2021 0950 Last data filed at 07/17/2021 0600 Gross per 24 hour  Intake 527 ml  Output --  Net 527 ml    Filed Weights   07/15/21 0200 07/16/21 0400 07/17/21 0439  Weight: 60.9 kg 61.6 kg 60.9 kg    Physical examination:  General:  Average built, not in obvious distress HENT:   Mild pallor and deep icterus noted.  Oral mucosa is moist.  Chest:  Clear breath sounds.  Diminished breath sounds bilaterally. No crackles or wheezes.  CVS: S1 &S2  heard.  Systolic murmur noted. Abdomen: Soft, nontender, nondistended.  Bowel sounds are heard.  Dressing in the abdomen without much soakage. Extremities: No cyanosis, clubbing but with bilateral lower extremity gross pitting edema noted, peripheral pulses are palpable. Psych: Alert, awake and oriented, normal mood CNS:  No cranial nerve deficits.  Power equal in all extremities.   Skin: Warm and dry.  No rashes noted.  Data Reviewed: I have personally reviewed following labs and imaging studies  CBC Lab Results  Component Value Date   WBC 13.3 (H) 07/16/2021   RBC 2.15 (L)  07/16/2021   HGB 7.1 (L) 07/16/2021   HCT 21.2 (L) 07/16/2021   MCV 98.6 07/16/2021   MCH 33.0 07/16/2021   PLT 70 (L) 07/16/2021   MCHC 33.5 07/16/2021   RDW 25.9 (H) 07/16/2021   LYMPHSABS 1.0 07/11/2021   MONOABS 1.5 (H) 07/11/2021   EOSABS 0.1 07/11/2021   BASOSABS 0.1 07/11/2021     Last metabolic panel Lab Results  Component Value Date   NA 130 (L) 07/15/2021   K 4.5 07/15/2021   CL 107 07/15/2021   CO2 19 (L) 07/15/2021   BUN 9 07/15/2021   CREATININE 0.86 07/15/2021   GLUCOSE 107 (H) 07/15/2021   GFRNONAA >60 07/15/2021   CALCIUM 7.7 (L) 07/15/2021   PHOS 2.6 06/19/2021   PROT 6.9 07/15/2021   ALBUMIN 1.5 (L) 07/15/2021   BILITOT 17.0 (H) 07/15/2021   ALKPHOS 140 (H) 07/15/2021   AST 79 (H) 07/15/2021   ALT 32 07/15/2021   ANIONGAP 4 (L) 07/15/2021    CBG (last 3)  No results for input(s): GLUCAP in the last 72 hours.   GFR: Estimated Creatinine Clearance: 77.3 mL/min (by C-G formula based on SCr of 0.86 mg/dL).  Coagulation Profile: Recent Labs  Lab 07/11/21 1707 07/14/21 1354 07/15/21 1651 07/16/21 1204  INR 2.5* 2.7* 2.4* 2.6*     Recent Results (from the past 240 hour(s))  SARS CORONAVIRUS 2 (TAT 6-24 HRS) Nasopharyngeal Nasopharyngeal Swab     Status: None   Collection Time: 07/12/21  4:45 AM   Specimen: Nasopharyngeal Swab  Result Value Ref Range Status   SARS Coronavirus 2 NEGATIVE NEGATIVE Final    Comment: (NOTE) SARS-CoV-2 target nucleic acids are NOT DETECTED.  The SARS-CoV-2 RNA is generally detectable in upper and lower respiratory specimens during the acute phase of infection. Negative results do not preclude SARS-CoV-2 infection, do not rule out co-infections with other pathogens, and should not be used as the sole basis for treatment or other patient management decisions. Negative results must be combined with clinical observations, patient history, and epidemiological information. The expected result is Negative.  Fact  Sheet for Patients: HairSlick.no  Fact Sheet for Healthcare Providers: quierodirigir.com  This test is not yet approved or cleared by the [place] FDA and  has been authorized for detection and/or diagnosis of SARS-CoV-2 by FDA under an Emergency Use Authorization (EUA). This EUA will remain  in effect (meaning this test can be used) for the duration of the COVID-19 declaration under Se ction 564(b)(1) of the Act, 21 U.S.C. section 360bbb-3(b)(1), unless the authorization is terminated or revoked sooner.  Performed at [facility], [address] [place], [place] [address]   MRSA Next Gen by PCR, Nasal     Status: None   Collection Time: 07/12/21  8:14 PM   Specimen: Nasal Mucosa; Nasal Swab  Result Value Ref Range Status   MRSA by PCR Next Gen NOT  DETECTED NOT DETECTED Final    Comment: (NOTE) The GeneXpert MRSA Assay (FDA approved for NASAL specimens only), is one component of a comprehensive MRSA colonization surveillance program. It is not intended to diagnose MRSA infection nor to guide or monitor treatment for MRSA infections. Test performance is not FDA approved in patients less than 57 years old. Performed at [facility], [address], [place], [place] [address]        Radiology Studies: No results found.   Scheduled Meds:  sodium chloride   Intravenous Once   folic acid  1 mg Oral Daily   lactulose  10 g Oral Daily   magnesium oxide  800 mg Oral BID   multivitamin with minerals  1 tablet Oral Daily   thiamine  100 mg Oral Daily   Continuous Infusions:  phytonadione (VITAMIN K) IV 5 mg (07/16/21 1824)      LOS: 6 days    Joycelyn Das, MD [facility] 07/17/2021, 9:50 AM  If 7PM-7AM, please contact night-coverage [email or web address]

## 2021-07-17 NOTE — Progress Notes (Signed)
RN called phlebotomy for patients am labs to be drawn. Phlebotomy informed RN that the pts lab work was completed but has not resulted.

## 2021-07-17 NOTE — Plan of Care (Signed)

## 2021-07-18 LAB — COMPREHENSIVE METABOLIC PANEL
ALT: 27 U/L (ref 0–44)
AST: 70 U/L — ABNORMAL HIGH (ref 15–41)
Albumin: 1.5 g/dL — ABNORMAL LOW (ref 3.5–5.0)
Alkaline Phosphatase: 148 U/L — ABNORMAL HIGH (ref 38–126)
Anion gap: 3 — ABNORMAL LOW (ref 5–15)
BUN: 7 mg/dL (ref 6–20)
CO2: 19 mmol/L — ABNORMAL LOW (ref 22–32)
Calcium: 7.7 mg/dL — ABNORMAL LOW (ref 8.9–10.3)
Chloride: 107 mmol/L (ref 98–111)
Creatinine, Ser: 0.77 mg/dL (ref 0.44–1.00)
GFR, Estimated: >60 mL/min (ref >60)
Glucose, Bld: 105 mg/dL — ABNORMAL HIGH (ref 70–99)
Potassium: 4.3 mmol/L (ref 3.5–5.1)
Sodium: 129 mmol/L — ABNORMAL LOW (ref 135–145)
Total Bilirubin: 17.5 mg/dL — ABNORMAL HIGH (ref 0.3–1.2)
Total Protein: 6.8 g/dL (ref 6.5–8.1)

## 2021-07-18 LAB — CBC
HCT: 23 % — ABNORMAL LOW (ref 36.0–46.0)
Hemoglobin: 7.8 g/dL — ABNORMAL LOW (ref 12.0–15.0)
MCH: 33.1 pg (ref 26.0–34.0)
MCHC: 33.9 g/dL (ref 30.0–36.0)
MCV: 97.5 fL (ref 80.0–100.0)
Platelets: 70 10*3/uL — ABNORMAL LOW (ref 150–400)
RBC: 2.36 MIL/uL — ABNORMAL LOW (ref 3.87–5.11)
RDW: 24.1 % — ABNORMAL HIGH (ref 11.5–15.5)
WBC: 12.9 10*3/uL — ABNORMAL HIGH (ref 4.0–10.5)
nRBC: 0.4 % — ABNORMAL HIGH (ref 0.0–0.2)

## 2021-07-18 LAB — PROTIME-INR
INR: 2.3 — ABNORMAL HIGH (ref 0.8–1.2)
Prothrombin Time: 25 seconds — ABNORMAL HIGH (ref 11.4–15.2)

## 2021-07-18 LAB — MAGNESIUM: Magnesium: 2 mg/dL (ref 1.7–2.4)

## 2021-07-18 LAB — PHOSPHORUS: Phosphorus: 4.3 mg/dL (ref 2.5–4.6)

## 2021-07-18 MED ORDER — SILVER NITRATE-POT NITRATE 75-25 % EX MISC
1.0000 "application " | CUTANEOUS | Status: DC | PRN
  Administered 2021-07-19 – 2021-07-24 (×5): 1 via TOPICAL
  Filled 2021-07-18 (×6): qty 1

## 2021-07-18 MED ORDER — SODIUM CHLORIDE 0.9% IV SOLUTION: Freq: Once | INTRAVENOUS | Status: DC

## 2021-07-18 NOTE — Progress Notes (Signed)
Patient expressed concerns about caring for herself after she is discharged. She would like help with a Medicaid application and resources to allow her to manage her liver disease/ attend her appointments. Will pass on to follow up with case management/ social work for assistance.

## 2021-07-18 NOTE — Plan of Care (Signed)

## 2021-07-18 NOTE — Progress Notes (Addendum)
PROGRESS NOTE    Michelle Little  UMP:[id number] DOB: November 03, 1986 DOA: 07/11/2021 PCP: Pcp, No   Brief Narrative: Michelle Little is a 35 y.o. 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 During hospitalization, patient has received vitamin K and FFP's but despite that and with pressure dressing she continues to ooze from the local site.  Assessment & Plan:   Principal Problem:   Acute blood loss anemia Active Problems:   Acute on chronic anemia   Encephalopathy, hepatic (HCC)   Cirrhosis (HCC)   Wound dehiscence  Acute blood loss anemia Secondary to hemorrhage from paracentesis site.  Still continues to ooze blood.  Baseline hemoglobin of around 7.5-8.  Hemoglobin was 6.2 on presentation.  Hemoglobin of 7.4 today.  She has received blood transfusion during hospitalization but continues to have episodes of blood loss from oozing.  Status post sutures placed in the ED.  Has received FFP, PRBC and vitamin K.  Spoke with general surgery on-call regarding the situation on 07/18/2021 and recommended silver nitrate if oozing.  Since patient is still losing and has a blood clot we will give her 1 unit of FFP today and continue with vitamin K orally.  Hyponatremia.  Secondary to liver disease.  We will continue to monitor.  Sodium of 129  AKI Mild.  Resolved.  Latest creatinine of 0.7  Coagulopathy secondary to liver disease.  INR of 2.5 on admission.  Latest INR of 2.6  Getting vitamin K and has been receiving FFP intermittently as well.  Alcoholic liver cirrhosis with decompensated liver disease on furosemide and spironolactone as an outpatient.  No concern for SBP.  GI has signed off at this time.  We will continue to monitor as  outpatient.  Elevated ammonia  No encephalopathy.  Due to diarrhea, lactulose dose has been decreased.  History of alcohol abuse Has not been drinking recently.  Continue thiamine and folic acid.  Leukocytosis Chronic. Stable.  Latest WBC count of 12.2  Thrombocytopenia Chronic. Trended down slightly.  Latest platelet count of 75.  DVT prophylaxis:  SCDs  Code Status:  Full code  Family Communication:  None   Disposition Plan:  Home when bleeding stops.  Transition of care on board for transition,disposition and medication assistance.  Patient is currently at a shelter.  Consultants:  GI.  Procedures:  PRBC/ FFP transfusion  Antimicrobials: None   Subjective: Today, patient was seen and examined at bedside.  Feels very fatigued and tired.  Has some blood tinged to the dressing and a large clot adjacent to it.  Spoke with the nursing staff to apply silver nitrate to the oozing wound.  Objective: Vitals:   07/18/21 0300 07/18/21 0334 07/18/21 0500 07/18/21 0741  BP:  98/66  100/69  Pulse:  97  (!) 101  Resp: 19 20  (!) 21  Temp:  98.4 F (36.9 C)  98 F (36.7 C)  TempSrc:  Oral  Oral  SpO2:  98%  97%  Weight:   61.9 kg   Height:        Intake/Output Summary (Last 24 hours) at 07/18/2021 1109 Last data filed at 07/17/2021 1917 Gross per 24 hour  Intake 346 ml  Output --  Net 346 ml    Filed Weights   07/16/21 0400 07/17/21  3893 07/18/21 0500  Weight: 61.6 kg 60.9 kg 61.9 kg    Physical examination:  General:  Average built, not in obvious distress HENT:   Pallor and icterus noted. Chest:  Clear breath sounds.  Diminished breath sounds bilaterally. No crackles or wheezes.  CVS: S1 &S2 heard.  Systolic murmur noted. Abdomen: Soft, nontender, nondistended.  Bowel sounds are heard.  Dressing in the abdomen with bloody tinge.  Extremities: No cyanosis, clubbing but with bilateral lower extremity gross pitting edema noted, peripheral pulses are  palpable. Psych: Alert, awake and oriented, normal mood CNS:  No cranial nerve deficits.  Power equal in all extremities.   Skin: Warm and dry.  No rashes noted.  Data Reviewed: I have personally reviewed following labs and imaging studies  CBC Lab Results  Component Value Date   WBC 12.9 (H) 07/18/2021   RBC 2.36 (L) 07/18/2021   HGB 7.8 (L) 07/18/2021   HCT 23.0 (L) 07/18/2021   MCV 97.5 07/18/2021   MCH 33.1 07/18/2021   PLT 70 (L) 07/18/2021   MCHC 33.9 07/18/2021   RDW 24.1 (H) 07/18/2021   LYMPHSABS 1.0 07/11/2021   MONOABS 1.5 (H) 07/11/2021   EOSABS 0.1 07/11/2021   BASOSABS 0.1 07/11/2021     Last metabolic panel Lab Results  Component Value Date   NA 129 (L) 07/18/2021   K 4.3 07/18/2021   CL 107 07/18/2021   CO2 19 (L) 07/18/2021   BUN 7 07/18/2021   CREATININE 0.77 07/18/2021   GLUCOSE 105 (H) 07/18/2021   GFRNONAA >60 07/18/2021   CALCIUM 7.7 (L) 07/18/2021   PHOS 4.3 07/18/2021   PROT 6.8 07/18/2021   ALBUMIN 1.5 (L) 07/18/2021   BILITOT 17.5 (H) 07/18/2021   ALKPHOS 148 (H) 07/18/2021   AST 70 (H) 07/18/2021   ALT 27 07/18/2021   ANIONGAP 3 (L) 07/18/2021    CBG (last 3)  No results for input(s): GLUCAP in the last 72 hours.   GFR: Estimated Creatinine Clearance: 83.1 mL/min (by C-G formula based on SCr of 0.77 mg/dL).  Coagulation Profile: Recent Labs  Lab 07/11/21 1707 07/14/21 1354 07/15/21 1651 07/16/21 1204 07/18/21 0046  INR 2.5* 2.7* 2.4* 2.6* 2.3*     Recent Results (from the past 240 hour(s))  SARS CORONAVIRUS 2 (TAT 6-24 HRS) Nasopharyngeal Nasopharyngeal Swab     Status: None   Collection Time: 07/12/21  4:45 AM   Specimen: Nasopharyngeal Swab  Result Value Ref Range Status   SARS Coronavirus 2 NEGATIVE NEGATIVE Final    Comment: (NOTE) SARS-CoV-2 target nucleic acids are NOT DETECTED.  The SARS-CoV-2 RNA is generally detectable in upper and lower respiratory specimens during the acute phase of infection.  Negative results do not preclude SARS-CoV-2 infection, do not rule out co-infections with other pathogens, and should not be used as the sole basis for treatment or other patient management decisions. Negative results must be combined with clinical observations, patient history, and epidemiological information. The expected result is Negative.  Fact Sheet for Patients: HairSlick.no  Fact Sheet for Healthcare Providers: quierodirigir.com  This test is not yet approved or cleared by the [place] FDA and  has been authorized for detection and/or diagnosis of SARS-CoV-2 by FDA under an Emergency Use Authorization (EUA). This EUA will remain  in effect (meaning this test can be used) for the duration of the COVID-19 declaration under Se ction 564(b)(1) of the Act, 21 U.S.C. section 360bbb-3(b)(1), unless the authorization is terminated or revoked sooner.  Performed at  [facility], [address], [place], [place] [address]   MRSA Next Gen by PCR, Nasal     Status: None   Collection Time: 07/12/21  8:14 PM   Specimen: Nasal Mucosa; Nasal Swab  Result Value Ref Range Status   MRSA by PCR Next Gen NOT DETECTED NOT DETECTED Final    Comment: (NOTE) The GeneXpert MRSA Assay (FDA approved for NASAL specimens only), is one component of a comprehensive MRSA colonization surveillance program. It is not intended to diagnose MRSA infection nor to guide or monitor treatment for MRSA infections. Test performance is not FDA approved in patients less than 75 years old. Performed at [facility], [address], [place], [place] [address]       Radiology Studies: No results found.   Scheduled Meds:  sodium chloride   Intravenous Once   sodium chloride   Intravenous Once   sodium chloride   Intravenous Once   folic acid  1 mg Oral Daily   lactulose  10 g Oral Daily   magnesium oxide  800 mg Oral BID   multivitamin  with minerals  1 tablet Oral Daily   thiamine  100 mg Oral Daily   Continuous Infusions:  phytonadione (VITAMIN K) IV 5 mg (07/18/21 1022)      LOS: 7 days    Joycelyn Das, MD [facility] 07/18/2021, 11:09 AM  If 7PM-7AM, please contact night-coverage [email or web address]

## 2021-07-18 NOTE — Progress Notes (Signed)
   07/18/21 0741  Assess: MEWS Score  Temp 98 F (36.7 C)  BP 100/69  Pulse Rate (!) 101  ECG Heart Rate (!) 101  Resp (!) 21  Level of Consciousness Alert  SpO2 97 %  O2 Device Room Air  Patient Activity (if Appropriate) In bed  Assess: MEWS Score  MEWS Temp 0  MEWS Systolic 1  MEWS Pulse 1  MEWS RR 1  MEWS LOC 0  MEWS Score 3  MEWS Score Color Yellow  Assess: if the MEWS score is Yellow or Red  Were vital signs taken at a resting state? Yes  Focused Assessment No change from prior assessment  Early Detection of Sepsis Score *See Row Information* Medium  MEWS guidelines implemented *See Row Information* Yes  Treat  MEWS Interventions Escalated (See documentation below)  Pain Scale 0-10  Pain Score 0  Take Vital Signs  Increase Vital Sign Frequency  Yellow: Q 2hr X 2 then Q 4hr X 2, if remains yellow, continue Q 4hrs  Escalate  MEWS: Escalate Yellow: discuss with charge nurse/RN and consider discussing with provider and RRT  Notify: Charge Nurse/RN  Name of Charge Nurse/RN Notified [place]  Date Charge Nurse/RN Notified 07/18/21  Time Charge Nurse/RN Notified [id number]  Notify: Provider  Provider Name/Title Dr. Tyson Babinski  Date Provider Notified 07/18/21  Time Provider Notified [phone]  Notification Type Face-to-face  Notification Reason Other (Comment) (pt ozzing blood from paracentisis site.)  Provider response At bedside  Date of Provider Response 07/18/21  Time of Provider Response 0815    MD ordered plasma.

## 2021-07-18 NOTE — Progress Notes (Signed)
Asked to see patient for bleeding from paracentesis site that was done in July of 2022.  Apparently at home the patient picked off the scab over this site and has since required admission due to persistent bleeding.  Someone has placed a stitch and put dermabond over this site.  I was asked to see for any further recommendations.  Unfortunately, she has liver failure and her INR is 2.3 despite VitK and FFP.  Upon my arrival the site is dry and not bleeding.  Given it has a stitch already, no further stitch was placed as I did not want to make this bleed.  If this rebleeds, could try combat gauze or silver nitrate if desired, but no other surgical recommendations at this time.    Letha Cape 2:04 PM 07/18/2021

## 2021-07-19 DIAGNOSIS — R188 Other ascites: Secondary | ICD-10-CM

## 2021-07-19 LAB — BPAM FFP
Blood Product Expiration Date: 202208302359
ISSUE DATE / TIME: 202208251132
Unit Type and Rh: 7300

## 2021-07-19 LAB — PREPARE FRESH FROZEN PLASMA

## 2021-07-19 LAB — CBC
HCT: 22.9 % — ABNORMAL LOW (ref 36.0–46.0)
Hemoglobin: 7.4 g/dL — ABNORMAL LOW (ref 12.0–15.0)
MCH: 33.2 pg (ref 26.0–34.0)
MCHC: 32.3 g/dL (ref 30.0–36.0)
MCV: 102.7 fL — ABNORMAL HIGH (ref 80.0–100.0)
Platelets: 75 10*3/uL — ABNORMAL LOW (ref 150–400)
RBC: 2.23 MIL/uL — ABNORMAL LOW (ref 3.87–5.11)
RDW: 24.8 % — ABNORMAL HIGH (ref 11.5–15.5)
WBC: 12.2 10*3/uL — ABNORMAL HIGH (ref 4.0–10.5)
nRBC: 0 % (ref 0.0–0.2)

## 2021-07-19 LAB — PROTIME-INR
INR: 2.6 — ABNORMAL HIGH (ref 0.8–1.2)
Prothrombin Time: 28 seconds — ABNORMAL HIGH (ref 11.4–15.2)

## 2021-07-19 MED ORDER — PHYTONADIONE 5 MG PO TABS
10.0000 mg | ORAL_TABLET | Freq: Every day | ORAL | Status: DC
  Administered 2021-07-19 – 2021-07-26 (×7): 10 mg via ORAL
  Filled 2021-07-19 (×9): qty 2

## 2021-07-19 MED ORDER — SODIUM CHLORIDE 0.9% IV SOLUTION: Freq: Once | INTRAVENOUS | Status: AC

## 2021-07-19 NOTE — Plan of Care (Signed)
  Problem: Education: Goal: Knowledge of General Education information will improve Description: Including pain rating scale, medication(s)/side effects and non-pharmacologic comfort measures Outcome: Progressing   Problem: Health Behavior/Discharge Planning: Goal: Ability to manage health-related needs will improve Outcome: Progressing   Problem: Clinical Measurements: Goal: Will remain free from infection Outcome: Progressing   Problem: Pain Managment: Goal: General experience of comfort will improve Outcome: Progressing   Problem: Skin Integrity: Goal: Risk for impaired skin integrity will decrease Outcome: Progressing   

## 2021-07-19 NOTE — Progress Notes (Addendum)
PIV consult: Pt reports BUE feel sore from venipuncture. Bruising noted. Midline discussed for lab draw potential, pt declined placement at this time. Long 22g placed in forearm w US guidance. Please consider alternative venous access device if prolonged access is needed.

## 2021-07-20 LAB — PREPARE RBC (CROSSMATCH)

## 2021-07-20 LAB — PROTIME-INR
INR: 2.5 — ABNORMAL HIGH (ref 0.8–1.2)
Prothrombin Time: 27.2 seconds — ABNORMAL HIGH (ref 11.4–15.2)

## 2021-07-20 LAB — HEMOGLOBIN AND HEMATOCRIT, BLOOD
HCT: 25 % — ABNORMAL LOW (ref 36.0–46.0)
Hemoglobin: 8.4 g/dL — ABNORMAL LOW (ref 12.0–15.0)

## 2021-07-20 LAB — CBC
HCT: 20.9 % — ABNORMAL LOW (ref 36.0–46.0)
Hemoglobin: 6.8 g/dL — CL (ref 12.0–15.0)
MCH: 33 pg (ref 26.0–34.0)
MCHC: 32.5 g/dL (ref 30.0–36.0)
MCV: 101.5 fL — ABNORMAL HIGH (ref 80.0–100.0)
Platelets: 82 10*3/uL — ABNORMAL LOW (ref 150–400)
RBC: 2.06 MIL/uL — ABNORMAL LOW (ref 3.87–5.11)
RDW: 24.5 % — ABNORMAL HIGH (ref 11.5–15.5)
WBC: 12.7 10*3/uL — ABNORMAL HIGH (ref 4.0–10.5)
nRBC: 0.2 % (ref 0.0–0.2)

## 2021-07-20 LAB — PREPARE FRESH FROZEN PLASMA

## 2021-07-20 LAB — BPAM FFP
Blood Product Expiration Date: 202208312359
ISSUE DATE / TIME: 202208261822
Unit Type and Rh: 7300

## 2021-07-20 MED ORDER — SODIUM CHLORIDE 0.9% IV SOLUTION: Freq: Once | INTRAVENOUS | Status: AC

## 2021-07-20 NOTE — Plan of Care (Signed)

## 2021-07-20 NOTE — Progress Notes (Signed)
Cross-coverage note:   Hgb is 6.8, down from 7.4 yesterday. She continues to have slow bleeding from paracentesis site. Plan to transfuse 1 unit RBC.

## 2021-07-20 NOTE — Progress Notes (Signed)
PROGRESS NOTE    Michelle Little  IRC:[id number] DOB: Oct 25, 1986 DOA: 07/11/2021 PCP: Pcp, No   Brief Narrative: Michelle Little is a 35 y.o. 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 During hospitalization, patient has received vitamin K and FFP's but despite that and with pressure dressing she continues to ooze from the local site.  General surgery was consulted due to ongoing oozing who recommended silver nitrate but patient persists to have bleeding.  Assessment & Plan:   Principal Problem:   Acute blood loss anemia Active Problems:   Acute on chronic anemia   Encephalopathy, hepatic (HCC)   Cirrhosis (HCC)   Wound dehiscence  Acute blood loss anemia Secondary to hemorrhage from paracentesis site.  Still continues to ooze blood.  Baseline hemoglobin of around 7.5-8.  Hemoglobin was 6.2 on presentation.  Hemoglobin of 8.4 today after transfusion was 6.8 this morning.  Has been receiving FFP and vitamin K during hospitalization.  We will continue to monitor closely.  We will transfuse 1 more unit of FFP today.  We will put the patient on pressure dressing.    Hyponatremia.  Secondary to liver disease.  We will continue to monitor.  Sodium of 129  AKI Mild.  Resolved.  Latest creatinine of 0.7  Coagulopathy secondary to liver disease.  INR of 2.5 on admission.  Latest INR of 2.5  Getting vitamin K and has been receiving FFP intermittently as well.  Alcoholic liver cirrhosis with decompensated liver disease on furosemide and spironolactone as an outpatient.  Currently on lactulose.  No concern for SBP.  GI had seen the patient on this admission.  Elevated ammonia  No encephalopathy.  On decreased dose of lactulose  History of  alcohol abuse Has not been drinking recently.  Continue thiamine and folic acid.  Leukocytosis Chronic. Stable.    Thrombocytopenia Chronic, latest platelet count of 82K  DVT prophylaxis:  SCDs  Code Status:  Full code  Family Communication:  None   Disposition Plan:  Home when bleeding stops.  Transition of care on board for transition,disposition and medication assistance.  Patient is currently at a shelter.  Consultants:  GI.  Procedures:  PRBC/ FFP transfusion  Antimicrobials: None   Subjective: Today, patient was seen and examined at bedside continues to have a bleeding.  spoke with the nursing staff regarding pressure dressing   Objective: Vitals:   07/20/21 0248 07/20/21 0511 07/20/21 0627 07/20/21 0724  BP: 95/65 97/64  104/72  Pulse:  (!) 109    Resp: (!) 21 20  19   Temp:  98.5 F (36.9 C)    TempSrc:  Oral    SpO2:      Weight:   63.6 kg   Height:        Intake/Output Summary (Last 24 hours) at 07/20/2021 0752 Last data filed at 07/20/2021 0511 Gross per 24 hour  Intake 958 ml  Output --  Net 958 ml    Filed Weights   07/18/21 0500 07/19/21 0637 07/20/21 0627  Weight: 61.9 kg 61.4 kg 63.6 kg    Physical examination:  General:  Average built, not in obvious distress HENT: l pallor and icterus noted. Oral mucosa is moist.  Chest:  Clear breath sounds.  Diminished breath sounds bilaterally. No crackles or wheezes.  CVS: S1 &S2 heard.  Systolic murmur. Abdomen: Soft, nontender, nondistended.  Bowel sounds are heard.  Ostomy bag with blood on abdomen at the site of paracentesis. Extremities: No cyanosis, clubbing with bilateral lower extremity edema peripheral pulses are palpable. Psych: Alert, awake and oriented, normal mood CNS:  No cranial nerve deficits.  Power equal in all extremities.   Skin: Warm and dry.  No rashes noted.  Data Reviewed: I have personally reviewed following labs and imaging studies  CBC Lab Results  Component  Value Date   WBC 12.7 (H) 07/20/2021   RBC 2.06 (L) 07/20/2021   HGB 8.4 (L) 07/20/2021   HCT 25.0 (L) 07/20/2021   MCV 101.5 (H) 07/20/2021   MCH 33.0 07/20/2021   PLT 82 (L) 07/20/2021   MCHC 32.5 07/20/2021   RDW 24.5 (H) 07/20/2021   LYMPHSABS 1.0 07/11/2021   MONOABS 1.5 (H) 07/11/2021   EOSABS 0.1 07/11/2021   BASOSABS 0.1 07/11/2021     Last metabolic panel Lab Results  Component Value Date   NA 129 (L) 07/18/2021   K 4.3 07/18/2021   CL 107 07/18/2021   CO2 19 (L) 07/18/2021   BUN 7 07/18/2021   CREATININE 0.77 07/18/2021   GLUCOSE 105 (H) 07/18/2021   GFRNONAA >60 07/18/2021   CALCIUM 7.7 (L) 07/18/2021   PHOS 4.3 07/18/2021   PROT 6.8 07/18/2021   ALBUMIN 1.5 (L) 07/18/2021   BILITOT 17.5 (H) 07/18/2021   ALKPHOS 148 (H) 07/18/2021   AST 70 (H) 07/18/2021   ALT 27 07/18/2021   ANIONGAP 3 (L) 07/18/2021    CBG (last 3)  No results for input(s): GLUCAP in the last 72 hours.   GFR: Estimated Creatinine Clearance: 83.1 mL/min (by C-G formula based on SCr of 0.77 mg/dL).  Coagulation Profile: Recent Labs  Lab 07/15/21 1651 07/16/21 1204 07/18/21 0046 07/19/21 0130 07/20/21 0051  INR 2.4* 2.6* 2.3* 2.6* 2.5*     Recent Results (from the past 240 hour(s))  SARS CORONAVIRUS 2 (TAT 6-24 HRS) Nasopharyngeal Nasopharyngeal Swab     Status: None   Collection Time: 07/12/21  4:45 AM   Specimen: Nasopharyngeal Swab  Result Value Ref Range Status   SARS Coronavirus 2 NEGATIVE NEGATIVE Final    Comment: (NOTE) SARS-CoV-2 target nucleic acids are NOT DETECTED.  The SARS-CoV-2 RNA is generally detectable in upper and lower respiratory specimens during the acute phase of infection. Negative results do not preclude SARS-CoV-2 infection, do not rule out co-infections with other pathogens, and should not be used as the sole basis for treatment or other patient management decisions. Negative results must be combined with clinical observations, patient  history, and epidemiological information. The expected result is Negative.  Fact Sheet for Patients: HairSlick.no  Fact Sheet for Healthcare Providers: quierodirigir.com  This test is not yet approved or cleared by the [place] FDA and  has been authorized for detection and/or diagnosis of SARS-CoV-2 by FDA under an Emergency Use Authorization (EUA). This EUA will remain  in effect (meaning this test can be used) for the duration of the COVID-19 declaration under Se ction 564(b)(1) of the Act, 21 U.S.C. section 360bbb-3(b)(1), unless the authorization is terminated or revoked sooner.  Performed at [facility], [address], [place], [place] [address]   MRSA Next Gen by PCR, Nasal     Status: None   Collection Time: 07/12/21  8:14 PM   Specimen: Nasal Mucosa; Nasal Swab  Result Value Ref Range Status  MRSA by PCR Next Gen NOT DETECTED NOT DETECTED Final    Comment: (NOTE) The GeneXpert MRSA Assay (FDA approved for NASAL specimens only), is one component of a comprehensive MRSA colonization surveillance program. It is not intended to diagnose MRSA infection nor to guide or monitor treatment for MRSA infections. Test performance is not FDA approved in patients less than 21 years old. Performed at [facility], [address] [place], [place] [address]       Radiology Studies: No results found.   Scheduled Meds:  folic acid  1 mg Oral Daily   lactulose  10 g Oral Daily   magnesium oxide  800 mg Oral BID   multivitamin with minerals  1 tablet Oral Daily   phytonadione  10 mg Oral q1800   thiamine  100 mg Oral Daily   Continuous Infusions:     LOS: 9 days    Joycelyn Das, MD [facility] 07/20/2021, 7:52 AM  If 7PM-7AM, please contact night-coverage [email or web address]

## 2021-07-20 NOTE — Progress Notes (Signed)
Date and time results received: 07/20/21 01490     Test: Hbg Critical Value: 6.8  Name of Provider Notified: Dr. Antionette Char  Orders Received? Or Actions Taken?:  Awting orders

## 2021-07-21 LAB — TYPE AND SCREEN
ABO/RH(D): B POS
Antibody Screen: NEGATIVE
Unit division: 0
Unit division: 0

## 2021-07-21 LAB — BPAM PLATELET PHERESIS
Blood Product Expiration Date: 202208302359
ISSUE DATE / TIME: 202208270845
Unit Type and Rh: 7300

## 2021-07-21 LAB — GLUCOSE, CAPILLARY: Glucose-Capillary: 91 mg/dL (ref 70–99)

## 2021-07-21 LAB — BPAM RBC
Blood Product Expiration Date: 202209022359
Blood Product Expiration Date: 202209202359
ISSUE DATE / TIME: 202208241650
ISSUE DATE / TIME: 202208270224
Unit Type and Rh: 7300
Unit Type and Rh: 7300

## 2021-07-21 LAB — CBC
HCT: 20.3 % — ABNORMAL LOW (ref 36.0–46.0)
Hemoglobin: 6.8 g/dL — CL (ref 12.0–15.0)
MCH: 33.2 pg (ref 26.0–34.0)
MCHC: 33.5 g/dL (ref 30.0–36.0)
MCV: 99 fL (ref 80.0–100.0)
Platelets: 102 10*3/uL — ABNORMAL LOW (ref 150–400)
RBC: 2.05 MIL/uL — ABNORMAL LOW (ref 3.87–5.11)
RDW: 23.7 % — ABNORMAL HIGH (ref 11.5–15.5)
WBC: 14 10*3/uL — ABNORMAL HIGH (ref 4.0–10.5)
nRBC: 0 % (ref 0.0–0.2)

## 2021-07-21 LAB — PREPARE PLATELET PHERESIS: Unit division: 0

## 2021-07-21 LAB — PROTIME-INR
INR: 2.9 — ABNORMAL HIGH (ref 0.8–1.2)
Prothrombin Time: 30.7 seconds — ABNORMAL HIGH (ref 11.4–15.2)

## 2021-07-21 LAB — PREPARE RBC (CROSSMATCH)

## 2021-07-21 LAB — HEMOGLOBIN AND HEMATOCRIT, BLOOD
HCT: 26.8 % — ABNORMAL LOW (ref 36.0–46.0)
Hemoglobin: 9.2 g/dL — ABNORMAL LOW (ref 12.0–15.0)

## 2021-07-21 MED ORDER — DIPHENHYDRAMINE HCL 25 MG PO CAPS
25.0000 mg | ORAL_CAPSULE | Freq: Once | ORAL | Status: AC | PRN
  Administered 2021-07-21: 25 mg via ORAL
  Filled 2021-07-21: qty 1

## 2021-07-21 MED ORDER — SODIUM CHLORIDE 0.9% IV SOLUTION: Freq: Once | INTRAVENOUS | Status: AC

## 2021-07-21 NOTE — Progress Notes (Signed)
PROGRESS NOTE    Michelle Little  ZOX:[id number] DOB: [birth year] DOA: 07/11/2021 PCP: Pcp, No   Brief Narrative: Michelle Little is a 35 y.o. 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   During hospitalization, patient has received vitamin K and FFP's but despite that and with pressure dressing she continues to ooze from the local site.  General surgery was consulted due to ongoing oozing who recommended silver nitrate but patient persists to have bleeding.  Assessment & Plan:   Principal Problem:   Acute blood loss anemia Active Problems:   Acute on chronic anemia   Encephalopathy, hepatic (HCC)   Cirrhosis (HCC)   Wound dehiscence  Acute blood loss anemia Secondary to hemorrhage from paracentesis site.  Still continues to ooze blood.  Baseline hemoglobin of around 7.5-8.  Hemoglobin back to 6.8 today.  Has been receiving 1 unit of packed RBC.  We will give FFP again.  Continue vitamin K.  Hyponatremia.  Secondary to liver disease.  We will continue to monitor.  Sodium of 129  AKI Mild.  Resolved.  Latest creatinine of 0.7  Coagulopathy secondary to liver disease.  INR of 2.5 on admission.  Latest INR of 2.9.  Getting vitamin K and has been receiving FFP intermittently as well.  Will receive 1 unit of packed RBC today  Alcoholic liver cirrhosis with decompensated liver disease on furosemide and spironolactone as an outpatient.  Currently on hold.  We will try to introduce by tomorrow.  Currently on lactulose.  No concern for SBP.  GI had seen the patient on this admission.  Elevated ammonia  No encephalopathy.  On decreased dose of lactulose  History of alcohol abuse Has not been drinking recently.  Continue thiamine and  folic acid.  Leukocytosis Chronic. Stable.    Thrombocytopenia Chronic, latest platelet count of 102K  DVT prophylaxis:  SCDs  Code Status:  Full code  Family Communication:  None   Disposition Plan:  Home when bleeding stops.  Transition of care on board for transition,disposition and medication assistance.  Patient is currently at a shelter.  Consultants:  GI.  Procedures:  PRBC/ FFP transfusion  Antimicrobials: None   Subjective: Today, patient was seen and examined at bedside.  Patient continues to have bleeding.  Pressure bandage application has been advised to the nursing staff  Objective: Vitals:   07/21/21 0603 07/21/21 0633 07/21/21 0751 07/21/21 0845  BP: 96/67 98/68 97/66  101/70  Pulse: (!) 102 96 100 93  Resp: 17 19 (!) 22 19  Temp: 98.3 F (36.8 C) 98.2 F (36.8 C) 98.1 F (36.7 C) 98 F (36.7 C)  TempSrc: Oral Oral Oral Oral  SpO2:      Weight:      Height:        Intake/Output Summary (Last 24 hours) at 07/21/2021 1046 Last data filed at 07/21/2021 0845 Gross per 24 hour  Intake 654 ml  Output 150 ml  Net 504 ml    Filed Weights   07/18/21 0500 07/19/21 0637 07/20/21 0627  Weight: 61.9 kg 61.4 kg 63.6 kg    Physical examination: General:  Average built, not in obvious distress HENT: pallor and icterus noted. Oral mucosa is moist.  Chest:  Clear breath sounds.  Diminished breath sounds bilaterally. No crackles or  wheezes.  CVS: S1 &S2 heard.  Systolic murmur. Abdomen: Soft, nontender, nondistended.  Bowel sounds are heard.  Bandage with soakage.   Extremities: No cyanosis, clubbing with bilateral lower extremity edema peripheral pulses are palpable. Psych: Alert, awake and oriented, normal mood CNS:  No cranial nerve deficits.  Power equal in all extremities.   Skin: Warm and dry.  No rashes noted.  Data Reviewed: I have personally reviewed following labs and imaging studies  CBC Lab Results  Component Value Date   WBC 14.0 (H)  07/21/2021   RBC 2.05 (L) 07/21/2021   HGB 6.8 (LL) 07/21/2021   HCT 20.3 (L) 07/21/2021   MCV 99.0 07/21/2021   MCH 33.2 07/21/2021   PLT 102 (L) 07/21/2021   MCHC 33.5 07/21/2021   RDW 23.7 (H) 07/21/2021   LYMPHSABS 1.0 07/11/2021   MONOABS 1.5 (H) 07/11/2021   EOSABS 0.1 07/11/2021   BASOSABS 0.1 07/11/2021     Last metabolic panel Lab Results  Component Value Date   NA 129 (L) 07/18/2021   K 4.3 07/18/2021   CL 107 07/18/2021   CO2 19 (L) 07/18/2021   BUN 7 07/18/2021   CREATININE 0.77 07/18/2021   GLUCOSE 105 (H) 07/18/2021   GFRNONAA >60 07/18/2021   CALCIUM 7.7 (L) 07/18/2021   PHOS 4.3 07/18/2021   PROT 6.8 07/18/2021   ALBUMIN 1.5 (L) 07/18/2021   BILITOT 17.5 (H) 07/18/2021   ALKPHOS 148 (H) 07/18/2021   AST 70 (H) 07/18/2021   ALT 27 07/18/2021   ANIONGAP 3 (L) 07/18/2021    CBG (last 3)  Recent Labs    07/21/21 0955  GLUCAP 91      GFR: Estimated Creatinine Clearance: 83.1 mL/min (by C-G formula based on SCr of 0.77 mg/dL).  Coagulation Profile: Recent Labs  Lab 07/16/21 1204 07/18/21 0046 07/19/21 0130 07/20/21 0051 07/21/21 0036  INR 2.6* 2.3* 2.6* 2.5* 2.9*     Recent Results (from the past 240 hour(s))  SARS CORONAVIRUS 2 (TAT 6-24 HRS) Nasopharyngeal Nasopharyngeal Swab     Status: None   Collection Time: 07/12/21  4:45 AM   Specimen: Nasopharyngeal Swab  Result Value Ref Range Status   SARS Coronavirus 2 NEGATIVE NEGATIVE Final    Comment: (NOTE) SARS-CoV-2 target nucleic acids are NOT DETECTED.  The SARS-CoV-2 RNA is generally detectable in upper and lower respiratory specimens during the acute phase of infection. Negative results do not preclude SARS-CoV-2 infection, do not rule out co-infections with other pathogens, and should not be used as the sole basis for treatment or other patient management decisions. Negative results must be combined with clinical observations, patient history, and epidemiological information.  The expected result is Negative.  Fact Sheet for Patients: HairSlick.no  Fact Sheet for Healthcare Providers: quierodirigir.com  This test is not yet approved or cleared by the [place] FDA and  has been authorized for detection and/or diagnosis of SARS-CoV-2 by FDA under an Emergency Use Authorization (EUA). This EUA will remain  in effect (meaning this test can be used) for the duration of the COVID-19 declaration under Se ction 564(b)(1) of the Act, 21 U.S.C. section 360bbb-3(b)(1), unless the authorization is terminated or revoked sooner.  Performed at [facility], [address], [place], [place] [address]   MRSA Next Gen by PCR, Nasal     Status: None   Collection Time: 07/12/21  8:14 PM   Specimen: Nasal Mucosa; Nasal Swab  Result Value Ref Range Status   MRSA by  PCR Next Gen NOT DETECTED NOT DETECTED Final    Comment: (NOTE) The GeneXpert MRSA Assay (FDA approved for NASAL specimens only), is one component of a comprehensive MRSA colonization surveillance program. It is not intended to diagnose MRSA infection nor to guide or monitor treatment for MRSA infections. Test performance is not FDA approved in patients less than 19 years old. Performed at [facility], [address], [place], [place] [address]       Radiology Studies: No results found.   Scheduled Meds:  folic acid  1 mg Oral Daily   lactulose  10 g Oral Daily   magnesium oxide  800 mg Oral BID   multivitamin with minerals  1 tablet Oral Daily   phytonadione  10 mg Oral q1800   thiamine  100 mg Oral Daily   Continuous Infusions:     LOS: 10 days    Joycelyn Das, MD [facility] 07/21/2021, 10:46 AM  If 7PM-7AM, please contact night-coverage [email or web address]

## 2021-07-21 NOTE — Progress Notes (Signed)
Cross-coverage note:   Hgb is 6.8, down from 8.4 yesterday. She continues to have slow bleeding from paracentesis site. Plan to transfuse 1 unit RBC.

## 2021-07-22 LAB — BPAM RBC
Blood Product Expiration Date: 202209212359
ISSUE DATE / TIME: 202208280612
Unit Type and Rh: 7300

## 2021-07-22 LAB — PROTIME-INR
INR: 2.9 — ABNORMAL HIGH (ref 0.8–1.2)
Prothrombin Time: 30.1 seconds — ABNORMAL HIGH (ref 11.4–15.2)

## 2021-07-22 LAB — TYPE AND SCREEN
ABO/RH(D): B POS
Antibody Screen: NEGATIVE
Unit division: 0

## 2021-07-22 LAB — BPAM FFP
Blood Product Expiration Date: 202209022359
ISSUE DATE / TIME: 202208281226
Unit Type and Rh: 7300

## 2021-07-22 LAB — PREPARE FRESH FROZEN PLASMA

## 2021-07-22 NOTE — Plan of Care (Signed)

## 2021-07-22 NOTE — Progress Notes (Signed)
PROGRESS NOTE    [name]  YOV:[id number] DOB: 20-Feb-1986 DOA: 07/11/2021 PCP: Pcp, No   Brief Narrative: Michelle Little is a 35 y.o. 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   During hospitalization, patient has received vitamin K and FFP's but despite that and with pressure dressing she continues to ooze from the local site.  General surgery was consulted due to ongoing oozing who recommended silver nitrate but patient persisted to have bleeding so ACE bandage with abdominal binder on 07/22/2021..  Assessment & Plan:   Principal Problem:   Acute blood loss anemia Active Problems:   Acute on chronic anemia   Encephalopathy, hepatic (HCC)   Cirrhosis (HCC)   Wound dehiscence  Acute blood loss anemia Secondary to hemorrhage from paracentesis site.    Baseline hemoglobin of around 7.5-8.  Has received multiple units of FFP and PRBC during hospitalization.  Received 1 unit of packed RBC yesterday with hemoglobin of 9.2 today.  Continue vitamin K.  Hyponatremia.  Secondary to liver disease.  We will continue to monitor.    AKI Mild.  Resolved.  Latest creatinine of 0.7  Coagulopathy secondary to liver disease.  INR of 2.5 on admission.  Latest INR of 2.9.  Getting vitamin K and has been receiving FFP intermittently as well.  Receiving packed RBC intermittently as well.  Alcoholic liver cirrhosis with decompensated liver disease on furosemide and spironolactone as an outpatient.  Currently on hold due to hypotension..  We will try to introduce when blood pressure is better.  Currently on lactulose.  No concern for SBP.  GI had seen the patient on this admission.  Elevated ammonia  No encephalopathy.  On decreased dose of  lactulose  History of alcohol abuse Has not been drinking recently.  Continue thiamine and folic acid.  Leukocytosis Chronic. Stable.    Thrombocytopenia Chronic, latest platelet count of 102K  DVT prophylaxis:  SCDs  Code Status:  Full code  Family Communication:  None   Disposition Plan:  Home when bleeding stops likely by tomorrow.  Transition of care on board for transition,disposition and medication assistance.  Patient is currently at a shelter.  Consultants:  GI.  Procedures:  PRBC/ FFP transfusion  Antimicrobials: None   Subjective: Today, patient was seen and examined at bedside.  After applying Ace bandage patient has not had further bleeding.  We will try abdominal binder today. Objective: Vitals:   07/21/21 1600 07/21/21 2017 07/22/21 0330 07/22/21 0856  BP: (!) 91/59 (!) 96/53 (!) 96/58 (!) 89/55  Pulse: 87 90 99 98  Resp: 20 17 19 20   Temp: 98.9 F (37.2 C) 98.5 F (36.9 C) 98.7 F (37.1 C) 98.6 F (37 C)  TempSrc: Oral Oral Oral Oral  SpO2:  95% 98% 98%  Weight:   66.2 kg   Height:        Intake/Output Summary (Last 24 hours) at 07/22/2021 1100 Last data filed at 07/22/2021 0900 Gross per 24 hour  Intake 1205 ml  Output --  Net 1205 ml    Filed Weights   07/19/21 0637 07/20/21 0627 07/22/21 0330  Weight: 61.4 kg 63.6 kg 66.2 kg   Physical examination: General:  Average built, not in obvious distress HENT: Pallor and icterus noted.. Oral mucosa is moist.  Chest:  Clear breath sounds.  Diminished breath sounds bilaterally. No crackles or wheezes.  CVS: S1 &S2 heard. No murmur.  Murmur noted Abdomen: Soft, nontender, Ace bandage in place. Extremities: No cyanosis, clubbing but bilateral lower extremity edema noted peripheral pulses are palpable. Psych: Alert, awake and oriented, normal mood CNS:  No cranial nerve deficits.  Power equal in all extremities.   Skin: Warm and dry.  No rashes noted.  Data Reviewed: I have personally  reviewed following labs and imaging studies  CBC Lab Results  Component Value Date   WBC 14.0 (H) 07/21/2021   RBC 2.05 (L) 07/21/2021   HGB 9.2 (L) 07/21/2021   HCT 26.8 (L) 07/21/2021   MCV 99.0 07/21/2021   MCH 33.2 07/21/2021   PLT 102 (L) 07/21/2021   MCHC 33.5 07/21/2021   RDW 23.7 (H) 07/21/2021   LYMPHSABS 1.0 07/11/2021   MONOABS 1.5 (H) 07/11/2021   EOSABS 0.1 07/11/2021   BASOSABS 0.1 07/11/2021     Last metabolic panel Lab Results  Component Value Date   NA 129 (L) 07/18/2021   K 4.3 07/18/2021   CL 107 07/18/2021   CO2 19 (L) 07/18/2021   BUN 7 07/18/2021   CREATININE 0.77 07/18/2021   GLUCOSE 105 (H) 07/18/2021   GFRNONAA >60 07/18/2021   CALCIUM 7.7 (L) 07/18/2021   PHOS 4.3 07/18/2021   PROT 6.8 07/18/2021   ALBUMIN 1.5 (L) 07/18/2021   BILITOT 17.5 (H) 07/18/2021   ALKPHOS 148 (H) 07/18/2021   AST 70 (H) 07/18/2021   ALT 27 07/18/2021   ANIONGAP 3 (L) 07/18/2021    CBG (last 3)  Recent Labs    07/21/21 0955  GLUCAP 91      GFR: Estimated Creatinine Clearance: 90.1 mL/min (by C-G formula based on SCr of 0.77 mg/dL).  Coagulation Profile: Recent Labs  Lab 07/18/21 0046 07/19/21 0130 07/20/21 0051 07/21/21 0036 07/22/21 0015  INR 2.3* 2.6* 2.5* 2.9* 2.9*     Recent Results (from the past 240 hour(s))  MRSA Next Gen by PCR, Nasal     Status: None   Collection Time: 07/12/21  8:14 PM   Specimen: Nasal Mucosa; Nasal Swab  Result Value Ref Range Status   MRSA by PCR Next Gen NOT DETECTED NOT DETECTED Final    Comment: (NOTE) The GeneXpert MRSA Assay (FDA approved for NASAL specimens only), is one component of a comprehensive MRSA colonization surveillance program. It is not intended to diagnose MRSA infection nor to guide or monitor treatment for MRSA infections. Test performance is not FDA approved in patients less than 93 years old. Performed at [facility], [address], [place], [place] [address]        Radiology Studies: No results found.   Scheduled Meds:  folic acid  1 mg Oral Daily   lactulose  10 g Oral Daily   magnesium oxide  800 mg Oral BID   multivitamin with minerals  1 tablet Oral Daily   phytonadione  10 mg Oral q1800   thiamine  100 mg Oral Daily   Continuous Infusions:     LOS: 11 days    Joycelyn Das, MD [facility] 07/22/2021, 11:00 AM  If 7PM-7AM, please contact night-coverage [email or web address]

## 2021-07-23 ENCOUNTER — Encounter (HOSPITAL_COMMUNITY): Payer: Self-pay | Admitting: Internal Medicine

## 2021-07-23 ENCOUNTER — Inpatient Hospital Stay (HOSPITAL_COMMUNITY): Payer: Medicaid Other

## 2021-07-23 LAB — CBC
HCT: 21.7 % — ABNORMAL LOW (ref 36.0–46.0)
Hemoglobin: 7.3 g/dL — ABNORMAL LOW (ref 12.0–15.0)
MCH: 32.7 pg (ref 26.0–34.0)
MCHC: 33.6 g/dL (ref 30.0–36.0)
MCV: 97.3 fL (ref 80.0–100.0)
Platelets: 96 10*3/uL — ABNORMAL LOW (ref 150–400)
RBC: 2.23 MIL/uL — ABNORMAL LOW (ref 3.87–5.11)
RDW: 24.1 % — ABNORMAL HIGH (ref 11.5–15.5)
WBC: 12 10*3/uL — ABNORMAL HIGH (ref 4.0–10.5)
nRBC: 0.2 % (ref 0.0–0.2)

## 2021-07-23 LAB — BASIC METABOLIC PANEL
Anion gap: 4 — ABNORMAL LOW (ref 5–15)
BUN: 11 mg/dL (ref 6–20)
CO2: 20 mmol/L — ABNORMAL LOW (ref 22–32)
Calcium: 7.5 mg/dL — ABNORMAL LOW (ref 8.9–10.3)
Chloride: 106 mmol/L (ref 98–111)
Creatinine, Ser: 0.89 mg/dL (ref 0.44–1.00)
GFR, Estimated: >60 mL/min (ref >60)
Glucose, Bld: 95 mg/dL (ref 70–99)
Potassium: 3.9 mmol/L (ref 3.5–5.1)
Sodium: 130 mmol/L — ABNORMAL LOW (ref 135–145)

## 2021-07-23 MED ORDER — VITAMIN K 100 MCG PO TABS: 100.0000 ug | ORAL_TABLET | Freq: Every day | ORAL | 0 refills | Status: AC

## 2021-07-23 MED ORDER — SPIRONOLACTONE 100 MG PO TABS: 100.0000 mg | ORAL_TABLET | Freq: Every day | ORAL | 1 refills | Status: DC

## 2021-07-23 MED ORDER — DIPHENHYDRAMINE HCL 25 MG PO CAPS
25.0000 mg | ORAL_CAPSULE | Freq: Once | ORAL | Status: AC | PRN
  Administered 2021-07-23: 25 mg via ORAL
  Filled 2021-07-23: qty 1

## 2021-07-23 MED ORDER — IOHEXOL 350 MG/ML SOLN
80.0000 mL | Freq: Once | INTRAVENOUS | Status: AC | PRN
  Administered 2021-07-23: 80 mL via INTRAVENOUS

## 2021-07-23 MED ORDER — DIPHENHYDRAMINE HCL 25 MG PO CAPS
25.0000 mg | ORAL_CAPSULE | Freq: Once | ORAL | Status: AC | PRN
  Administered 2021-07-24: 25 mg via ORAL
  Filled 2021-07-23: qty 1

## 2021-07-23 MED ORDER — FUROSEMIDE 20 MG PO TABS: 20.0000 mg | ORAL_TABLET | Freq: Two times a day (BID) | ORAL | 1 refills | Status: DC

## 2021-07-23 MED ORDER — LIDOCAINE HCL (PF) 1 % IJ SOLN
INTRAMUSCULAR | Status: AC
  Filled 2021-07-23: qty 5

## 2021-07-23 NOTE — Progress Notes (Signed)
PROGRESS NOTE    Michelle Little  YIR:[id number] DOB: 02/08/1986 DOA: 07/11/2021 PCP: Pcp, No   Brief Narrative: Michelle Little is a 35 y.o. 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   During hospitalization, patient has received vitamin K and FFP's but despite that and with pressure dressing she continues to ooze from the local site.  General surgery was consulted due to ongoing oozing who recommended silver nitrate but patient persisted to have bleeding so ACE bandage with abdominal binder on 07/22/2021.Marland Kitchen  Despite this patient continued to bleed so surgery was again reconsulted on 07/23/2021.  Assessment & Plan:   Principal Problem:   Acute blood loss anemia Active Problems:   Acute on chronic anemia   Encephalopathy, hepatic (HCC)   Cirrhosis (HCC)   Wound dehiscence  Acute blood loss anemia Secondary to hemorrhage from paracentesis site.   Ongoing despite multiple efforts at FFP PRBC vitamin K compression bandage with Ace and wraps.  Patient also had silver nitrate and suture/Dermabond.  I had spoken with the general surgery before and recommendation of silver nitrate but despite this interventions, it has not improved so I spoke with surgery again for further look into the situation.  Surgery recommending CT with IV contrast to see source of bleeding.  Patient might need exploration to stop the site of bleeding but is a very high risk due to coagulopathy   Hyponatremia.  Secondary to liver disease.  We will continue to monitor.    AKI Mild.  Resolved.  Latest creatinine of 0.7  Coagulopathy secondary to liver disease.  INR of 2.5 on admission.  Latest INR of 2.9.  Getting vitamin K and has been receiving FFP intermittently as  well.  Receiving packed RBC intermittently as well.  Alcoholic liver cirrhosis with decompensated liver disease on furosemide and spironolactone as an outpatient.  Currently on hold due to hypotension..  We will try to introduce when blood pressure is better.  Currently on lactulose.  No concern for SBP.  GI had seen the patient on this admission.  Elevated ammonia  No encephalopathy.  On decreased dose of lactulose  History of alcohol abuse Has not been drinking recently.  Continue thiamine and folic acid.  Leukocytosis Chronic. Stable.    Thrombocytopenia Chronic, latest platelet count of 96K  DVT prophylaxis:  SCDs  Code Status:  Full code  Family Communication:  None   Disposition Plan:  Home when bleeding stops.  General surgery following.  Transition of care on board for transition,disposition and medication assistance.  Patient is currently at a shelter.  Consultants:  GI.  Procedures:  PRBC/ FFP transfusion  Antimicrobials: None   Subjective: Today, patient was seen and examined at bedside.  She was thought to be ready for discharge but then continued to have bleeding at the Ace bandage site.  Surgery was then consulted.  Patient continues to feel fatigued and weak.  Objective: Vitals:   07/23/21 0413 07/23/21 0721 07/23/21 0817 07/23/21 1104  BP:   94/64 95/65  Pulse:      Resp:  19 (!) 25 19  Temp:  98.6 F (37 C)  98.4 F (36.9 C)  TempSrc:    Oral  SpO2:      Weight: 66.5 kg     Height:  No intake or output data in the 24 hours ending 07/23/21 1422  Filed Weights   07/20/21 0627 07/22/21 0330 07/23/21 0413  Weight: 63.6 kg 66.2 kg 66.5 kg   Physical examination:  General:  Average built, not in obvious distress HENT: Pallor and icterus noted. Oral mucosa is moist.  Chest:  Clear breath sounds.  Diminished breath sounds bilaterally. No crackles or wheezes.  CVS: S1 &S2 heard. No murmur.  Murmur noted Abdomen: Soft, nontender, Ace  bandage in place with bloody soakage.. Extremities: No cyanosis, clubbing but bilateral lower extremity edema noted.  Peripheral pulses are palpable. Psych: Alert, awake and oriented, normal mood CNS:  No cranial nerve deficits.  Power equal in all extremities.   Skin: Warm and dry.  No rashes noted.  Data Reviewed: I have personally reviewed following labs and imaging studies  CBC Lab Results  Component Value Date   WBC 12.0 (H) 07/23/2021   RBC 2.23 (L) 07/23/2021   HGB 7.3 (L) 07/23/2021   HCT 21.7 (L) 07/23/2021   MCV 97.3 07/23/2021   MCH 32.7 07/23/2021   PLT 96 (L) 07/23/2021   MCHC 33.6 07/23/2021   RDW 24.1 (H) 07/23/2021   LYMPHSABS 1.0 07/11/2021   MONOABS 1.5 (H) 07/11/2021   EOSABS 0.1 07/11/2021   BASOSABS 0.1 07/11/2021     Last metabolic panel Lab Results  Component Value Date   NA 130 (L) 07/23/2021   K 3.9 07/23/2021   CL 106 07/23/2021   CO2 20 (L) 07/23/2021   BUN 11 07/23/2021   CREATININE 0.89 07/23/2021   GLUCOSE 95 07/23/2021   GFRNONAA >60 07/23/2021   CALCIUM 7.5 (L) 07/23/2021   PHOS 4.3 07/18/2021   PROT 6.8 07/18/2021   ALBUMIN 1.5 (L) 07/18/2021   BILITOT 17.5 (H) 07/18/2021   ALKPHOS 148 (H) 07/18/2021   AST 70 (H) 07/18/2021   ALT 27 07/18/2021   ANIONGAP 4 (L) 07/23/2021    CBG (last 3)  Recent Labs    07/21/21 0955  GLUCAP 91      GFR: Estimated Creatinine Clearance: 81.2 mL/min (by C-G formula based on SCr of 0.89 mg/dL).  Coagulation Profile: Recent Labs  Lab 07/18/21 0046 07/19/21 0130 07/20/21 0051 07/21/21 0036 07/22/21 0015  INR 2.3* 2.6* 2.5* 2.9* 2.9*     No results found for this or any previous visit (from the past 240 hour(s)).     Radiology Studies: No results found.   Scheduled Meds:  folic acid  1 mg Oral Daily   lactulose  10 g Oral Daily   lidocaine (PF)       magnesium oxide  800 mg Oral BID   multivitamin with minerals  1 tablet Oral Daily   phytonadione  10 mg Oral q1800    thiamine  100 mg Oral Daily   Continuous Infusions:     LOS: 12 days    Joycelyn Das, MD [facility] 07/23/2021, 2:22 PM  If 7PM-7AM, please contact night-coverage [email or web address]

## 2021-07-23 NOTE — TOC Progression Note (Addendum)
Transition of Care [facility]) - Progression Note    Patient Details  Name: Michelle Little MRN: [id number] Date of Birth: August 20, 1986  [facility] CM/SW Contact  Leone Haven, RN Phone Number: 07/23/2021, 3:05 PM  Clinical Narrative:    Patient  still having bleeding from old paracentesis site. Patient is able to do her own dressing changes per staff RN.  Patient lives in a shelter,  [facility], with her family. Will need transport at dc and ast with medications. NCM left vm with financial counselor to come to see patient regarding Medicaid.  She also asked about resources for food stamps.  Patient states she will have a procedure tomorrow.  TOC will cont to follow for dc needs.  Patient has a follow up scheduled with Gwinda Passe on 08/02/21.  Also the [facility] agency that patient had before with charity stated they tried calling patient , knocking on the door and they could not contact her so they closed the case.          Expected Discharge Plan and Services           Expected Discharge Date: 07/23/21                                     Social Determinants of Health (SDOH) Interventions    Readmission Risk Interventions No flowsheet data found.

## 2021-07-23 NOTE — Consult Note (Signed)
Michelle Little [birth year]  [id number].    Requesting MD: Dr. Joycelyn Das Chief Complaint/Reason for Consult: bleeding from paracentesis site  HPI:  This is a 35 yo black female with a history of alcohol abuse, cirrhosis, and decompensated liver disease who underwent a paracentesis in July of this year.  Earlier this month, she apparently picked the scab from the site and started to bleed.  She had to be admitted for the last 2 weeks secondary to persistent bleeding at this site.  She has required multiple transfusions of blood due to ABL anemia.  She has also received multiple units of FFP and Vit K to help with Little INR but no avail given Little liver failure.  Little INR remains over 2.5.  She has had a stitch in place since Little arrival, but this has now pulled through.  She will go for several hours with no bleeding and then have periods of heavy bleeding since Little admission.  We have been asked to see Little for further evaluation.  ROS: ROS: Please see HPI, otherwise negative.   Family History  Family history unknown: Yes    Past Medical History:  Diagnosis Date   Cirrhosis, alcoholic (HCC)    Supratherapeutic INR    Thyroid disease     History reviewed. No pertinent surgical history.  Social History:  reports that she has never smoked. She has never used smokeless tobacco. She reports that she does not currently use alcohol. No history on file for drug use.  Allergies: No Known Allergies  Medications Prior to Admission  Medication Sig Dispense Refill   folic acid (FOLVITE) 1 MG tablet Take 1 tablet (1 mg total) by mouth daily. 30 tablet 01   lactulose (CEPHULAC) 10 g packet Take 1 packet (10 g total) by mouth 3 (three) times daily. 30 each 1   magnesium oxide (MAG-OX) 400 (240 Mg) MG tablet Take 2 tablets (800 mg total) by mouth 2 (two) times daily. 120 tablet 0   Multiple Vitamin (MULTIVITAMIN WITH MINERALS) TABS tablet Take 1 tablet by mouth daily. 90 tablet 3   thiamine  100 MG tablet Take 1 tablet (100 mg total) by mouth daily. 30 tablet 1   [DISCONTINUED] furosemide (LASIX) 20 MG tablet Take 1 tablet (20 mg total) by mouth 2 (two) times daily. 30 tablet 1   [DISCONTINUED] spironolactone (ALDACTONE) 100 MG tablet Take 1 tablet (100 mg total) by mouth daily. 30 tablet 1     Physical Exam: Blood pressure 95/65, pulse 94, temperature 98.4 F (36.9 C), temperature source Oral, resp. rate 19, height 5\' 4"  (1.626 m), weight 66.5 kg, SpO2 98 %. General: WD, WN black female who is laying in bed in NAD HEENT: head is normocephalic, atraumatic.  Sclera are very icteric. PERRL.  Ears and nose without any masses or lesions.  Mouth is pink and moist Abd: soft, NT, mild distention likely secondary to fluid, +BS, no masses, hernias.  Paracentesis site in LLQ with stitch pulled through.  No current active bleeding at the time of evaluation.  The old stitch was removed.  Little skin was cleaned with chloraprep and then anesthetized with 1% lidocaine.  A 2-0 prolene stitch was then used to place a figure of 8 stitch through this opening.  No further bleeding was encountered for the short time while I was in there. Psych: A&Ox3 with an appropriate affect.   Results for orders placed or performed during the hospital encounter of 07/11/21 (from the  past 48 hour(s))  Protime-INR     Status: Abnormal   Collection Time: 07/22/21 12:15 AM  Result Value Ref Range   Prothrombin Time 30.1 (H) 11.4 - 15.2 seconds   INR 2.9 (H) 0.8 - 1.2    Comment: (NOTE) INR goal varies based on device and disease states. Performed at [facility], [address], [place], [place] [address]   CBC     Status: Abnormal   Collection Time: 07/23/21  6:57 AM  Result Value Ref Range   WBC 12.0 (H) 4.0 - 10.5 K/uL   RBC 2.23 (L) 3.87 - 5.11 MIL/uL   Hemoglobin 7.3 (L) 12.0 - 15.0 g/dL   HCT 35.7 (L) 01.7 - 79.3 %   MCV 97.3 80.0 - 100.0 fL   MCH 32.7 26.0 - 34.0 pg   MCHC 33.6 30.0 - 36.0 g/dL    RDW 90.3 (H) 00.9 - 15.5 %   Platelets 96 (L) 150 - 400 K/uL    Comment: Immature Platelet Fraction may be clinically indicated, consider ordering this additional test [id number] CONSISTENT WITH PREVIOUS RESULT REPEATED TO VERIFY    nRBC 0.2 0.0 - 0.2 %    Comment: Performed at [facility], [address], [place], [place] [address]  Basic metabolic panel     Status: Abnormal   Collection Time: 07/23/21  6:57 AM  Result Value Ref Range   Sodium 130 (L) 135 - 145 mmol/L   Potassium 3.9 3.5 - 5.1 mmol/L   Chloride 106 98 - 111 mmol/L   CO2 20 (L) 22 - 32 mmol/L   Glucose, Bld 95 70 - 99 mg/dL    Comment: Glucose reference range applies only to samples taken after fasting for at least 8 hours.   BUN 11 6 - 20 mg/dL   Creatinine, Ser 5.45 0.44 - 1.00 mg/dL   Calcium 7.5 (L) 8.9 - 10.3 mg/dL   GFR, Estimated >62 >56 mL/min    Comment: (NOTE) Calculated using the CKD-EPI Creatinine Equation (2021)    Anion gap 4 (L) 5 - 15    Comment: Performed at [facility], [address] [place], [place] [address]   No results found.    Assessment/Plan Abdominal wall bleeding from paracentesis site -figure of 8 stitch placed today -CT scan to evaluate for specific vein that may be bleeding -if persistent bleeding despite efforts today, will plan to go to OR tomorrow to cut down on this area to try to find the specific vessel to ligate. -the patient is clearly going to be high risk for further or persistent bleeding secondary to Little supratherapeutic INR and liver decompensation with limited ability to clot.  FEN - regular, NPO p MN VTE - on hold due to bleeding ID - ancef on call to OR  Letha Cape, [facility] 07/23/2021, 1:28 PM Please see Amion for pager number during day hours 7:00am-4:30pm or 7:00am -11:30am on weekends

## 2021-07-23 NOTE — Plan of Care (Signed)
  Problem: Education: Goal: Knowledge of General Education information will improve Description: Including pain rating scale, medication(s)/side effects and non-pharmacologic comfort measures Outcome: Progressing   Problem: Health Behavior/Discharge Planning: Goal: Ability to manage health-related needs will improve Outcome: Progressing   Problem: Nutrition: Goal: Adequate nutrition will be maintained Outcome: Progressing   Problem: Pain Managment: Goal: General experience of comfort will improve Outcome: Progressing   Problem: Skin Integrity: Goal: Risk for impaired skin integrity will decrease Outcome: Progressing   

## 2021-07-24 ENCOUNTER — Inpatient Hospital Stay (HOSPITAL_COMMUNITY): Payer: Medicaid Other | Admitting: Anesthesiology

## 2021-07-24 ENCOUNTER — Encounter (HOSPITAL_COMMUNITY)
Admission: EM | Discharge status: Against Medical Advice | Payer: Self-pay | Source: Home / Self Care | Attending: Internal Medicine

## 2021-07-24 ENCOUNTER — Encounter (HOSPITAL_COMMUNITY): Payer: Self-pay | Admitting: Internal Medicine

## 2021-07-24 HISTORY — PX: INCISION AND DRAINAGE OF WOUND: SHX1803

## 2021-07-24 LAB — COMPREHENSIVE METABOLIC PANEL
ALT: 24 U/L (ref 0–44)
AST: 51 U/L — ABNORMAL HIGH (ref 15–41)
Albumin: 1.4 g/dL — ABNORMAL LOW (ref 3.5–5.0)
Alkaline Phosphatase: 163 U/L — ABNORMAL HIGH (ref 38–126)
Anion gap: 4 — ABNORMAL LOW (ref 5–15)
BUN: 10 mg/dL (ref 6–20)
CO2: 18 mmol/L — ABNORMAL LOW (ref 22–32)
Calcium: 7.4 mg/dL — ABNORMAL LOW (ref 8.9–10.3)
Chloride: 108 mmol/L (ref 98–111)
Creatinine, Ser: 0.89 mg/dL (ref 0.44–1.00)
GFR, Estimated: >60 mL/min (ref >60)
Glucose, Bld: 110 mg/dL — ABNORMAL HIGH (ref 70–99)
Potassium: 3.9 mmol/L (ref 3.5–5.1)
Sodium: 130 mmol/L — ABNORMAL LOW (ref 135–145)
Total Bilirubin: 15.7 mg/dL — ABNORMAL HIGH (ref 0.3–1.2)
Total Protein: 6.4 g/dL — ABNORMAL LOW (ref 6.5–8.1)

## 2021-07-24 LAB — CBC
HCT: 21.7 % — ABNORMAL LOW (ref 36.0–46.0)
Hemoglobin: 7.3 g/dL — ABNORMAL LOW (ref 12.0–15.0)
MCH: 32.9 pg (ref 26.0–34.0)
MCHC: 33.6 g/dL (ref 30.0–36.0)
MCV: 97.7 fL (ref 80.0–100.0)
Platelets: 86 10*3/uL — ABNORMAL LOW (ref 150–400)
RBC: 2.22 MIL/uL — ABNORMAL LOW (ref 3.87–5.11)
RDW: 24.2 % — ABNORMAL HIGH (ref 11.5–15.5)
WBC: 11.1 10*3/uL — ABNORMAL HIGH (ref 4.0–10.5)
nRBC: 0.2 % (ref 0.0–0.2)

## 2021-07-24 LAB — PROTIME-INR
INR: 3.1 — ABNORMAL HIGH (ref 0.8–1.2)
Prothrombin Time: 31.6 seconds — ABNORMAL HIGH (ref 11.4–15.2)

## 2021-07-24 LAB — MAGNESIUM: Magnesium: 1.8 mg/dL (ref 1.7–2.4)

## 2021-07-24 SURGERY — IRRIGATION AND DEBRIDEMENT WOUND
Anesthesia: General | Site: Abdomen

## 2021-07-24 MED ORDER — PROPOFOL 10 MG/ML IV BOLUS
INTRAVENOUS | Status: AC
  Filled 2021-07-24: qty 20

## 2021-07-24 MED ORDER — PHENYLEPHRINE HCL-NACL 20-0.9 MG/250ML-% IV SOLN
INTRAVENOUS | Status: DC | PRN
  Administered 2021-07-24: 50 ug/min via INTRAVENOUS

## 2021-07-24 MED ORDER — BUPIVACAINE-EPINEPHRINE 0.25% -1:200000 IJ SOLN
INTRAMUSCULAR | Status: DC | PRN
  Administered 2021-07-24: 10 mL

## 2021-07-24 MED ORDER — LACTATED RINGERS IV SOLN: INTRAVENOUS | Status: DC

## 2021-07-24 MED ORDER — CHLORHEXIDINE GLUCONATE 0.12 % MT SOLN: 15.0000 mL | Freq: Once | OROMUCOSAL | Status: AC

## 2021-07-24 MED ORDER — ONDANSETRON HCL 4 MG/2ML IJ SOLN
INTRAMUSCULAR | Status: DC | PRN
  Administered 2021-07-24: 4 mg via INTRAVENOUS

## 2021-07-24 MED ORDER — CEFAZOLIN SODIUM-DEXTROSE 2-4 GM/100ML-% IV SOLN
2.0000 g | INTRAVENOUS | Status: AC
  Administered 2021-07-24: 2 g via INTRAVENOUS
  Filled 2021-07-24: qty 100

## 2021-07-24 MED ORDER — ORAL CARE MOUTH RINSE: 15.0000 mL | Freq: Once | OROMUCOSAL | Status: AC

## 2021-07-24 MED ORDER — PHENYLEPHRINE 40 MCG/ML (10ML) SYRINGE FOR IV PUSH (FOR BLOOD PRESSURE SUPPORT)
PREFILLED_SYRINGE | INTRAVENOUS | Status: DC | PRN
  Administered 2021-07-24 (×2): 160 ug via INTRAVENOUS
  Administered 2021-07-24: 80 ug via INTRAVENOUS

## 2021-07-24 MED ORDER — DEXAMETHASONE SODIUM PHOSPHATE 4 MG/ML IJ SOLN
INTRAMUSCULAR | Status: DC | PRN
  Administered 2021-07-24: 10 mg via INTRAVENOUS

## 2021-07-24 MED ORDER — MIDAZOLAM HCL 5 MG/5ML IJ SOLN
INTRAMUSCULAR | Status: DC | PRN
  Administered 2021-07-24: 2 mg via INTRAVENOUS

## 2021-07-24 MED ORDER — CHLORHEXIDINE GLUCONATE 0.12 % MT SOLN
OROMUCOSAL | Status: AC
  Administered 2021-07-24: 15 mL via OROMUCOSAL
  Filled 2021-07-24: qty 15

## 2021-07-24 MED ORDER — MIDAZOLAM HCL 2 MG/2ML IJ SOLN
INTRAMUSCULAR | Status: AC
  Filled 2021-07-24: qty 2

## 2021-07-24 MED ORDER — LIDOCAINE 2% (20 MG/ML) 5 ML SYRINGE
INTRAMUSCULAR | Status: AC
  Filled 2021-07-24: qty 5

## 2021-07-24 MED ORDER — FENTANYL CITRATE (PF) 250 MCG/5ML IJ SOLN
INTRAMUSCULAR | Status: AC
  Filled 2021-07-24: qty 5

## 2021-07-24 MED ORDER — BUPIVACAINE-EPINEPHRINE (PF) 0.25% -1:200000 IJ SOLN
INTRAMUSCULAR | Status: AC
  Filled 2021-07-24: qty 30

## 2021-07-24 MED ORDER — ROCURONIUM BROMIDE 10 MG/ML (PF) SYRINGE
PREFILLED_SYRINGE | INTRAVENOUS | Status: AC
  Filled 2021-07-24: qty 10

## 2021-07-24 MED ORDER — DEXMEDETOMIDINE (PRECEDEX) IN NS 20 MCG/5ML (4 MCG/ML) IV SYRINGE
PREFILLED_SYRINGE | INTRAVENOUS | Status: AC
  Filled 2021-07-24: qty 10

## 2021-07-24 MED ORDER — LIDOCAINE 2% (20 MG/ML) 5 ML SYRINGE
INTRAMUSCULAR | Status: DC | PRN
  Administered 2021-07-24: 60 mg via INTRAVENOUS

## 2021-07-24 MED ORDER — PROPOFOL 10 MG/ML IV BOLUS
INTRAVENOUS | Status: DC | PRN
  Administered 2021-07-24: 200 mg via INTRAVENOUS

## 2021-07-24 MED ORDER — FENTANYL CITRATE (PF) 250 MCG/5ML IJ SOLN
INTRAMUSCULAR | Status: DC | PRN
  Administered 2021-07-24 (×3): 50 ug via INTRAVENOUS

## 2021-07-24 SURGICAL SUPPLY — 28 items
BAG COUNTER SPONGE SURGICOUNT (BAG) ×2 IMPLANT
CANISTER SUCT 3000ML PPV (MISCELLANEOUS) ×2 IMPLANT
CNTNR URN SCR LID CUP LEK RST (MISCELLANEOUS) ×1 IMPLANT
CONT SPEC 4OZ STRL OR WHT (MISCELLANEOUS) ×1
COVER SURGICAL LIGHT HANDLE (MISCELLANEOUS) ×2 IMPLANT
DRAPE LAPAROSCOPIC ABDOMINAL (DRAPES) ×2 IMPLANT
ELECT REM PT RETURN 9FT ADLT (ELECTROSURGICAL) ×2
ELECTRODE REM PT RTRN 9FT ADLT (ELECTROSURGICAL) ×1 IMPLANT
GAUZE SPONGE 4X4 12PLY STRL (GAUZE/BANDAGES/DRESSINGS) ×2 IMPLANT
GLOVE SURG ENC MOIS LTX SZ7 (GLOVE) ×2 IMPLANT
GLOVE SURG UNDER POLY LF SZ7.5 (GLOVE) ×2 IMPLANT
GOWN STRL REUS W/ TWL LRG LVL3 (GOWN DISPOSABLE) ×2 IMPLANT
GOWN STRL REUS W/TWL LRG LVL3 (GOWN DISPOSABLE) ×2
KIT BASIN OR (CUSTOM PROCEDURE TRAY) ×2 IMPLANT
KIT TURNOVER KIT B (KITS) ×2 IMPLANT
NEEDLE PRECISIONGLIDE 27X1.5 (NEEDLE) ×2 IMPLANT
NS IRRIG 1000ML POUR BTL (IV SOLUTION) ×2 IMPLANT
PACK GENERAL/GYN (CUSTOM PROCEDURE TRAY) ×2 IMPLANT
PAD ARMBOARD 7.5X6 YLW CONV (MISCELLANEOUS) ×4 IMPLANT
PENCIL SMOKE EVACUATOR (MISCELLANEOUS) ×2 IMPLANT
SPONGE T-LAP 18X18 ~~LOC~~+RFID (SPONGE) ×4 IMPLANT
SUT SILK 3 0 SH CR/8 (SUTURE) ×2 IMPLANT
SUT VIC AB 3-0 SH 27 (SUTURE) ×1
SUT VIC AB 3-0 SH 27XBRD (SUTURE) ×1 IMPLANT
SYR CONTROL 10ML LL (SYRINGE) ×2 IMPLANT
TOWEL GREEN STERILE (TOWEL DISPOSABLE) ×2 IMPLANT
TOWEL GREEN STERILE FF (TOWEL DISPOSABLE) ×2 IMPLANT
WATER STERILE IRR 1000ML POUR (IV SOLUTION) ×2 IMPLANT

## 2021-07-24 NOTE — Progress Notes (Signed)
Patient consent signed 8/31 8:50.

## 2021-07-24 NOTE — Anesthesia Procedure Notes (Signed)
Procedure Name: LMA Insertion Date/Time: 07/24/2021 12:54 PM Performed by: Rosiland Oz, CRNA Pre-anesthesia Checklist: Patient identified, Emergency Drugs available, Suction available and Patient being monitored Patient Re-evaluated:Patient Re-evaluated prior to induction Oxygen Delivery Method: Circle System Utilized Preoxygenation: Pre-oxygenation with 100% oxygen Induction Type: IV induction Ventilation: Mask ventilation without difficulty LMA: LMA inserted LMA Size: 3.0 Number of attempts: 1 Airway Equipment and Method: Bite block Placement Confirmation: positive ETCO2 Tube secured with: Tape Dental Injury: Teeth and Oropharynx as per pre-operative assessment  Comments: Placed by Lenis Noon SRNA

## 2021-07-24 NOTE — Transfer of Care (Signed)
Immediate Anesthesia Transfer of Care Note  Patient: Michelle Little  Procedure(s) Performed: Abdominal Wound Exploration With Vessel Ligation (Abdomen)  Patient Location: PACU  Anesthesia Type:General  Level of Consciousness: drowsy and patient cooperative  Airway & Oxygen Therapy: Patient Spontanous Breathing  Post-op Assessment: Report given to RN and Post -op Vital signs reviewed and stable  Post vital signs: Reviewed and stable  Last Vitals:  Vitals Value Taken Time  BP 92/41 07/24/21 1336  Temp    Pulse 94 07/24/21 1337  Resp 18 07/24/21 1337  SpO2 100 % 07/24/21 1337  Vitals shown include unvalidated device data.  Last Pain:  Vitals:   07/24/21 1147  TempSrc:   PainSc: 0-No pain      Patients Stated Pain Goal: 0 (07/12/21 2100)  Complications: No notable events documented.

## 2021-07-24 NOTE — Op Note (Signed)
Preop diagnosis: Persistent right lower quadrant abdominal wall bleeding after paracentesis Postop diagnosis: Same Procedure performed: Wound exploration Surgeon:Avrom Robarts K Clementine Soulliere Assistant: Leary Roca, PA-C Anesthesia: General Indications: This is a 35 year old female with Childs C cirrhosis with severe coagulopathy who is status post paracentesis about 4 weeks ago.  Since that time, she has had persistent bleeding from the puncture site in the right lower abdominal wall.  Multiple measures including FFP transfusion, vitamin K, cutaneous suture have been attempted to try to stop the bleeding.  The bleeding has persisted.  We were asked to see the patient yesterday.  We attempted a figure-of-eight suture in the skin and subcutaneous tissue to close this area.  This was unsuccessful.  I obtained a CT scan with IV contrast and there does not seem to be any sign of bleeding internally or deep in the subcutaneous tissue.  Therefore, I recommended wound exploration to identify and ligate any bleeding directly under the skin.  Description of procedure: The patient is brought to the operating room and placed in the supine position on the operating table.  After an adequate level of general anesthesia was obtained, the dressing was removed and her abdomen was prepped with Betadine.  We draped in sterile fashion.  She has persistent bleeding from the suture site in the right lower quadrant.  A timeout was taken to ensure the proper patient and proper procedure.  We remove the old suture.  There is a 5 mm opening.  We made an elliptical incision around this area measuring about 1.5 cm in length.  I grabbed the skin and subcutaneous tissue with an Allis clamp.  The seem to stop all of the bleeding.  We ligated all of the attached subcutaneous adipose tissue with multiple 3-0 silk sutures.  I then amputated the skin and superficial subcutaneous tissue.  We visually inspected the wound for several minutes.  There is no  further sign of bleeding.  I infiltrated with local anesthetic.  We closed the deep dermal layer with 3-0 Vicryl suture.  I again watched the wound for several minutes and there is no sign of bleeding.  We left the skin edges open since this has been a chronically open wound.  A dry dressing is applied.  She was extubated and brought to recovery room in stable condition.  All sponge, instrument, and needle counts are correct.  Wilmon Arms. Corliss Skains, MD, [facility]  General Surgery   07/24/2021 1:28 PM

## 2021-07-24 NOTE — Anesthesia Preprocedure Evaluation (Addendum)
Anesthesia Evaluation  Patient identified by MRN, date of birth, ID band Patient awake    Reviewed: Allergy & Precautions, NPO status , Patient's Chart, lab work & pertinent test results  History of Anesthesia Complications Negative for: history of anesthetic complications  Airway Mallampati: II  TM Distance: >3 FB Neck ROM: Full    Dental no notable dental hx. (+) Dental Advisory Given   Pulmonary neg pulmonary ROS,    Pulmonary exam normal        Cardiovascular negative cardio ROS   Rhythm:Regular Rate:Tachycardia  IMPRESSIONS   1. Left ventricular ejection fraction, by estimation, is 55 to 60%. The left ventricle has normal function. The left ventricle has no regional wall motion abnormalities. Indeterminate diastolic filling due to E-A fusion. 2. Right ventricular systolic function is normal. The right ventricular size is normal. There is mildly elevated pulmonary artery systolic pressure. 3. Left atrial size was severely dilated. 4. Small pericardial effusion adjacent to the posterolateral myocardium. 5. The mitral valve is normal in structure. Mild mitral valve regurgitation. No evidence of mitral stenosis. 6. The aortic valve is tricuspid. Aortic valve regurgitation is not visualized. No aortic stenosis is present. 7. The inferior vena cava is dilated in size with <50% respiratory variability, suggesting right atrial pressure of 15 mmHg.   Neuro/Psych negative neurological ROS     GI/Hepatic negative GI ROS, (+) Cirrhosis   ascites    ,   Endo/Other  negative endocrine ROS  Renal/GU Renal disease     Musculoskeletal negative musculoskeletal ROS (+)   Abdominal   Peds  Hematology negative hematology ROS (+) anemia ,   Anesthesia Other Findings   Reproductive/Obstetrics                            Anesthesia Physical Anesthesia Plan  ASA: 4  Anesthesia Plan: General    Post-op Pain Management:    Induction: Intravenous  PONV Risk Score and Plan: 3 and Ondansetron, Midazolam and Scopolamine patch - Pre-op  Airway Management Planned: LMA  Additional Equipment:   Intra-op Plan:   Post-operative Plan: Extubation in OR  Informed Consent: I have reviewed the patients History and Physical, chart, labs and discussed the procedure including the risks, benefits and alternatives for the proposed anesthesia with the patient or authorized representative who has indicated his/her understanding and acceptance.     Dental advisory given  Plan Discussed with: Anesthesiologist and CRNA  Anesthesia Plan Comments:        Anesthesia Quick Evaluation

## 2021-07-24 NOTE — Progress Notes (Signed)
PROGRESS NOTE  Chetara Kropp  MOQ:[id number] DOB: Dec 14, 1985 DOA: 07/11/2021 PCP: Pcp, No   Brief Narrative: Michelle Little is a 35 y.o. female who was admitted at [facility] 7/25 - 8/7 due to hepatic encephalopathy and volume overload due to decompensated alcoholic cirrhosis, also noted to be profoundly anemic which stabilized after 6u PRBCs. Paracentesis was performed 7/27 felt to be consistent with portal HTN. She was discharged after diuresis and normalized mentation. She noted bleeding from paracentesis site on 8/14 prompting return to ED 8/14, 8/15, 8/16, and 8/18 with downtrending hgb to 6.5g/dl, ultimately was convinced to stay in the hospital and admitted 8/19. INR stable at 2.5. 6 additional units PRBCs total have been transfused this admission, in addition to 7 units of FFP and continued vitamin K without improvement in INR and the site continued bleeding despite stitching, pressure, silver nitrate, dermabond, etc. Surgery was consulted, took to OR for exploration 8/31. Subcutaneous clamping was performed with hemostasis and this area was ligated with silk sutures.    Assessment & Plan: Principal Problem:   Acute blood loss anemia Active Problems:   Acute on chronic anemia   Encephalopathy, hepatic (HCC)   Cirrhosis (HCC)   Wound dehiscence  Ms. Guarino is a 36yo F with decompensated alcoholic cirrhosis with associated ascites, coagulopathy, hyperbilirubinemia, and recent history of hepatic encephalopathy. Her coagulopathy has led to persistent bleeding and anemia despite significant FFP and RBC transfusion support. Wound exploration and ligation of bleeding area has been performed.   Acute blood loss anemia: Due to ongoing hemorrhage in setting of coagulopathy, suspected to be due to bleeding dilated superficial vessel due to portal HTN.  - s/p ligation 8/31. - Continue CBC trending, transfuse prn hgb <7 g/dl.  - Continue vitamin K - Thrombocytopenia noted, though stable, not requiring  platelet transfusion at this time.   Decompensated alcoholic cirrhosis:  - Continue lasix, spironolactone if BP will tolerate. Weight is stable near presumed best dry weight ~66kg. - Continue lactulose. Ammonia elevated, though mentation is stable. - GI consulted this admission, recommends outpatient follow up. Guarded prognosis given her severity at this time. - CT performed revealing nonspecific hepatic densities. MRI recommended. Will d/w patient   AKI: Mild, resolved.   Hyponatremia: Due to cirrhosis. Stable.   DVT prophylaxis: SCDs Code Status: Full Family Communication: None at bedside Disposition Plan:  Status is: Inpatient  Remains inpatient appropriate because:Hemodynamically unstable and Inpatient level of care appropriate due to severity of illness  Dispo: The patient is from: Home              Anticipated d/c is to: Home              Patient currently is not medically stable to d/c.   Difficult to place patient No  Consultants:  GI Surgery  Procedures:  Wound exploration by Dr. Corliss Skains 07/24/2021  Antimicrobials: None   Subjective: Irritated by being NPO. Bleeding has continued throughout today as it has for days. No dyspnea or chest pain or lightheadedness. Leg swelling is stable.   Objective: Vitals:   07/24/21 1337 07/24/21 1352 07/24/21 1407 07/24/21 1425  BP: (!) 92/41 (!) 91/46 (!) 100/56 (!) 96/32  Pulse: 94 95 100 96  Resp: 17 17 19 20   Temp: 97.9 F (36.6 C)  98 F (36.7 C) 98.5 F (36.9 C)  TempSrc:    Oral  SpO2: 100% 99% 100% 100%  Weight:      Height:        Intake/Output  Summary (Last 24 hours) at 07/24/2021 1613 Last data filed at 07/24/2021 1500 Gross per 24 hour  Intake 800 ml  Output 10 ml  Net 790 ml   Filed Weights   07/23/21 0413 07/24/21 0408 07/24/21 1133  Weight: 66.5 kg 67 kg 65.8 kg    Gen: Chronically ill-appearing female in no distress Pulm: Non-labored breathing room air. Clear to auscultation bilaterally.  CV:  Regular rate and rhythm. No murmur, rub, or gallop. No JVD, stable 3+ pitting pedal edema. GI: Abdomen soft, non-tender, distended with normoactive bowel sounds. No organomegaly or masses felt. Ext: Warm, no deformities Skin: RLQ wound with some slow active bleeding. Jaundiced. Neuro: Alert and oriented. No focal neurological deficits. Psych: Judgement and insight appear normal. Mood & affect appropriate.   Data Reviewed: I have personally reviewed following labs and imaging studies  CBC: Recent Labs  Lab 07/19/21 0130 07/20/21 0051 07/20/21 0703 07/21/21 0036 07/21/21 1130 07/23/21 0657 07/24/21 0048  WBC 12.2* 12.7*  --  14.0*  --  12.0* 11.1*  HGB 7.4* 6.8* 8.4* 6.8* 9.2* 7.3* 7.3*  HCT 22.9* 20.9* 25.0* 20.3* 26.8* 21.7* 21.7*  MCV 102.7* 101.5*  --  99.0  --  97.3 97.7  PLT 75* 82*  --  102*  --  96* 86*   Basic Metabolic Panel: Recent Labs  Lab 07/18/21 0046 07/23/21 0657 07/24/21 0048  NA 129* 130* 130*  K 4.3 3.9 3.9  CL 107 106 108  CO2 19* 20* 18*  GLUCOSE 105* 95 110*  BUN 7 11 10   CREATININE 0.77 0.89 0.89  CALCIUM 7.7* 7.5* 7.4*  MG 2.0  --  1.8  PHOS 4.3  --   --    GFR: Estimated Creatinine Clearance: 80.7 mL/min (by C-G formula based on SCr of 0.89 mg/dL). Liver Function Tests: Recent Labs  Lab 07/18/21 0046 07/24/21 0048  AST 70* 51*  ALT 27 24  ALKPHOS 148* 163*  BILITOT 17.5* 15.7*  PROT 6.8 6.4*  ALBUMIN 1.5* 1.4*   No results for input(s): LIPASE, AMYLASE in the last 168 hours. No results for input(s): AMMONIA in the last 168 hours. Coagulation Profile: Recent Labs  Lab 07/19/21 0130 07/20/21 0051 07/21/21 0036 07/22/21 0015 07/24/21 0048  INR 2.6* 2.5* 2.9* 2.9* 3.1*   Cardiac Enzymes: No results for input(s): CKTOTAL, CKMB, CKMBINDEX, TROPONINI in the last 168 hours. BNP (last 3 results) No results for input(s): PROBNP in the last 8760 hours. HbA1C: No results for input(s): HGBA1C in the last 72 hours. CBG: Recent Labs   Lab 07/21/21 0955  GLUCAP 91   Lipid Profile: No results for input(s): CHOL, HDL, LDLCALC, TRIG, CHOLHDL, LDLDIRECT in the last 72 hours. Thyroid Function Tests: No results for input(s): TSH, T4TOTAL, FREET4, T3FREE, THYROIDAB in the last 72 hours. Anemia Panel: No results for input(s): VITAMINB12, FOLATE, FERRITIN, TIBC, IRON, RETICCTPCT in the last 72 hours. Urine analysis:    Component Value Date/Time   COLORURINE AMBER (A) 06/17/2021 1011   APPEARANCEUR HAZY (A) 06/17/2021 1011   LABSPEC 1.020 06/17/2021 1011   PHURINE 5.0 06/17/2021 1011   GLUCOSEU NEGATIVE 06/17/2021 1011   HGBUR NEGATIVE 06/17/2021 1011   BILIRUBINUR LARGE (A) 06/17/2021 1011   KETONESUR 15 (A) 06/17/2021 1011   PROTEINUR 30 (A) 06/17/2021 1011   NITRITE NEGATIVE 06/17/2021 1011   LEUKOCYTESUR MODERATE (A) 06/17/2021 1011   No results found for this or any previous visit (from the past 240 hour(s)).    Radiology Studies: CT ABDOMEN  PELVIS W CONTRAST  Result Date: 07/23/2021 CLINICAL DATA:  Persistent abdominal wall bleeding s/p paracentesis over a month ago. Eval for which vessel may be bleeding EXAM: CT ABDOMEN AND PELVIS WITH CONTRAST TECHNIQUE: Multidetector CT imaging of the abdomen and pelvis was performed using the standard protocol following bolus administration of intravenous contrast. CONTRAST:  79mL OMNIPAQUE IOHEXOL 350 MG/ML SOLN COMPARISON:  CT abdomen pelvis 06/17/2021. FINDINGS: Lower chest: Bilateral trace, right greater than left, pleural effusions. Hepatobiliary: Nodular hepatic contour. Innumerable poorly defined hepatic hypodensities. Contracted gallbladder. No CT evidence of gallstones. No biliary dilatation. Pancreas: No focal lesion. Normal pancreatic contour. No surrounding inflammatory changes. No main pancreatic ductal dilatation. Spleen: Normal in size without focal abnormality. Adrenals/Urinary Tract: No adrenal nodule bilaterally. Bilateral kidneys enhance symmetrically. No  hydronephrosis. No hydroureter. The urinary bladder is unremarkable. Stomach/Bowel: Stomach is within normal limits. No evidence of bowel wall thickening or dilatation. Appendix appears normal. Vascular/Lymphatic: Patent main portal, splenic, superior mesenteric veins. Multiple venous collaterals are identified including a recanalized paraumbilical vein. No abdominal aorta or iliac aneurysm. No abdominal, pelvic, or inguinal lymphadenopathy. Reproductive: Peripherally calcified lesion within the uterus measuring up to 5.5 cm likely representing a degenerative fibroid. Otherwise the uterus and bilateral ovaries/adnexal regions are unremarkable. Other: Small to moderate simple free fluid ascites. Curvilinear hyperdensities along the right pericolic gutter and inferior to the right hepatic lobe of unclear etiology. No intraperitoneal free gas. No organized fluid collection. Musculoskeletal: No abdominal wall hernia or abnormality. No suspicious lytic or blastic osseous lesions. No acute displaced fracture. IMPRESSION: 1. Markedly limited evaluation for bleed due to single portal venous phase study obtained. However, no anterior abdominal wall hematoma identified. 2. Cirrhosis with portal hypertension. Diffusely heterogeneous hepatic parenchyma with suggestion of multiple poorly defined hypodense lesions concerning for underlying malignancy. Recommend MRI liver protocol for further evaluation. 3. Small to moderate volume simple free fluid ascites. 4. Bilateral trace pleural effusions. 5. Uterine fibroid. Electronically Signed   By: Tish Frederickson M.D.   On: 07/23/2021 22:55    Scheduled Meds:  folic acid  1 mg Oral Daily   lactulose  10 g Oral Daily   magnesium oxide  800 mg Oral BID   multivitamin with minerals  1 tablet Oral Daily   phytonadione  10 mg Oral q1800   thiamine  100 mg Oral Daily   Continuous Infusions:   LOS: 13 days   Time spent: 35 minutes.  Tyrone Nine, MD [facility] [email or web address] 07/24/2021, 4:13 PM

## 2021-07-24 NOTE — Plan of Care (Signed)

## 2021-07-24 NOTE — Progress Notes (Signed)
Pt asked for sleep aid. Paged Dr. And was able to get Benadryl one time dose for sleep. Pt seen sleeping after receiving medicine. [name] 07/24/21 12:51 AM

## 2021-07-24 NOTE — Anesthesia Postprocedure Evaluation (Signed)
Anesthesia Post Note  Patient: Michelle Little  Procedure(s) Performed: Abdominal Wound Exploration With Vessel Ligation (Abdomen)     Patient location during evaluation: PACU Anesthesia Type: General Level of consciousness: sedated Pain management: pain level controlled Vital Signs Assessment: post-procedure vital signs reviewed and stable Respiratory status: spontaneous breathing and respiratory function stable Cardiovascular status: stable Postop Assessment: no apparent nausea or vomiting Anesthetic complications: no   No notable events documented.  Last Vitals:  Vitals:   07/24/21 1407 07/24/21 1425  BP: (!) 100/56 (!) 96/32  Pulse: 100 96  Resp: 19 20  Temp: 36.7 C 36.9 C  SpO2: 100% 100%    Last Pain:  Vitals:   07/24/21 1425  TempSrc: Oral  PainSc: 0-No pain                 Terrian Sentell DANIEL

## 2021-07-24 NOTE — Progress Notes (Signed)
Progress Note     Subjective: CC: hungry Site continued to bleed following stitch yesterday. Worsened when she got up and walked. Only having pain from tape adhesive  Objective: Vital signs in last 24 hours: Temp:  [97.6 F (36.4 C)-98.4 F (36.9 C)] 98.2 F (36.8 C) (08/31 0408) Resp:  [17-25] 20 (08/31 0500) BP: (94-107)/(59-85) 94/59 (08/31 0500) SpO2:  [95 %-97 %] 96 % (08/31 0408) Weight:  [67 kg] 67 kg (08/31 0408) Last BM Date: 07/22/21  Intake/Output from previous day: No intake/output data recorded. Intake/Output this shift: No intake/output data recorded.  PE: General: pleasant, WD, female who is sitting in bed in NAD HEENT: head is normocephalic, atraumatic.  Scleral icterus.  Mouth is pink and moist Heart: regular, rate, and rhythm.  Normal s1,s2. No obvious murmurs, gallops, or rubs noted.  Palpable radial and pedal pulses bilaterally Lungs: CTAB, no wheezes, rhonchi, or rales noted.  Respiratory effort nonlabored Abd: soft, NT, +BS. RLQ paracentesis site with bulky bandage removed - 4x4 saturated with blood and site with very slow bleeding presently MSK: bilateral lower extremity edema Skin: warm and dry Psych: A&Ox3 with an appropriate affect.    Lab Results:  Recent Labs    07/23/21 0657 07/24/21 0048  WBC 12.0* 11.1*  HGB 7.3* 7.3*  HCT 21.7* 21.7*  PLT 96* 86*   BMET Recent Labs    07/23/21 0657 07/24/21 0048  NA 130* 130*  K 3.9 3.9  CL 106 108  CO2 20* 18*  GLUCOSE 95 110*  BUN 11 10  CREATININE 0.89 0.89  CALCIUM 7.5* 7.4*   PT/INR Recent Labs    07/22/21 0015 07/24/21 0048  LABPROT 30.1* 31.6*  INR 2.9* 3.1*   CMP     Component Value Date/Time   NA 130 (L) 07/24/2021 0048   K 3.9 07/24/2021 0048   CL 108 07/24/2021 0048   CO2 18 (L) 07/24/2021 0048   GLUCOSE 110 (H) 07/24/2021 0048   BUN 10 07/24/2021 0048   CREATININE 0.89 07/24/2021 0048   CALCIUM 7.4 (L) 07/24/2021 0048   PROT 6.4 (L) 07/24/2021 0048   ALBUMIN  1.4 (L) 07/24/2021 0048   AST 51 (H) 07/24/2021 0048   ALT 24 07/24/2021 0048   ALKPHOS 163 (H) 07/24/2021 0048   BILITOT 15.7 (H) 07/24/2021 0048   GFRNONAA >60 07/24/2021 0048   Lipase  No results found for: LIPASE     Studies/Results: CT ABDOMEN PELVIS W CONTRAST  Result Date: 07/23/2021 CLINICAL DATA:  Persistent abdominal wall bleeding s/p paracentesis over a month ago. Eval for which vessel may be bleeding EXAM: CT ABDOMEN AND PELVIS WITH CONTRAST TECHNIQUE: Multidetector CT imaging of the abdomen and pelvis was performed using the standard protocol following bolus administration of intravenous contrast. CONTRAST:  84mL OMNIPAQUE IOHEXOL 350 MG/ML SOLN COMPARISON:  CT abdomen pelvis 06/17/2021. FINDINGS: Lower chest: Bilateral trace, right greater than left, pleural effusions. Hepatobiliary: Nodular hepatic contour. Innumerable poorly defined hepatic hypodensities. Contracted gallbladder. No CT evidence of gallstones. No biliary dilatation. Pancreas: No focal lesion. Normal pancreatic contour. No surrounding inflammatory changes. No main pancreatic ductal dilatation. Spleen: Normal in size without focal abnormality. Adrenals/Urinary Tract: No adrenal nodule bilaterally. Bilateral kidneys enhance symmetrically. No hydronephrosis. No hydroureter. The urinary bladder is unremarkable. Stomach/Bowel: Stomach is within normal limits. No evidence of bowel wall thickening or dilatation. Appendix appears normal. Vascular/Lymphatic: Patent main portal, splenic, superior mesenteric veins. Multiple venous collaterals are identified including a recanalized paraumbilical vein. No abdominal aorta  or iliac aneurysm. No abdominal, pelvic, or inguinal lymphadenopathy. Reproductive: Peripherally calcified lesion within the uterus measuring up to 5.5 cm likely representing a degenerative fibroid. Otherwise the uterus and bilateral ovaries/adnexal regions are unremarkable. Other: Small to moderate simple free fluid  ascites. Curvilinear hyperdensities along the right pericolic gutter and inferior to the right hepatic lobe of unclear etiology. No intraperitoneal free gas. No organized fluid collection. Musculoskeletal: No abdominal wall hernia or abnormality. No suspicious lytic or blastic osseous lesions. No acute displaced fracture. IMPRESSION: 1. Markedly limited evaluation for bleed due to single portal venous phase study obtained. However, no anterior abdominal wall hematoma identified. 2. Cirrhosis with portal hypertension. Diffusely heterogeneous hepatic parenchyma with suggestion of multiple poorly defined hypodense lesions concerning for underlying malignancy. Recommend MRI liver protocol for further evaluation. 3. Small to moderate volume simple free fluid ascites. 4. Bilateral trace pleural effusions. 5. Uterine fibroid. Electronically Signed   By: Tish Frederickson M.D.   On: 07/23/2021 22:55    Anti-infectives: Anti-infectives (From admission, onward)    None        Assessment/Plan  Abdominal wall bleeding from paracentesis site -figure of 8 stitch placed yesterday - continued ooze from site -CT 8/30 - without vessel identified but no anterior abdominal wall hematoma, reassuring for superficial bleeding - plan for OR today to attempt wound exploration and vessel ligation -the patient is clearly going to be high risk for further or persistent bleeding secondary to her supratherapeutic INR and liver decompensation with limited ability to clot. I discussed these risks with patient today including risks related to general anesthesia and she is agreeable to proceed   FEN - NPO VTE - on hold due to bleeding ID - ancef on call to OR    LOS: 13 days    Eric Form, [facility] 07/24/2021, 7:41 AM Please see Amion for pager number during day hours 7:00am-4:30pm

## 2021-07-25 ENCOUNTER — Encounter (HOSPITAL_COMMUNITY): Payer: Self-pay | Admitting: Surgery

## 2021-07-25 LAB — CBC
HCT: 21.1 % — ABNORMAL LOW (ref 36.0–46.0)
Hemoglobin: 7.2 g/dL — ABNORMAL LOW (ref 12.0–15.0)
MCH: 34 pg (ref 26.0–34.0)
MCHC: 34.1 g/dL (ref 30.0–36.0)
MCV: 99.5 fL (ref 80.0–100.0)
Platelets: 87 K/uL — ABNORMAL LOW (ref 150–400)
RBC: 2.12 MIL/uL — ABNORMAL LOW (ref 3.87–5.11)
RDW: 24.6 % — ABNORMAL HIGH (ref 11.5–15.5)
WBC: 10.8 K/uL — ABNORMAL HIGH (ref 4.0–10.5)
nRBC: 0.2 % (ref 0.0–0.2)

## 2021-07-25 LAB — PROTIME-INR
INR: 3 — ABNORMAL HIGH (ref 0.8–1.2)
Prothrombin Time: 31.4 seconds — ABNORMAL HIGH (ref 11.4–15.2)

## 2021-07-25 MED ORDER — FUROSEMIDE 20 MG PO TABS
20.0000 mg | ORAL_TABLET | Freq: Two times a day (BID) | ORAL | Status: DC
  Administered 2021-07-25 – 2021-07-26 (×3): 20 mg via ORAL
  Filled 2021-07-25 (×4): qty 1

## 2021-07-25 MED ORDER — SPIRONOLACTONE 25 MG PO TABS
100.0000 mg | ORAL_TABLET | Freq: Every day | ORAL | Status: DC
  Administered 2021-07-25 – 2021-07-26 (×2): 100 mg via ORAL
  Filled 2021-07-25 (×3): qty 4

## 2021-07-25 MED ORDER — BACITRACIN ZINC 500 UNIT/GM EX OINT
TOPICAL_OINTMENT | Freq: Two times a day (BID) | CUTANEOUS | Status: DC
  Filled 2021-07-25 (×2): qty 28.4

## 2021-07-25 NOTE — Progress Notes (Signed)
Notified by nursing of worsening bleeding from RLQ. At time of my exam dressing is clean and dry with scant blood and site not actively bleeding. This is consistent with reported intermittent bleeding. Placed small amount of surgicel hemostatic snow in wound and replaced bandage. Leave snow in place and continue dressing changes bid.

## 2021-07-25 NOTE — Progress Notes (Signed)
PROGRESS NOTE  Michelle Little  WYO:[id number] DOB: 1986/08/18 DOA: 07/11/2021 PCP: Pcp, No   Brief Narrative: Michelle Little is a 35 y.o. female who was admitted at [facility] 7/25 - 8/7 due to hepatic encephalopathy and volume overload due to decompensated alcoholic cirrhosis, also noted to be profoundly anemic which stabilized after 6u PRBCs. Paracentesis was performed 7/27 felt to be consistent with portal HTN. She was discharged after diuresis and normalized mentation. She noted bleeding from paracentesis site on 8/14 prompting return to ED 8/14, 8/15, 8/16, and 8/18 with downtrending hgb to 6.5g/dl, ultimately was convinced to stay in the hospital and admitted 8/19. INR stable at 2.5. 6 additional units PRBCs total have been transfused this admission, in addition to 7 units of FFP and continued vitamin K without improvement in INR and the site continued bleeding despite stitching, pressure, silver nitrate, dermabond, etc. Surgery was consulted, took to OR for exploration 8/31. Subcutaneous clamping was performed with hemostasis and this area was ligated with silk sutures. Hgb remains low at 7.2g/dl and slow oozing from the wound persists.  Assessment & Plan: Principal Problem:   Acute blood loss anemia Active Problems:   Acute on chronic anemia   Encephalopathy, hepatic (HCC)   Cirrhosis (HCC)   Wound dehiscence  Michelle Little is a 35yo F with decompensated alcoholic cirrhosis with associated ascites, coagulopathy, hyperbilirubinemia, and recent history of hepatic encephalopathy. Her coagulopathy has led to persistent bleeding and anemia despite significant FFP and RBC transfusion support. Wound exploration and ligation of bleeding area has been performed.   Acute blood loss anemia: Due to ongoing hemorrhage in setting of coagulopathy, suspected to be due to bleeding dilated superficial vessel due to portal HTN.  - s/p ligation 8/31. - Continue CBC trending, transfuse prn hgb <7 g/dl. Will need oozing  to be controlled and hgb stable without transfusion support prior to discharge. Pt clearly frustrated by this. - Continue vitamin K. INR remains elevated at 3. - Thrombocytopenia noted, though stable, not requiring platelet transfusion at this time.   Decompensated alcoholic cirrhosis:  - Will restart lasix, spironolactone if BP will tolerate. Weight is stable near presumed best dry weight ~66kg. - Continue lactulose. Ammonia elevated, though mentation is stable. - GI consulted this admission, recommends outpatient follow up. Guarded prognosis given her severity at this time. - CT performed revealing nonspecific hepatic densities. MRI recommended. D/w patient who is ok having this done to r/o neoplasm here as she will be here for an indeterminate amount of time.   AKI: Mild, resolved. Ok for contrast.  Hyponatremia: Due to cirrhosis. Stable.   DVT prophylaxis: SCDs Code Status: Full Family Communication: None at bedside Disposition Plan:  Status is: Inpatient  Remains inpatient appropriate because:Hemodynamically unstable and Inpatient level of care appropriate due to severity of illness  Dispo: The patient is from: Home              Anticipated d/c is to: Home              Patient currently is not medically stable to d/c.   Difficult to place patient No  Consultants:  GI Surgery  Procedures:  Wound exploration, ligation by Dr. Corliss Skains 07/24/2021  Antimicrobials: None   Subjective: Very aggravated at staying in the hospital. Feels like she's trying to get better but she's only getting worse. Wound still oozing this morning. Hates the food available here and just wants to go home. Consents to MRI.  Objective: Vitals:   07/24/21 2138 07/24/21  2341 07/25/21 0349 07/25/21 0824  BP: 117/70 112/79 118/77 112/75  Pulse:      Resp: 20 18 15 17   Temp:  97.8 F (36.6 C) 97.6 F (36.4 C) 98 F (36.7 C)  TempSrc:  Oral Oral Oral  SpO2:  97% 95%   Weight:      Height:         Intake/Output Summary (Last 24 hours) at 07/25/2021 1059 Last data filed at 07/25/2021 0300 Gross per 24 hour  Intake 1400 ml  Output 10 ml  Net 1390 ml   Filed Weights   07/23/21 0413 07/24/21 0408 07/24/21 1133  Weight: 66.5 kg 67 kg 65.8 kg   Gen: 35 y.o. female in no distress Pulm: Nonlabored breathing room air. Clear. CV: Regular rate and rhythm. No murmur, rub, or gallop. No JVD, stable significant dependent edema. GI: Abdomen soft, not tender but mildly distended (stable), with normoactive bowel sounds.  Ext: Warm, no deformities Skin: RLQ elliptical open wound has coagulated blood and bright red blood oozing without clear specific source.  Neuro: Alert and oriented. No asterixis or focal neurological deficits. Psych: Judgement and insight appear fair. Mood depressed. Frustrated. No AVH or response to internal stimuli.    Data Reviewed: I have personally reviewed following labs and imaging studies  CBC: Recent Labs  Lab 07/20/21 0051 07/20/21 0703 07/21/21 0036 07/21/21 1130 07/23/21 0657 07/24/21 0048 07/25/21 0033  WBC 12.7*  --  14.0*  --  12.0* 11.1* 10.8*  HGB 6.8*   < > 6.8* 9.2* 7.3* 7.3* 7.2*  HCT 20.9*   < > 20.3* 26.8* 21.7* 21.7* 21.1*  MCV 101.5*  --  99.0  --  97.3 97.7 99.5  PLT 82*  --  102*  --  96* 86* 87*   < > = values in this interval not displayed.   Basic Metabolic Panel: Recent Labs  Lab 07/23/21 0657 07/24/21 0048  NA 130* 130*  K 3.9 3.9  CL 106 108  CO2 20* 18*  GLUCOSE 95 110*  BUN 11 10  CREATININE 0.89 0.89  CALCIUM 7.5* 7.4*  MG  --  1.8   GFR: Estimated Creatinine Clearance: 80.7 mL/min (by C-G formula based on SCr of 0.89 mg/dL). Liver Function Tests: Recent Labs  Lab 07/24/21 0048  AST 51*  ALT 24  ALKPHOS 163*  BILITOT 15.7*  PROT 6.4*  ALBUMIN 1.4*   No results for input(s): LIPASE, AMYLASE in the last 168 hours. No results for input(s): AMMONIA in the last 168 hours. Coagulation Profile: Recent Labs   Lab 07/20/21 0051 07/21/21 0036 07/22/21 0015 07/24/21 0048 07/25/21 0033  INR 2.5* 2.9* 2.9* 3.1* 3.0*   Cardiac Enzymes: No results for input(s): CKTOTAL, CKMB, CKMBINDEX, TROPONINI in the last 168 hours. BNP (last 3 results) No results for input(s): PROBNP in the last 8760 hours. HbA1C: No results for input(s): HGBA1C in the last 72 hours. CBG: Recent Labs  Lab 07/21/21 0955  GLUCAP 91   Lipid Profile: No results for input(s): CHOL, HDL, LDLCALC, TRIG, CHOLHDL, LDLDIRECT in the last 72 hours. Thyroid Function Tests: No results for input(s): TSH, T4TOTAL, FREET4, T3FREE, THYROIDAB in the last 72 hours. Anemia Panel: No results for input(s): VITAMINB12, FOLATE, FERRITIN, TIBC, IRON, RETICCTPCT in the last 72 hours. Urine analysis:    Component Value Date/Time   COLORURINE AMBER (A) 06/17/2021 1011   APPEARANCEUR HAZY (A) 06/17/2021 1011   LABSPEC 1.020 06/17/2021 1011   PHURINE 5.0 06/17/2021 1011  GLUCOSEU NEGATIVE 06/17/2021 1011   HGBUR NEGATIVE 06/17/2021 1011   BILIRUBINUR LARGE (A) 06/17/2021 1011   KETONESUR 15 (A) 06/17/2021 1011   PROTEINUR 30 (A) 06/17/2021 1011   NITRITE NEGATIVE 06/17/2021 1011   LEUKOCYTESUR MODERATE (A) 06/17/2021 1011   No results found for this or any previous visit (from the past 240 hour(s)).    Radiology Studies: CT ABDOMEN PELVIS W CONTRAST  Result Date: 07/23/2021 CLINICAL DATA:  Persistent abdominal wall bleeding s/p paracentesis over a month ago. Eval for which vessel may be bleeding EXAM: CT ABDOMEN AND PELVIS WITH CONTRAST TECHNIQUE: Multidetector CT imaging of the abdomen and pelvis was performed using the standard protocol following bolus administration of intravenous contrast. CONTRAST:  29mL OMNIPAQUE IOHEXOL 350 MG/ML SOLN COMPARISON:  CT abdomen pelvis 06/17/2021. FINDINGS: Lower chest: Bilateral trace, right greater than left, pleural effusions. Hepatobiliary: Nodular hepatic contour. Innumerable poorly defined hepatic  hypodensities. Contracted gallbladder. No CT evidence of gallstones. No biliary dilatation. Pancreas: No focal lesion. Normal pancreatic contour. No surrounding inflammatory changes. No main pancreatic ductal dilatation. Spleen: Normal in size without focal abnormality. Adrenals/Urinary Tract: No adrenal nodule bilaterally. Bilateral kidneys enhance symmetrically. No hydronephrosis. No hydroureter. The urinary bladder is unremarkable. Stomach/Bowel: Stomach is within normal limits. No evidence of bowel wall thickening or dilatation. Appendix appears normal. Vascular/Lymphatic: Patent main portal, splenic, superior mesenteric veins. Multiple venous collaterals are identified including a recanalized paraumbilical vein. No abdominal aorta or iliac aneurysm. No abdominal, pelvic, or inguinal lymphadenopathy. Reproductive: Peripherally calcified lesion within the uterus measuring up to 5.5 cm likely representing a degenerative fibroid. Otherwise the uterus and bilateral ovaries/adnexal regions are unremarkable. Other: Small to moderate simple free fluid ascites. Curvilinear hyperdensities along the right pericolic gutter and inferior to the right hepatic lobe of unclear etiology. No intraperitoneal free gas. No organized fluid collection. Musculoskeletal: No abdominal wall hernia or abnormality. No suspicious lytic or blastic osseous lesions. No acute displaced fracture. IMPRESSION: 1. Markedly limited evaluation for bleed due to single portal venous phase study obtained. However, no anterior abdominal wall hematoma identified. 2. Cirrhosis with portal hypertension. Diffusely heterogeneous hepatic parenchyma with suggestion of multiple poorly defined hypodense lesions concerning for underlying malignancy. Recommend MRI liver protocol for further evaluation. 3. Small to moderate volume simple free fluid ascites. 4. Bilateral trace pleural effusions. 5. Uterine fibroid. Electronically Signed   By: Tish Frederickson M.D.    On: 07/23/2021 22:55    Scheduled Meds:  bacitracin   Topical BID   folic acid  1 mg Oral Daily   lactulose  10 g Oral Daily   magnesium oxide  800 mg Oral BID   multivitamin with minerals  1 tablet Oral Daily   phytonadione  10 mg Oral q1800   thiamine  100 mg Oral Daily   Continuous Infusions:   LOS: 14 days   Time spent: 35 minutes.  Tyrone Nine, MD [facility] [email or web address] 07/25/2021, 10:59 AM

## 2021-07-25 NOTE — TOC Progression Note (Signed)
Transition of Care [facility]) - Progression Note    Patient Details  Name: Michelle Little MRN: [id number] Date of Birth: 06-14-1986  [facility] of [facility]) CM/SW Contact  Leone Haven, RN Phone Number: 07/25/2021, 12:49 PM  Clinical Narrative:    NCM left message with financial counseling to come to speak with patient regarding Medicaid insurance.  Message was left on 8/30 and 9/1.        Expected Discharge Plan and Services           Expected Discharge Date: 07/23/21                                     Social Determinants of Health (SDOH) Interventions    Readmission Risk Interventions No flowsheet data found.

## 2021-07-25 NOTE — Progress Notes (Signed)
Pt rang out to Insurance underwriter. Rn entered room. Pt is holding towel over side. 4x4 gauze that was changed at 1200 are saturated with moderate amount of bright red blood. Towel over gauze has significant amount of blood as well. RN changes dressing and reinforces with an abdominal binder pad and several layers of tape. Carl Best PA-C paged.   Actions: PA-C requested silver nitrate applicators be sent up from the main pharmacy. PA-C is enroute.

## 2021-07-25 NOTE — Progress Notes (Signed)
Progress Note  1 Day Post-Op  Subjective: She is frustrated about her continued bleeding Per nursing report bandage from OR remained without signs of bleeding at 0100 check but with significant blood at time of shift change  Objective: Vital signs in last 24 hours: Temp:  [97.6 F (36.4 C)-98.7 F (37.1 C)] 97.6 F (36.4 C) (09/01 0349) Pulse Rate:  [93-107] 93 (08/31 1641) Resp:  [15-20] 15 (09/01 0349) BP: (85-118)/(32-79) 118/77 (09/01 0349) SpO2:  [95 %-100 %] 95 % (09/01 0349) Weight:  [65.8 kg] 65.8 kg (08/31 1133) Last BM Date: 07/24/21  Intake/Output from previous day: 08/31 0701 - 09/01 0700 In: 1400 [P.O.:600; I.V.:700; IV Piggyback:100] Out: 10 [Blood:10] Intake/Output this shift: No intake/output data recorded.  PE: General: pleasant, WD, female who is sitting in bed in NAD HEENT: head is normocephalic, atraumatic.  Scleral icterus.  Mouth is pink and moist Lungs: Respiratory effort nonlabored Abd: soft, NT to palpation. RLQ wound with bandage removed - 4x4 saturated with blood and site with very slow bleeding presently. Wound margins pink and healthy. No erythema or discharge MSK: bilateral lower extremity edema Skin: warm and dry Psych: A&Ox3 with an appropriate affect.    Lab Results:  Recent Labs    07/24/21 0048 07/25/21 0033  WBC 11.1* 10.8*  HGB 7.3* 7.2*  HCT 21.7* 21.1*  PLT 86* 87*    BMET Recent Labs    07/23/21 0657 07/24/21 0048  NA 130* 130*  K 3.9 3.9  CL 106 108  CO2 20* 18*  GLUCOSE 95 110*  BUN 11 10  CREATININE 0.89 0.89  CALCIUM 7.5* 7.4*    PT/INR Recent Labs    07/24/21 0048 07/25/21 0033  LABPROT 31.6* 31.4*  INR 3.1* 3.0*    CMP     Component Value Date/Time   NA 130 (L) 07/24/2021 0048   K 3.9 07/24/2021 0048   CL 108 07/24/2021 0048   CO2 18 (L) 07/24/2021 0048   GLUCOSE 110 (H) 07/24/2021 0048   BUN 10 07/24/2021 0048   CREATININE 0.89 07/24/2021 0048   CALCIUM 7.4 (L) 07/24/2021 0048   PROT  6.4 (L) 07/24/2021 0048   ALBUMIN 1.4 (L) 07/24/2021 0048   AST 51 (H) 07/24/2021 0048   ALT 24 07/24/2021 0048   ALKPHOS 163 (H) 07/24/2021 0048   BILITOT 15.7 (H) 07/24/2021 0048   GFRNONAA >60 07/24/2021 0048   Lipase  No results found for: LIPASE     Studies/Results: CT ABDOMEN PELVIS W CONTRAST  Result Date: 07/23/2021 CLINICAL DATA:  Persistent abdominal wall bleeding s/p paracentesis over a month ago. Eval for which vessel may be bleeding EXAM: CT ABDOMEN AND PELVIS WITH CONTRAST TECHNIQUE: Multidetector CT imaging of the abdomen and pelvis was performed using the standard protocol following bolus administration of intravenous contrast. CONTRAST:  17mL OMNIPAQUE IOHEXOL 350 MG/ML SOLN COMPARISON:  CT abdomen pelvis 06/17/2021. FINDINGS: Lower chest: Bilateral trace, right greater than left, pleural effusions. Hepatobiliary: Nodular hepatic contour. Innumerable poorly defined hepatic hypodensities. Contracted gallbladder. No CT evidence of gallstones. No biliary dilatation. Pancreas: No focal lesion. Normal pancreatic contour. No surrounding inflammatory changes. No main pancreatic ductal dilatation. Spleen: Normal in size without focal abnormality. Adrenals/Urinary Tract: No adrenal nodule bilaterally. Bilateral kidneys enhance symmetrically. No hydronephrosis. No hydroureter. The urinary bladder is unremarkable. Stomach/Bowel: Stomach is within normal limits. No evidence of bowel wall thickening or dilatation. Appendix appears normal. Vascular/Lymphatic: Patent main portal, splenic, superior mesenteric veins. Multiple venous collaterals are identified including  a recanalized paraumbilical vein. No abdominal aorta or iliac aneurysm. No abdominal, pelvic, or inguinal lymphadenopathy. Reproductive: Peripherally calcified lesion within the uterus measuring up to 5.5 cm likely representing a degenerative fibroid. Otherwise the uterus and bilateral ovaries/adnexal regions are unremarkable. Other:  Small to moderate simple free fluid ascites. Curvilinear hyperdensities along the right pericolic gutter and inferior to the right hepatic lobe of unclear etiology. No intraperitoneal free gas. No organized fluid collection. Musculoskeletal: No abdominal wall hernia or abnormality. No suspicious lytic or blastic osseous lesions. No acute displaced fracture. IMPRESSION: 1. Markedly limited evaluation for bleed due to single portal venous phase study obtained. However, no anterior abdominal wall hematoma identified. 2. Cirrhosis with portal hypertension. Diffusely heterogeneous hepatic parenchyma with suggestion of multiple poorly defined hypodense lesions concerning for underlying malignancy. Recommend MRI liver protocol for further evaluation. 3. Small to moderate volume simple free fluid ascites. 4. Bilateral trace pleural effusions. 5. Uterine fibroid. Electronically Signed   By: Tish Frederickson M.D.   On: 07/23/2021 22:55    Anti-infectives: Anti-infectives (From admission, onward)    Start     Dose/Rate Route Frequency Ordered Stop   07/24/21 0930  ceFAZolin (ANCEF) IVPB 2g/100 mL premix        2 g 200 mL/hr over 30 Minutes Intravenous To [facility] 07/24/21 2060 07/24/21 1258        Assessment/Plan  Abdominal wall bleeding from paracentesis site -POD1 wound exploration and ligation of bleeding tissue 8/31 Dr. Corliss Skains -CT 8/30 - without vessel identified but no anterior abdominal wall hematoma - Hgb stable at 7.2 (7.3) but still some slow ooze from site today though per nursing had started very recently. Continue to monitor - bid dressing changes with dry guaze and bacitracin   FEN - HH VTE - on hold due to bleeding ID - ancef periop    LOS: 14 days    Eric Form, [facility] 07/25/2021, 7:25 AM Please see Amion for pager number during day hours 7:00am-4:30pm

## 2021-07-25 NOTE — Plan of Care (Signed)
  Problem: Health Behavior/Discharge Planning: Goal: Ability to manage health-related needs will improve Outcome: Progressing   Problem: Clinical Measurements: Goal: Ability to maintain clinical measurements within normal limits will improve Outcome: Progressing Goal: Will remain free from infection Outcome: Progressing Goal: Respiratory complications will improve Outcome: Progressing Goal: Cardiovascular complication will be avoided Outcome: Progressing   Problem: Nutrition: Goal: Adequate nutrition will be maintained Outcome: Progressing   Problem: Safety: Goal: Ability to remain free from injury will improve Outcome: Progressing   Problem: Skin Integrity: Goal: Risk for impaired skin integrity will decrease Outcome: Progressing   Problem: Activity: Goal: Risk for activity intolerance will decrease Outcome: Adequate for Discharge   Problem: Elimination: Goal: Will not experience complications related to bowel motility Outcome: Adequate for Discharge   Problem: Pain Managment: Goal: General experience of comfort will improve Outcome: Adequate for Discharge

## 2021-07-25 NOTE — Plan of Care (Signed)

## 2021-07-25 NOTE — Plan of Care (Signed)
  Problem: Health Behavior/Discharge Planning: Goal: Ability to manage health-related needs will improve Outcome: Not Progressing   Problem: Clinical Measurements: Goal: Ability to maintain clinical measurements within normal limits will improve Outcome: Not Progressing   Problem: Coping: Goal: Level of anxiety will decrease Outcome: Not Progressing

## 2021-07-26 LAB — CBC
HCT: 25.1 % — ABNORMAL LOW (ref 36.0–46.0)
Hemoglobin: 8.4 g/dL — ABNORMAL LOW (ref 12.0–15.0)
MCH: 32.6 pg (ref 26.0–34.0)
MCHC: 33.5 g/dL (ref 30.0–36.0)
MCV: 97.3 fL (ref 80.0–100.0)
Platelets: 115 10*3/uL — ABNORMAL LOW (ref 150–400)
RBC: 2.58 MIL/uL — ABNORMAL LOW (ref 3.87–5.11)
RDW: 24.7 % — ABNORMAL HIGH (ref 11.5–15.5)
WBC: 17.8 10*3/uL — ABNORMAL HIGH (ref 4.0–10.5)
nRBC: 0.2 % (ref 0.0–0.2)

## 2021-07-26 LAB — PREPARE RBC (CROSSMATCH)

## 2021-07-26 LAB — HEMOGLOBIN AND HEMATOCRIT, BLOOD
HCT: 20.2 % — ABNORMAL LOW (ref 36.0–46.0)
HCT: 22.3 % — ABNORMAL LOW (ref 36.0–46.0)
Hemoglobin: 6.6 g/dL — CL (ref 12.0–15.0)
Hemoglobin: 7.4 g/dL — ABNORMAL LOW (ref 12.0–15.0)

## 2021-07-26 MED ORDER — SODIUM CHLORIDE 0.9% IV SOLUTION: Freq: Once | INTRAVENOUS | Status: AC

## 2021-07-26 MED ORDER — SODIUM CHLORIDE 0.9% IV SOLUTION: Freq: Once | INTRAVENOUS | Status: DC

## 2021-07-26 NOTE — Progress Notes (Signed)
PROGRESS NOTE  Michelle Little  WKG:[id number] DOB: 03/05/1986 DOA: 07/11/2021 PCP: Pcp, No   Brief Narrative: Michelle Little is a 35 y.o. female who was admitted at [facility] 7/25 - 8/7 due to hepatic encephalopathy and volume overload due to decompensated alcoholic cirrhosis, also noted to be profoundly anemic which stabilized after 6u PRBCs. Paracentesis was performed 7/27 felt to be consistent with portal HTN. She was discharged after diuresis and normalized mentation. She noted bleeding from paracentesis site on 8/14 prompting return to ED 8/14, 8/15, 8/16, and 8/18 with downtrending hgb to 6.5g/dl, ultimately was convinced to stay in the hospital and admitted 8/19. INR stable at 2.5. 6 additional units PRBCs total have been transfused this admission, in addition to 7 units of FFP and continued vitamin K without improvement in INR and the site continued bleeding despite stitching, pressure, silver nitrate, dermabond, etc. Surgery was consulted, took to OR for exploration 8/31. Subcutaneous clamping was performed with hemostasis and this area was ligated with silk sutures. Hgb remained low and bleeding persisted. 1u PRBCs and platelets were transfused 9/2.  Assessment & Plan: Principal Problem:   Acute blood loss anemia Active Problems:   Acute on chronic anemia   Encephalopathy, hepatic (HCC)   Cirrhosis (HCC)   Wound dehiscence  Michelle Little is a 35yo F with decompensated alcoholic cirrhosis with associated ascites, coagulopathy, hyperbilirubinemia, and recent history of hepatic encephalopathy. Her coagulopathy has led to persistent bleeding and anemia despite significant FFP and RBC transfusion support. Wound exploration and ligation of bleeding area has been performed.   Acute blood loss anemia: Due to ongoing hemorrhage in setting of coagulopathy, suspected to be due to bleeding dilated superficial vessel due to portal HTN.  - s/p ligation 8/31. Topical surgicel applied 9/1. Bleeding improved  though hgb trended <7, so gave RBCs this AM. Recheck this PM.  - Continue vitamin K. INR remains elevated at 3. - Thrombocytopenia improved after platelet transfusion per surgery.  Decompensated alcoholic cirrhosis:  - Continue lasix, spironolactone if BP will tolerate. Weight is stable near presumed best dry weight ~66kg. - Continue lactulose. Ammonia elevated, though mentation is stable. - GI consulted this admission, recommends outpatient follow up. Guarded prognosis given her severity at this time. - CT performed revealing nonspecific hepatic densities. MRI recommended and ordered, though patient has declined.  AKI: Mild, resolved.    Hyponatremia: Due to cirrhosis. Stable.   DVT prophylaxis: SCDs Code Status: Full Family Communication: None at bedside Disposition Plan:  Status is: Inpatient  Remains inpatient appropriate because:Hemodynamically unstable and Inpatient level of care appropriate due to severity of illness  Dispo: The patient is from: Home              Anticipated d/c is to: Home              Patient currently is not medically stable to d/c.   Difficult to place patient No  Consultants:  GI Surgery  Procedures:  Wound exploration, ligation by Dr. Corliss Skains 07/24/2021  Antimicrobials: None   Subjective: Very aggravated by her continued need to be in the hospital. Blood counts declined overnight prompting transfusions of blood and platelets, though bleeding at the site has stopped as of this morning. No abd pain, fever.   Objective: Vitals:   07/26/21 1029 07/26/21 1046 07/26/21 1100 07/26/21 1616  BP: 113/73  106/63 108/66  Pulse: (!) 105 (!) 102 93 87  Resp: 18 18 18 15   Temp: 98.2 F (36.8 C) 98.2 F (36.8 C)  98.2 F (36.8 C) 98 F (36.7 C)  TempSrc: Oral Oral Oral Oral  SpO2: 99% 96% 96% 95%  Weight:      Height:        Intake/Output Summary (Last 24 hours) at 07/26/2021 1728 Last data filed at 07/26/2021 1100 Gross per 24 hour  Intake 851 ml   Output --  Net 851 ml   Filed Weights   07/24/21 0408 07/24/21 1133 07/26/21 0422  Weight: 67 kg 65.8 kg 68.6 kg   Gen: 35 y.o. female in no distress Pulm: Nonlabored breathing room air. Clear. CV: Regular rate and rhythm. No murmur, rub, or gallop. No JVD, stable significant diffuse edema GI: Abdomen soft, non-tender, non-distended, with normoactive bowel sounds.  Ext: Warm, no deformities Skin: No new rashes, lesions or ulcers on visualized skin. RLQ site's dressing is c/d/I, when uncovered very minimal dried blood noted. Neuro: Alert and oriented. No asterixis or focal neurological deficits. Psych: Judgement and insight appear adequate to make informed medical decisions.   Data Reviewed: I have personally reviewed following labs and imaging studies  CBC: Recent Labs  Lab 07/21/21 0036 07/21/21 1130 07/23/21 0657 07/24/21 0048 07/25/21 0033 07/26/21 0043 07/26/21 1001 07/26/21 1447  WBC 14.0*  --  12.0* 11.1* 10.8*  --   --  17.8*  HGB 6.8*   < > 7.3* 7.3* 7.2* 6.6* 7.4* 8.4*  HCT 20.3*   < > 21.7* 21.7* 21.1* 20.2* 22.3* 25.1*  MCV 99.0  --  97.3 97.7 99.5  --   --  97.3  PLT 102*  --  96* 86* 87*  --   --  115*   < > = values in this interval not displayed.   Basic Metabolic Panel: Recent Labs  Lab 07/23/21 0657 07/24/21 0048  NA 130* 130*  K 3.9 3.9  CL 106 108  CO2 20* 18*  GLUCOSE 95 110*  BUN 11 10  CREATININE 0.89 0.89  CALCIUM 7.5* 7.4*  MG  --  1.8   GFR: Estimated Creatinine Clearance: 82.4 mL/min (by C-G formula based on SCr of 0.89 mg/dL). Liver Function Tests: Recent Labs  Lab 07/24/21 0048  AST 51*  ALT 24  ALKPHOS 163*  BILITOT 15.7*  PROT 6.4*  ALBUMIN 1.4*   No results for input(s): LIPASE, AMYLASE in the last 168 hours. No results for input(s): AMMONIA in the last 168 hours. Coagulation Profile: Recent Labs  Lab 07/20/21 0051 07/21/21 0036 07/22/21 0015 07/24/21 0048 07/25/21 0033  INR 2.5* 2.9* 2.9* 3.1* 3.0*   Cardiac  Enzymes: No results for input(s): CKTOTAL, CKMB, CKMBINDEX, TROPONINI in the last 168 hours. BNP (last 3 results) No results for input(s): PROBNP in the last 8760 hours. HbA1C: No results for input(s): HGBA1C in the last 72 hours. CBG: Recent Labs  Lab 07/21/21 0955  GLUCAP 91   Lipid Profile: No results for input(s): CHOL, HDL, LDLCALC, TRIG, CHOLHDL, LDLDIRECT in the last 72 hours. Thyroid Function Tests: No results for input(s): TSH, T4TOTAL, FREET4, T3FREE, THYROIDAB in the last 72 hours. Anemia Panel: No results for input(s): VITAMINB12, FOLATE, FERRITIN, TIBC, IRON, RETICCTPCT in the last 72 hours. Urine analysis:    Component Value Date/Time   COLORURINE AMBER (A) 06/17/2021 1011   APPEARANCEUR HAZY (A) 06/17/2021 1011   LABSPEC 1.020 06/17/2021 1011   PHURINE 5.0 06/17/2021 1011   GLUCOSEU NEGATIVE 06/17/2021 1011   HGBUR NEGATIVE 06/17/2021 1011   BILIRUBINUR LARGE (A) 06/17/2021 1011   KETONESUR 15 (A) 06/17/2021 1011  PROTEINUR 30 (A) 06/17/2021 1011   NITRITE NEGATIVE 06/17/2021 1011   LEUKOCYTESUR MODERATE (A) 06/17/2021 1011     Scheduled Meds:  sodium chloride   Intravenous Once   bacitracin   Topical BID   folic acid  1 mg Oral Daily   furosemide  20 mg Oral BID   lactulose  10 g Oral Daily   magnesium oxide  800 mg Oral BID   multivitamin with minerals  1 tablet Oral Daily   phytonadione  10 mg Oral q1800   spironolactone  100 mg Oral Daily   thiamine  100 mg Oral Daily   Continuous Infusions:   LOS: 15 days   Time spent: 35 minutes.  Tyrone Nine, MD [facility] [email or web address] 07/26/2021, 5:28 PM

## 2021-07-26 NOTE — Progress Notes (Signed)
Patient's hemoglobin came back at 6.6. Hospitalist notified, 1 unit of PRBC's ordered and given. Dressing to abdominal site is clean, dry and intact.

## 2021-07-26 NOTE — Progress Notes (Signed)
18:15 Pt left against medical advice. Aggravated about spending another night in the hospital.

## 2021-07-26 NOTE — Progress Notes (Signed)
Date and time results received: 07/26/21 at 0215 (use smartphrase ".now" to insert current time)  Test: Hgb Critical Value: 6.6  Name of Provider Notified: Monica Becton, MD  Orders Received? Or Actions Taken?:Pending

## 2021-07-26 NOTE — Discharge Summary (Signed)
Physician Discharge Summary  Michelle Little BWG:[id number] DOB: 17-Aug-1986 DOA: 07/11/2021  PCP: Pcp, No  Admit date: 07/11/2021 Discharge date: 07/26/2021  Admitted From: Home Disposition: Left home (shelter) against medical advice   Recommendations for Outpatient Follow-up:  Follow up with PCP and GI with repeat CBC and assessment of RLQ abdominal wound. Due to decompensated alcoholic cirrhosis with many complications, prognosis is guarded and the patient remains a very high readmission risk. We confirmed she had her home medications. She left against medical advice on 9/2, at which time she was competent to make medical decisions.  Home Health: None new Equipment/Devices: None new Discharge Condition: Improved but not stable for discharge CODE STATUS: Full Diet recommendation: Heart healthy  Brief/Interim Summary: Michelle Little is a 35 y.o. female who was admitted at [facility] 7/25 - 8/7 due to hepatic encephalopathy and volume overload due to decompensated alcoholic cirrhosis, also noted to be profoundly anemic which stabilized after 6u PRBCs. Paracentesis was performed 7/27 felt to be consistent with portal HTN. She was discharged after diuresis and normalized mentation. She noted bleeding from paracentesis site on 8/14 prompting return to ED 8/14, 8/15, 8/16, and 8/18 with downtrending hgb to 6.5g/dl, ultimately was convinced to stay in the hospital and admitted 8/19. INR stable at 2.5. 6 additional units PRBCs total have been transfused this admission, in addition to 7 units of FFP and continued vitamin K without improvement in INR and the site continued bleeding despite stitching, pressure, silver nitrate, dermabond, etc. Surgery was consulted, took to OR for exploration 8/31. Subcutaneous clamping was performed with hemostasis and this area was ligated with silk sutures. Hgb remained low and bleeding persisted until 9/2. 1u PRBCs and platelets were transfused 9/2 after which the patient left  against medical advice.   Discharge Diagnoses:  Principal Problem:   Acute blood loss anemia Active Problems:   Acute on chronic anemia   Encephalopathy, hepatic (HCC)   Cirrhosis (HCC)   Wound dehiscence  [name] is a 35yo F with decompensated alcoholic cirrhosis with associated ascites, coagulopathy, hyperbilirubinemia, and recent history of hepatic encephalopathy. Her coagulopathy has led to persistent bleeding and anemia despite significant FFP and RBC transfusion support. Wound exploration and ligation of bleeding area has been performed.    Acute blood loss anemia: Due to ongoing hemorrhage in setting of coagulopathy, suspected to be due to bleeding dilated superficial vessel due to portal HTN.  - s/p ligation 8/31. Topical surgicel applied 9/1. Bleeding improved though hgb trended <7, so gave RBCs this AM.  - Continue vitamin K. INR remains elevated at 3. - Thrombocytopenia improved after platelet transfusion per surgery.   Decompensated alcoholic cirrhosis:  - Continue lasix, spironolactone if BP will tolerate. Weight is stable near presumed best dry weight ~66kg. - Continue lactulose. Ammonia elevated, though mentation is stable. - GI consulted this admission, recommends outpatient follow up. Guarded prognosis given her severity at this time. - CT performed revealing nonspecific hepatic densities. MRI recommended and ordered, though patient has declined.   AKI: Mild, resolved.     Hyponatremia: Due to cirrhosis. Stable.  Leukocytosis: Unclear etiology, though possibly reactive to recent surgery. No fever or focal nidus of infection.  - Low threshold to culture if febrile and/or abdominal pain develops since at risk for SBP.   Discharge Instructions Discharge Instructions     Diet - low sodium heart healthy   Complete by: As directed    Discharge instructions   Complete by: As directed    Follow-up  with your primary care physician in 1 week.  Continue Ace bandage wrap  for next 2 to 3 days.  Do not pull the gauze  but let it soak with water and fall off.  Apply dressing and Ace bandage wrap if you notice any bleeding.  Seek medical attention if worsening bleeding.  Please check your blood pressure and do not take Lasix or spironolactone if your systolic blood pressure is less than 100.   Discharge wound care:   Complete by: As directed    Leave the ACE compression bandage for next 3 days.   Increase activity slowly   Complete by: As directed       Allergies as of 07/26/2021   No Known Allergies      Medication List     TAKE these medications    folic acid 1 MG tablet Commonly known as: FOLVITE Take 1 tablet (1 mg total) by mouth daily.   furosemide 20 MG tablet Commonly known as: LASIX Take 1 tablet (20 mg total) by mouth 2 (two) times daily. Do not take if systolic blood pressure is less than 100 What changed: additional instructions   lactulose 10 g packet Commonly known as: CEPHULAC Take 1 packet (10 g total) by mouth 3 (three) times daily.   magnesium oxide 400 (240 Mg) MG tablet Commonly known as: MAG-OX Take 2 tablets (800 mg total) by mouth 2 (two) times daily.   multivitamin with minerals Tabs tablet Take 1 tablet by mouth daily.   spironolactone 100 MG tablet Commonly known as: ALDACTONE Take 1 tablet (100 mg total) by mouth daily. Do not take if systolic blood pressure is less than 100 What changed: additional instructions   thiamine 100 MG tablet Take 1 tablet (100 mg total) by mouth daily.   vitamin k 100 MCG tablet Take 1 tablet (100 mcg total) by mouth daily for 5 days.               Discharge Care Instructions  (From admission, onward)           Start     Ordered   07/23/21 0000  Discharge wound care:       Comments: Leave the ACE compression bandage for next 3 days.   07/23/21 0858            Follow-up Information     Primary care provider. Schedule an appointment as soon as possible for a  visit in 1 week(s).          Grayce Sessions, NP Follow up on 08/02/2021.   Specialty: Internal Medicine Why: 9:30 for hospital follow up Contact information: [address] [place] [address] [phone]         [facility] Follow up.   Why: please go to the pharmacy here for your refills Mon- Friday. Contact information: [address] [place] [address] [phone]               No Known Allergies  Consultations: GI General surgery  Procedures/Studies: CT ABDOMEN PELVIS W CONTRAST  Result Date: 07/23/2021 CLINICAL DATA:  Persistent abdominal wall bleeding s/p paracentesis over a month ago. Eval for which vessel may be bleeding EXAM: CT ABDOMEN AND PELVIS WITH CONTRAST TECHNIQUE: Multidetector CT imaging of the abdomen and pelvis was performed using the standard protocol following bolus administration of intravenous contrast. CONTRAST:  17mL OMNIPAQUE IOHEXOL 350 MG/ML SOLN COMPARISON:  CT abdomen pelvis 06/17/2021. FINDINGS: Lower  chest: Bilateral trace, right greater than left, pleural effusions. Hepatobiliary: Nodular hepatic contour. Innumerable poorly defined hepatic hypodensities. Contracted gallbladder. No CT evidence of gallstones. No biliary dilatation. Pancreas: No focal lesion. Normal pancreatic contour. No surrounding inflammatory changes. No main pancreatic ductal dilatation. Spleen: Normal in size without focal abnormality. Adrenals/Urinary Tract: No adrenal nodule bilaterally. Bilateral kidneys enhance symmetrically. No hydronephrosis. No hydroureter. The urinary bladder is unremarkable. Stomach/Bowel: Stomach is within normal limits. No evidence of bowel wall thickening or dilatation. Appendix appears normal. Vascular/Lymphatic: Patent main portal, splenic, superior mesenteric veins. Multiple venous collaterals are identified including a recanalized paraumbilical vein. No abdominal aorta or iliac  aneurysm. No abdominal, pelvic, or inguinal lymphadenopathy. Reproductive: Peripherally calcified lesion within the uterus measuring up to 5.5 cm likely representing a degenerative fibroid. Otherwise the uterus and bilateral ovaries/adnexal regions are unremarkable. Other: Small to moderate simple free fluid ascites. Curvilinear hyperdensities along the right pericolic gutter and inferior to the right hepatic lobe of unclear etiology. No intraperitoneal free gas. No organized fluid collection. Musculoskeletal: No abdominal wall hernia or abnormality. No suspicious lytic or blastic osseous lesions. No acute displaced fracture. IMPRESSION: 1. Markedly limited evaluation for bleed due to single portal venous phase study obtained. However, no anterior abdominal wall hematoma identified. 2. Cirrhosis with portal hypertension. Diffusely heterogeneous hepatic parenchyma with suggestion of multiple poorly defined hypodense lesions concerning for underlying malignancy. Recommend MRI liver protocol for further evaluation. 3. Small to moderate volume simple free fluid ascites. 4. Bilateral trace pleural effusions. 5. Uterine fibroid. Electronically Signed   By: Tish FredericksonMorgane  Naveau M.D.   On: 07/23/2021 22:55   US Abdomen Limited  Result Date: 07/13/2021 CLINICAL DATA:  History of cirrhosis.  Assess for ascites. EXAM: LIMITED ABDOMEN ULTRASOUND FOR ASCITES TECHNIQUE: Limited ultrasound survey for ascites was performed in all four abdominal quadrants. COMPARISON:  06/18/2021 FINDINGS: Small amount of ascites noted adjacent to the cirrhotic liver and in both lower quadrants. IMPRESSION: Small amount of ascites. Electronically Signed   By: Amie Portlandavid  Ormond M.D.   On: 07/13/2021 14:01    Wound exploration, ligation by Dr. Corliss Skainssuei 07/24/2021  Subjective: Very aggravated by her continued need to be in the hospital. Blood counts declined overnight prompting transfusions of blood and platelets, though bleeding at the site has stopped as  of this morning. No abd pain, fever.   Discharge Exam: Vitals:   07/26/21 1100 07/26/21 1616  BP: 106/63 108/66  Pulse: 93 87  Resp: 18 15  Temp: 98.2 F (36.8 C) 98 F (36.7 C)  SpO2: 96% 95%   Gen: 35 y.o. female in no distress Pulm: Nonlabored breathing room air. Clear. CV: Regular rate and rhythm. No murmur, rub, or gallop. No JVD, stable significant diffuse edema GI: Abdomen soft, non-tender, non-distended, with normoactive bowel sounds.  Ext: Warm, no deformities Skin: No new rashes, lesions or ulcers on visualized skin. RLQ site's dressing is c/d/I, when uncovered very minimal dried blood noted. Neuro: Alert and oriented. No asterixis or focal neurological deficits. Psych: Judgement and insight appear adequate to make informed medical decisions.   Labs: BNP (last 3 results) No results for input(s): BNP in the last 8760 hours. Basic Metabolic Panel: Recent Labs  Lab 07/23/21 0657 07/24/21 0048  NA 130* 130*  K 3.9 3.9  CL 106 108  CO2 20* 18*  GLUCOSE 95 110*  BUN 11 10  CREATININE 0.89 0.89  CALCIUM 7.5* 7.4*  MG  --  1.8   Liver Function Tests: Recent Labs  Lab 07/24/21 0048  AST 51*  ALT 24  ALKPHOS 163*  BILITOT 15.7*  PROT 6.4*  ALBUMIN 1.4*   No results for input(s): LIPASE, AMYLASE in the last 168 hours. No results for input(s): AMMONIA in the last 168 hours. CBC: Recent Labs  Lab 07/21/21 0036 07/21/21 1130 07/23/21 0657 07/24/21 0048 07/25/21 0033 07/26/21 0043 07/26/21 1001 07/26/21 1447  WBC 14.0*  --  12.0* 11.1* 10.8*  --   --  17.8*  HGB 6.8*   < > 7.3* 7.3* 7.2* 6.6* 7.4* 8.4*  HCT 20.3*   < > 21.7* 21.7* 21.1* 20.2* 22.3* 25.1*  MCV 99.0  --  97.3 97.7 99.5  --   --  97.3  PLT 102*  --  96* 86* 87*  --   --  115*   < > = values in this interval not displayed.   Cardiac Enzymes: No results for input(s): CKTOTAL, CKMB, CKMBINDEX, TROPONINI in the last 168 hours. BNP: Invalid input(s): POCBNP CBG: Recent Labs  Lab  07/21/21 0955  GLUCAP 91   D-Dimer No results for input(s): DDIMER in the last 72 hours. Hgb A1c No results for input(s): HGBA1C in the last 72 hours. Lipid Profile No results for input(s): CHOL, HDL, LDLCALC, TRIG, CHOLHDL, LDLDIRECT in the last 72 hours. Thyroid function studies No results for input(s): TSH, T4TOTAL, T3FREE, THYROIDAB in the last 72 hours.  Invalid input(s): FREET3 Anemia work up No results for input(s): VITAMINB12, FOLATE, FERRITIN, TIBC, IRON, RETICCTPCT in the last 72 hours. Urinalysis    Component Value Date/Time   COLORURINE AMBER (A) 06/17/2021 1011   APPEARANCEUR HAZY (A) 06/17/2021 1011   LABSPEC 1.020 06/17/2021 1011   PHURINE 5.0 06/17/2021 1011   GLUCOSEU NEGATIVE 06/17/2021 1011   HGBUR NEGATIVE 06/17/2021 1011   BILIRUBINUR LARGE (A) 06/17/2021 1011   KETONESUR 15 (A) 06/17/2021 1011   PROTEINUR 30 (A) 06/17/2021 1011   NITRITE NEGATIVE 06/17/2021 1011   LEUKOCYTESUR MODERATE (A) 06/17/2021 1011    Microbiology No results found for this or any previous visit (from the past 240 hour(s)).  Time coordinating discharge: Approximately 40 minutes  Tyrone Nine, MD  [facility] 07/27/2021, 3:29 PM

## 2021-07-26 NOTE — Progress Notes (Addendum)
   Progress Note  2 Days Post-Op  Subjective: No further significant bleeding from wound after placement of surgicel yesterday afternoon. She just received 2 units of blood. Eager to go home. No complaints  Objective: Vital signs in last 24 hours: Temp:  [97.7 F (36.5 C)-98.4 F (36.9 C)] 98.2 F (36.8 C) (09/02 0722) Pulse Rate:  [93-110] 93 (09/02 0722) Resp:  [16-21] 18 (09/02 0722) BP: (103-112)/(65-75) 106/74 (09/02 0722) SpO2:  [97 %-99 %] 99 % (09/02 0722) Weight:  [68.6 kg] 68.6 kg (09/02 0422) Last BM Date: 07/24/21  Intake/Output from previous day: 09/01 0701 - 09/02 0700 In: 3 [I.V.:3] Out: -  Intake/Output this shift: Total I/O In: 293 [Blood:293] Out: -   PE: General: pleasant, WD, female who is sitting in bed in NAD HEENT: head is normocephalic, atraumatic.  Scleral icterus.  Mouth is pink and moist Lungs: Respiratory effort nonlabored Abd: soft, NT to palpation. RLQ wound with bandage c/d/I and removed for exam - bandage with small amount of blood and site without current bleeding. Wound margins pink and healthy. No erythema or discharge MSK: bilateral lower extremity edema Skin: warm and dry Psych: A&Ox3 with an appropriate affect.    Lab Results:  Recent Labs    07/24/21 0048 07/25/21 0033 07/26/21 0043  WBC 11.1* 10.8*  --   HGB 7.3* 7.2* 6.6*  HCT 21.7* 21.1* 20.2*  PLT 86* 87*  --     BMET Recent Labs    07/24/21 0048  NA 130*  K 3.9  CL 108  CO2 18*  GLUCOSE 110*  BUN 10  CREATININE 0.89  CALCIUM 7.4*    PT/INR Recent Labs    07/24/21 0048 07/25/21 0033  LABPROT 31.6* 31.4*  INR 3.1* 3.0*    CMP     Component Value Date/Time   NA 130 (L) 07/24/2021 0048   K 3.9 07/24/2021 0048   CL 108 07/24/2021 0048   CO2 18 (L) 07/24/2021 0048   GLUCOSE 110 (H) 07/24/2021 0048   BUN 10 07/24/2021 0048   CREATININE 0.89 07/24/2021 0048   CALCIUM 7.4 (L) 07/24/2021 0048   PROT 6.4 (L) 07/24/2021 0048   ALBUMIN 1.4 (L)  07/24/2021 0048   AST 51 (H) 07/24/2021 0048   ALT 24 07/24/2021 0048   ALKPHOS 163 (H) 07/24/2021 0048   BILITOT 15.7 (H) 07/24/2021 0048   GFRNONAA >60 07/24/2021 0048   Lipase  No results found for: LIPASE     Studies/Results: No results found.  Anti-infectives: Anti-infectives (From admission, onward)    Start     Dose/Rate Route Frequency Ordered Stop   07/24/21 0930  ceFAZolin (ANCEF) IVPB 2g/100 mL premix        2 g 200 mL/hr over 30 Minutes Intravenous To [facility] 07/24/21 3382 07/24/21 1258        Assessment/Plan  Abdominal wall bleeding from paracentesis site -POD2 wound exploration and ligation of bleeding tissue 8/31 Dr. Corliss Skains -CT 8/30 - without vessel identified but no anterior abdominal wall hematoma - Hgb down to 6.6 and just received 2uprbc. Will also give 1 u platelets for plt of 87k - bid dressing changes with dry guaze and bacitracin. Leave surgicel guaze in wound base.   FEN - HH VTE - on hold due to bleeding ID - ancef periop    LOS: 15 days    Eric Form, [facility] 07/26/2021, 7:39 AM Please see Amion for pager number during day hours 7:00am-4:30pm

## 2021-07-27 LAB — BPAM PLATELET PHERESIS
Blood Product Expiration Date: 202209022359
ISSUE DATE / TIME: 202209021012
Unit Type and Rh: 6200

## 2021-07-27 LAB — TYPE AND SCREEN
ABO/RH(D): B POS
Antibody Screen: NEGATIVE
Unit division: 0

## 2021-07-27 LAB — PREPARE PLATELET PHERESIS: Unit division: 0

## 2021-07-27 LAB — BPAM RBC
Blood Product Expiration Date: 202209222359
ISSUE DATE / TIME: 202209020439
Unit Type and Rh: 7300

## 2021-07-30 LAB — SURGICAL PATHOLOGY

## 2021-08-02 ENCOUNTER — Inpatient Hospital Stay (INDEPENDENT_AMBULATORY_CARE_PROVIDER_SITE_OTHER): Payer: Self-pay | Admitting: Primary Care

## 2021-08-07 ENCOUNTER — Encounter (HOSPITAL_COMMUNITY): Payer: Self-pay

## 2021-08-07 ENCOUNTER — Emergency Department (HOSPITAL_COMMUNITY)
Admission: EM | Admit: 2021-08-07 | Discharge: 2021-08-07 | Disposition: A | Discharge status: Home / Self Care | Payer: Medicaid Other | Attending: Emergency Medicine | Admitting: Emergency Medicine

## 2021-08-07 DIAGNOSIS — Z4801 Encounter for change or removal of surgical wound dressing: Secondary | ICD-10-CM | POA: Insufficient documentation

## 2021-08-07 DIAGNOSIS — L7622 Postprocedural hemorrhage and hematoma of skin and subcutaneous tissue following other procedure: Secondary | ICD-10-CM | POA: Diagnosis not present

## 2021-08-07 DIAGNOSIS — Z5189 Encounter for other specified aftercare: Secondary | ICD-10-CM

## 2021-08-07 DIAGNOSIS — R000 Tachycardia, unspecified: Secondary | ICD-10-CM | POA: Insufficient documentation

## 2021-08-07 LAB — COMPREHENSIVE METABOLIC PANEL
ALT: 39 U/L (ref 0–44)
AST: 68 U/L — ABNORMAL HIGH (ref 15–41)
Albumin: 1.8 g/dL — ABNORMAL LOW (ref 3.5–5.0)
Alkaline Phosphatase: 282 U/L — ABNORMAL HIGH (ref 38–126)
Anion gap: 11 (ref 5–15)
BUN: 7 mg/dL (ref 6–20)
CO2: 18 mmol/L — ABNORMAL LOW (ref 22–32)
Calcium: 8.2 mg/dL — ABNORMAL LOW (ref 8.9–10.3)
Chloride: 103 mmol/L (ref 98–111)
Creatinine, Ser: 0.96 mg/dL (ref 0.44–1.00)
GFR, Estimated: >60 mL/min (ref >60)
Glucose, Bld: 99 mg/dL (ref 70–99)
Potassium: 3.8 mmol/L (ref 3.5–5.1)
Sodium: 132 mmol/L — ABNORMAL LOW (ref 135–145)
Total Bilirubin: 11.2 mg/dL — ABNORMAL HIGH (ref 0.3–1.2)
Total Protein: 7.2 g/dL (ref 6.5–8.1)

## 2021-08-07 LAB — CBC WITH DIFFERENTIAL/PLATELET
Abs Immature Granulocytes: 0.13 10*3/uL — ABNORMAL HIGH (ref 0.00–0.07)
Basophils Absolute: 0.1 10*3/uL (ref 0.0–0.1)
Basophils Relative: 1 %
Eosinophils Absolute: 0.1 10*3/uL (ref 0.0–0.5)
Eosinophils Relative: 1 %
HCT: 24.7 % — ABNORMAL LOW (ref 36.0–46.0)
Hemoglobin: 7.7 g/dL — ABNORMAL LOW (ref 12.0–15.0)
Immature Granulocytes: 1 %
Lymphocytes Relative: 10 %
Lymphs Abs: 1.2 10*3/uL (ref 0.7–4.0)
MCH: 33 pg (ref 26.0–34.0)
MCHC: 31.2 g/dL (ref 30.0–36.0)
MCV: 106 fL — ABNORMAL HIGH (ref 80.0–100.0)
Monocytes Absolute: 1.3 10*3/uL — ABNORMAL HIGH (ref 0.1–1.0)
Monocytes Relative: 11 %
Neutro Abs: 8.9 10*3/uL — ABNORMAL HIGH (ref 1.7–7.7)
Neutrophils Relative %: 76 %
Platelets: 102 10*3/uL — ABNORMAL LOW (ref 150–400)
RBC: 2.33 MIL/uL — ABNORMAL LOW (ref 3.87–5.11)
RDW: 24.7 % — ABNORMAL HIGH (ref 11.5–15.5)
WBC: 11.7 10*3/uL — ABNORMAL HIGH (ref 4.0–10.5)
nRBC: 0.3 % — ABNORMAL HIGH (ref 0.0–0.2)

## 2021-08-07 LAB — PROTIME-INR
INR: 2.1 — ABNORMAL HIGH (ref 0.8–1.2)
Prothrombin Time: 23.4 seconds — ABNORMAL HIGH (ref 11.4–15.2)

## 2021-08-07 LAB — HEMOGLOBIN AND HEMATOCRIT, BLOOD
HCT: 26.8 % — ABNORMAL LOW (ref 36.0–46.0)
Hemoglobin: 8.4 g/dL — ABNORMAL LOW (ref 12.0–15.0)

## 2021-08-07 NOTE — ED Provider Notes (Signed)
[facility] EMERGENCY DEPARTMENT Provider Note   CSN: [id number] Arrival date & time: 08/07/21  0357     History Chief Complaint  Patient presents with   Wound Check    Michelle Little is a 35 y.o. female.  35 year old female with history of alcoholic cirrhosis presents with complaint of bleeding from prior paracentesis would site. Bleeding returned last night which prompted her to return to the ER. Denies fevers, chills. Patient left the hospital [facility] 9/2 after receiving multiple units of PRBC, FFP, vitamin K and surgery (07/24/21) where the bleeding was ultimately controlled with silk sutures.        Past Medical History:  Diagnosis Date   Cirrhosis, alcoholic (HCC)    Supratherapeutic INR    Thyroid disease     Patient Active Problem List   Diagnosis Date Noted   Wound dehiscence    Acute blood loss anemia 07/11/2021   Cirrhosis (HCC)    Acute on chronic anemia    Encephalopathy, hepatic (HCC)    Acute renal failure (HCC)    Other ascites    Acute encephalopathy 06/17/2021    Past Surgical History:  Procedure Laterality Date   INCISION AND DRAINAGE OF WOUND N/A 07/24/2021   Procedure: Abdominal Wound Exploration With Vessel Ligation;  Surgeon: Manus Rudd, MD;  Location: [facility] OR;  Service: General;  Laterality: N/A;     OB History   No obstetric history on file.     Family History  Family history unknown: Yes    Social History   Tobacco Use   Smoking status: Never   Smokeless tobacco: Never  Substance Use Topics   Alcohol use: Not Currently    Home Medications Prior to Admission medications   Medication Sig Start Date End Date Taking? Authorizing Provider  folic acid (FOLVITE) 1 MG tablet Take 1 tablet (1 mg total) by mouth daily. 06/30/21   Azucena Fallen, MD  furosemide (LASIX) 20 MG tablet Take 1 tablet (20 mg total) by mouth 2 (two) times daily. Do not take if systolic blood pressure is less than 100 07/23/21   Pokhrel,  Laxman, MD  lactulose (CEPHULAC) 10 g packet Take 1 packet (10 g total) by mouth 3 (three) times daily. 06/30/21   Azucena Fallen, MD  Multiple Vitamin (MULTIVITAMIN WITH MINERALS) TABS tablet Take 1 tablet by mouth daily. 06/30/21   Azucena Fallen, MD  spironolactone (ALDACTONE) 100 MG tablet Take 1 tablet (100 mg total) by mouth daily. Do not take if systolic blood pressure is less than 100 07/23/21   Pokhrel, Laxman, MD  thiamine 100 MG tablet Take 1 tablet (100 mg total) by mouth daily. 06/30/21   Azucena Fallen, MD    Allergies    Patient has no known allergies.  Review of Systems   Review of Systems  Physical Exam Updated Vital Signs BP 109/68   Pulse 90   Temp 98.1 F (36.7 C)   Resp 18   SpO2 98%   Physical Exam  ED Results / Procedures / Treatments   Labs (all labs ordered are listed, but only abnormal results are displayed) Labs Reviewed  PROTIME-INR - Abnormal; Notable for the following components:      Result Value   Prothrombin Time 23.4 (*)    INR 2.1 (*)    All other components within normal limits  CBC WITH DIFFERENTIAL/PLATELET - Abnormal; Notable for the following components:   WBC 11.7 (*)    RBC  2.33 (*)    Hemoglobin 7.7 (*)    HCT 24.7 (*)    MCV 106.0 (*)    RDW 24.7 (*)    Platelets 102 (*)    nRBC 0.3 (*)    Neutro Abs 8.9 (*)    Monocytes Absolute 1.3 (*)    Abs Immature Granulocytes 0.13 (*)    All other components within normal limits  COMPREHENSIVE METABOLIC PANEL - Abnormal; Notable for the following components:   Sodium 132 (*)    CO2 18 (*)    Calcium 8.2 (*)    Albumin 1.8 (*)    AST 68 (*)    Alkaline Phosphatase 282 (*)    Total Bilirubin 11.2 (*)    All other components within normal limits  HEMOGLOBIN AND HEMATOCRIT, BLOOD - Abnormal; Notable for the following components:   Hemoglobin 8.4 (*)    HCT 26.8 (*)    All other components within normal limits    EKG None  Radiology No results  found.  Procedures Procedures   Medications Ordered in ED Medications - No data to display  ED Course  I have reviewed the triage vital signs and the nursing notes.  Pertinent labs & imaging results that were available during my care of the patient were reviewed by me and considered in my medical decision making (see chart for details).  Clinical Course as of 08/07/21 1407  Wed Aug 07, 2021  [id number] 35 year old female with history of alcoholic cirrhosis presents with bleeding from RLQ wound. Previously admitted for same and left [facility] 07/26/21. Reports going to the bathroom last night, felt something wet and noticed she was bleeding.  On exam, has an approximately 2cm round would to RLQ with oozing at the base. Labs reviewed, hgb stable at 7.7, platelets 102. CMP without significant changes. INR 2.1. Pressure/gaze applied to area. Discussed with Dr. Particia Nearing, ER attending, recommends consult surgery.  Call to general surgery, spoke with Sherrie George, PA-C with surgery, recommends combat gauze and pressure dressing. Surgery will assess wound and discuss plan of care. Patient is NPO. [LM]  1406 Patient was seen by general surgery who has added additional pressure to her pressure dressing as well as an abdominal binder.  1 hour later on recheck, there is no additional bleeding, dressing is clean, dry, intact. Labs are stable.  Vital stable. Patient is advised to leave her bandage in place for the next 2 days, recommend recheck at urgent care if unable to be seen by surgery clinic.  Return to the ER at any time if bleeding through her dressing. [LM]    Clinical Course User Index [LM] [name]   MDM Rules/Calculators/A&P                           Final Clinical Impression(s) / ED Diagnoses Final diagnoses:  Visit for wound check    Rx / DC Orders ED Discharge Orders     None        Jeannie Fend, PA-C 08/07/21 1407    Jacalyn Lefevre, MD 08/07/21 1430

## 2021-08-07 NOTE — ED Provider Notes (Signed)
MSE was initiated and I personally evaluated the patient and placed orders (if any) at  4:02 AM on August 07, 2021.  Patient with h/o cirrhosis, abdominal wound presents with continued complication of healing wound. She reports it is again bleeding. No other symptoms. Per chart wound exploration by Tsuei 8/31, discharged home 9/2.   Today's Vitals   08/07/21 0359 08/07/21 0401  BP:  120/74  Pulse:  (!) 117  Resp:  18  Temp:  98.8 F (37.1 C)  TempSrc:  Oral  SpO2:  100%  PainSc: 6     There is no height or weight on file to calculate BMI.  VSS, in NAD. No obvious bleeding on cursory exam in triage setting.  The patient appears stable so that the remainder of the MSE may be completed by another provider.   Elpidio Anis, PA-C 08/07/21 0411    Shon Baton, MD 08/07/21 [phone]

## 2021-08-07 NOTE — Progress Notes (Signed)
    CC: Bleeding from paracentesis site  Subjective: Patient notes bleeding has been controlled at home up until today.  She said it started bleeding today and she could not stop it so she came to the ED. She is currently living in a shelter with her her son. Patient left the hospital [facility] 9/2 after receiving multiple units of PRBC, FFP, vitamin K and surgery (07/24/21) where the bleeding was ultimately controlled with silk sutures.   Objective: Vital signs in last 24 hours: Temp:  [98.1 F (36.7 C)-98.8 F (37.1 C)] 98.1 F (36.7 C) (09/14 2229) Pulse Rate:  [90-117] 90 (09/14 1225) Resp:  [15-18] 18 (09/14 1225) BP: (109-129)/(65-84) 109/68 (09/14 1225) SpO2:  [98 %-100 %] 98 % (09/14 1225)    Intake/Output from previous day: No intake/output data recorded. Intake/Output this shift: No intake/output data recorded.    General appearance: alert, cooperative, no distress, and hungry Eyes: jaundice Resp: Clear anterior GI:   Abdomen is distended, nontender.  Open site is about 1.5 cm in diameter.  She has some bleeding at about the 11 o'clock position which was oozing.  Patient says she just took out the dressing but site is clean and the skin around it is clean.  Open site has clean granular tissue throughout.  Lab Results:  Recent Labs    08/07/21 0406 08/07/21 1204  WBC 11.7*  --   HGB 7.7* 8.4*  HCT 24.7* 26.8*  PLT 102*  --     BMET Recent Labs    08/07/21 0406  NA 132*  K 3.8  CL 103  CO2 18*  GLUCOSE 99  BUN 7  CREATININE 0.96  CALCIUM 8.2*   PT/INR Recent Labs    08/07/21 0406  LABPROT 23.4*  INR 2.1*    Recent Labs  Lab 08/07/21 0406  AST 68*  ALT 39  ALKPHOS 282*  BILITOT 11.2*  PROT 7.2  ALBUMIN 1.8*     Lipase  No results found for: LIPASE   Medications:   Assessment/Plan Abdominal wall bleeding from paracentesis site Hx alcoholic cirrhosis with ascites Hx hepatic encephalopathy Elevated INR secondary to cirrhosis Hx  thyroid disease Acute on chronic anemia Homeless   Plan: We placed combat gauze along with a pressure dressing consisting of a Kerlix, tape and a modified binder.  Hopefully this will control it, if it does not control it we can try some cautery with silver nitrate sticks.  If that does not work she may ultimately need admission and medical management of her auto coagulation to control the site.  LOS: 0 days    Traylen Eckels 08/07/2021 Please see Amion

## 2021-08-07 NOTE — ED Provider Notes (Signed)
[facility] EMERGENCY DEPARTMENT Provider Note   CSN: [id number] Arrival date & time: 08/07/21  0357     History Chief Complaint  Patient presents with   Wound Check    Michelle Little is a 35 y.o. female.  35 year old female with history of alcoholic cirrhosis presents with complaint of bleeding from prior paracentesis wound site. Bleeding returned last night (noticed bleeding when she went to the bathroom, called 911) which prompted her to return to the ER. Has changed her dressing 1 time since surgery on 07/24/21. Has not followed up otherwise. Denies fevers, chills. Patient left the hospital [facility] 9/2 after receiving 6 units of PRBC, 7 U FFP, vitamin K and surgery (07/24/21) where the bleeding was ultimately controlled with silk sutures.        Past Medical History:  Diagnosis Date   Cirrhosis, alcoholic (HCC)    Supratherapeutic INR    Thyroid disease     Patient Active Problem List   Diagnosis Date Noted   Wound dehiscence    Acute blood loss anemia 07/11/2021   Cirrhosis (HCC)    Acute on chronic anemia    Encephalopathy, hepatic (HCC)    Acute renal failure (HCC)    Other ascites    Acute encephalopathy 06/17/2021    Past Surgical History:  Procedure Laterality Date   INCISION AND DRAINAGE OF WOUND N/A 07/24/2021   Procedure: Abdominal Wound Exploration With Vessel Ligation;  Surgeon: Manus Rudd, MD;  Location: [facility] OR;  Service: General;  Laterality: N/A;     OB History   No obstetric history on file.     Family History  Family history unknown: Yes    Social History   Tobacco Use   Smoking status: Never   Smokeless tobacco: Never  Substance Use Topics   Alcohol use: Not Currently    Home Medications Prior to Admission medications   Medication Sig Start Date End Date Taking? Authorizing Provider  folic acid (FOLVITE) 1 MG tablet Take 1 tablet (1 mg total) by mouth daily. 06/30/21   Azucena Fallen, MD  furosemide (LASIX) 20  MG tablet Take 1 tablet (20 mg total) by mouth 2 (two) times daily. Do not take if systolic blood pressure is less than 100 07/23/21   Pokhrel, Laxman, MD  lactulose (CEPHULAC) 10 g packet Take 1 packet (10 g total) by mouth 3 (three) times daily. 06/30/21   Azucena Fallen, MD  Multiple Vitamin (MULTIVITAMIN WITH MINERALS) TABS tablet Take 1 tablet by mouth daily. 06/30/21   Azucena Fallen, MD  spironolactone (ALDACTONE) 100 MG tablet Take 1 tablet (100 mg total) by mouth daily. Do not take if systolic blood pressure is less than 100 07/23/21   Pokhrel, Laxman, MD  thiamine 100 MG tablet Take 1 tablet (100 mg total) by mouth daily. 06/30/21   Azucena Fallen, MD    Allergies    Patient has no known allergies.  Review of Systems   Review of Systems  Constitutional:  Negative for chills and fever.  Respiratory:  Negative for shortness of breath.   Cardiovascular:  Negative for chest pain.  Gastrointestinal:  Negative for abdominal pain, nausea and vomiting.  Musculoskeletal:  Negative for arthralgias and myalgias.  Skin:  Positive for wound.  Neurological:  Negative for weakness and light-headedness.  Hematological:  Bruises/bleeds easily.  All other systems reviewed and are negative.  Physical Exam Updated Vital Signs BP 109/68   Pulse 90   Temp  98.1 F (36.7 C)   Resp 18   SpO2 98%   Physical Exam Vitals and nursing note reviewed.  Constitutional:      General: She is not in acute distress.    Appearance: She is well-developed. She is not diaphoretic.  HENT:     Head: Normocephalic and atraumatic.  Eyes:     General: Scleral icterus present.  Cardiovascular:     Rate and Rhythm: Regular rhythm. Tachycardia present.     Heart sounds: Normal heart sounds.  Pulmonary:     Effort: Pulmonary effort is normal.  Abdominal:     Palpations: Abdomen is soft.     Tenderness: There is no abdominal tenderness.  Skin:    General: Skin is warm and dry.     Findings: No  erythema or rash.       Neurological:     Mental Status: She is alert and oriented to person, place, and time.  Psychiatric:        Behavior: Behavior normal.         ED Results / Procedures / Treatments   Labs (all labs ordered are listed, but only abnormal results are displayed) Labs Reviewed  PROTIME-INR - Abnormal; Notable for the following components:      Result Value   Prothrombin Time 23.4 (*)    INR 2.1 (*)    All other components within normal limits  CBC WITH DIFFERENTIAL/PLATELET - Abnormal; Notable for the following components:   WBC 11.7 (*)    RBC 2.33 (*)    Hemoglobin 7.7 (*)    HCT 24.7 (*)    MCV 106.0 (*)    RDW 24.7 (*)    Platelets 102 (*)    nRBC 0.3 (*)    Neutro Abs 8.9 (*)    Monocytes Absolute 1.3 (*)    Abs Immature Granulocytes 0.13 (*)    All other components within normal limits  COMPREHENSIVE METABOLIC PANEL - Abnormal; Notable for the following components:   Sodium 132 (*)    CO2 18 (*)    Calcium 8.2 (*)    Albumin 1.8 (*)    AST 68 (*)    Alkaline Phosphatase 282 (*)    Total Bilirubin 11.2 (*)    All other components within normal limits  HEMOGLOBIN AND HEMATOCRIT, BLOOD - Abnormal; Notable for the following components:   Hemoglobin 8.4 (*)    HCT 26.8 (*)    All other components within normal limits    EKG None  Radiology No results found.  Procedures Procedures   Medications Ordered in ED Medications - No data to display  ED Course  I have reviewed the triage vital signs and the nursing notes.  Pertinent labs & imaging results that were available during my care of the patient were reviewed by me and considered in my medical decision making (see chart for details).  Clinical Course as of 08/07/21 1407  Wed Aug 07, 2021  [id number] 35 year old female with history of alcoholic cirrhosis presents with bleeding from RLQ wound. Previously admitted for same and left [facility] 07/26/21. Reports going to the bathroom last night,  felt something wet and noticed she was bleeding.  On exam, has an approximately 2cm round would to RLQ with oozing at the base. Labs reviewed, hgb stable at 7.7, platelets 102. CMP without significant changes. INR 2.1. Pressure/gaze applied to area. Discussed with Dr. Particia Nearing, ER attending, recommends consult surgery.  Call to general surgery, spoke with Yehuda Mao  Marlyne Beards, PA-C with surgery, recommends combat gauze and pressure dressing. Surgery will assess wound and discuss plan of care. Patient is NPO. [LM]  1406 Patient was seen by general surgery who has added additional pressure to her pressure dressing as well as an abdominal binder.  1 hour later on recheck, there is no additional bleeding, dressing is clean, dry, intact. Labs are stable.  Vital stable. Patient is advised to leave her bandage in place for the next 2 days, recommend recheck at urgent care if unable to be seen by surgery clinic.  Return to the ER at any time if bleeding through her dressing. [LM]    Clinical Course User Index [LM] [name]   MDM Rules/Calculators/A&P                           Final Clinical Impression(s) / ED Diagnoses Final diagnoses:  Visit for wound check    Rx / DC Orders ED Discharge Orders     None        Jeannie Fend, PA-C 08/07/21 1407    Jacalyn Lefevre, MD 08/07/21 1429

## 2021-08-07 NOTE — ED Triage Notes (Signed)
Pt comes for bleeding wound from a paracentesis that has been going on for a while and will not heal

## 2021-08-07 NOTE — Discharge Instructions (Addendum)
Leave dressing in place for the next 2 days. Recommend wound check at UC if unable to see surgery clinic. Return to ER if bleeding through dressing.

## 2021-11-07 ENCOUNTER — Other Ambulatory Visit (INDEPENDENT_AMBULATORY_CARE_PROVIDER_SITE_OTHER): Payer: Medicaid Other

## 2021-11-07 ENCOUNTER — Ambulatory Visit (INDEPENDENT_AMBULATORY_CARE_PROVIDER_SITE_OTHER): Payer: Medicaid Other | Admitting: Physician Assistant

## 2021-11-07 ENCOUNTER — Encounter: Payer: Self-pay | Admitting: Physician Assistant

## 2021-11-07 VITALS — BP 110/68 | HR 120 | Ht 64.0 in | Wt 150.5 lb

## 2021-11-07 DIAGNOSIS — Z5900 Homelessness unspecified: Secondary | ICD-10-CM | POA: Diagnosis not present

## 2021-11-07 DIAGNOSIS — K7031 Alcoholic cirrhosis of liver with ascites: Secondary | ICD-10-CM

## 2021-11-07 LAB — CBC WITH DIFFERENTIAL/PLATELET
Basophils Relative: 0 % (ref 0.0–3.0)
Eosinophils Relative: 3 % (ref 0.0–5.0)
HCT: 26.2 % — ABNORMAL LOW (ref 36.0–46.0)
Hemoglobin: 8.8 g/dL — ABNORMAL LOW (ref 12.0–15.0)
Lymphocytes Relative: 36 % (ref 12.0–46.0)
MCHC: 33.6 g/dL (ref 30.0–36.0)
MCV: 89.2 fl (ref 78.0–100.0)
Monocytes Relative: 10 % (ref 3.0–12.0)
Neutrophils Relative %: 51 % (ref 43.0–77.0)
Platelets: 80 10*3/uL — ABNORMAL LOW (ref 150.0–400.0)
RBC: 2.94 Mil/uL — ABNORMAL LOW (ref 3.87–5.11)
RDW: 21.8 % — ABNORMAL HIGH (ref 11.5–15.5)
WBC: 3.9 10*3/uL — ABNORMAL LOW (ref 4.0–10.5)

## 2021-11-07 LAB — PROTIME-INR
INR: 2 ratio — ABNORMAL HIGH (ref 0.8–1.0)
Prothrombin Time: 20.8 s — ABNORMAL HIGH (ref 9.6–13.1)

## 2021-11-07 LAB — COMPREHENSIVE METABOLIC PANEL
ALT: 23 U/L (ref 0–35)
AST: 54 U/L — ABNORMAL HIGH (ref 0–37)
Albumin: 2.9 g/dL — ABNORMAL LOW (ref 3.5–5.2)
Alkaline Phosphatase: 243 U/L — ABNORMAL HIGH (ref 39–117)
BUN: 10 mg/dL (ref 6–23)
CO2: 23 mEq/L (ref 19–32)
Calcium: 9.2 mg/dL (ref 8.4–10.5)
Chloride: 108 mEq/L (ref 96–112)
Creatinine, Ser: 0.7 mg/dL (ref 0.40–1.20)
GFR: 110.12 mL/min (ref >60.00)
Glucose, Bld: 114 mg/dL — ABNORMAL HIGH (ref 70–99)
Potassium: 3.9 mEq/L (ref 3.5–5.1)
Sodium: 137 mEq/L (ref 135–145)
Total Bilirubin: 4.8 mg/dL — ABNORMAL HIGH (ref 0.2–1.2)
Total Protein: 7 g/dL (ref 6.0–8.3)

## 2021-11-07 MED ORDER — FUROSEMIDE 20 MG PO TABS
20.0000 mg | ORAL_TABLET | Freq: Two times a day (BID) | ORAL | 1 refills | Status: DC
Start: 2021-11-07 — End: 2021-12-06

## 2021-11-07 MED ORDER — LACTULOSE 10 GM/15ML PO SOLN
30.0000 g | Freq: Every day | ORAL | 11 refills | Status: DC | PRN
Start: 2021-11-07 — End: 2022-07-24

## 2021-11-07 MED ORDER — SPIRONOLACTONE 100 MG PO TABS: 100.0000 mg | ORAL_TABLET | Freq: Every day | ORAL | 1 refills | Status: DC

## 2021-11-07 NOTE — Progress Notes (Signed)
Chief Complaint: Alcoholic cirrhosis  HPI:    [name] is a 35 year old female with a past medical history of alcoholic cirrhosis, assigned to Dr. Chales Abrahams during first hospitalization, who presents clinic today for follow-up of her alcoholic cirrhosis.    07/12/2021 patient consulted by our service in the hospital.  At that time she was homeless and had recently been admitted for jaundice and lower extremity edema after actively drinking.  She was ultimately diagnosed with decompensated liver disease, combination of cirrhosis and alcoholic hepatitis.  She had a paracentesis at that time 100 cc of fluid and received 5 units PRBCs for hemoglobin nadir of 1.8 which turned to 7.5 at discharge.  She is started on Lactulose and later Rifaximin for AMS.  She was discharged on Furosemide 20 mg daily, Spironolactone 100 mg daily and Lactulose 3 times daily.  Then during next admission she came back to the ED 8/14 to address bleeding that started from the previous side of her paracentesis.  Her hemoglobin was 7.9.  She had transfusion of 1 unit PRBCs after hemoglobin drifted down to 6.2.  At that time discussed she had not consumed any alcohol since her discharge earlier that month.  She received some vitamin K.  That time recommended an EGD but she did not have this done as she left AMA to take care of her daughter.    07/23/2021 CT of abdomen pelvis with contrast with marked limited evaluation for bleed due to single Portis venous phase study obtained, however no anterior abdominal wall hematoma, cirrhosis with portal hypertension, diffusely heterogenous hepatic parenchyma suggesting multiple poorly defined hypodense lesions concerning for underlying malignancy an MRI was recommended with liver protocol.  Small to moderate volume simple free fluid ascites.  Bilateral trace pleural effusions.    Today, the patient presents to clinic, she has not followed with anyone since leaving the hospital in early September.   Her biggest complaint today is that she has run out of her medications about a week ago, her Lactulose, Furosemide and Spironolactone.  Tells me that her legs are swelling to the point where it makes it hard for her to walk around.  Also describes some pain in her legs and asks if she can have some "pain medication".  Tells me that she has not had a drop of alcohol since her first hospitalization in August.  She recently got approved for Medicaid and is trying to file for disability so that she can get housing for her and her daughter as she is currently still living in a shelter.  She does have a new patient visit set up with [facility] in January.    Also continues with some drainage from the area of previous paracentesis which has been a problem since September.  Currently there is a bandage over this but she tells me the gauze is "stuck and if I pull it off it might bleed again".  Apparently has not been changed for 2 weeks.    Denies fever, chills, weight loss, blood in her stool or abdominal pain.  Past Medical History:  Diagnosis Date   Cirrhosis, alcoholic (HCC)    Supratherapeutic INR    Thyroid disease     Past Surgical History:  Procedure Laterality Date   INCISION AND DRAINAGE OF WOUND N/A 07/24/2021   Procedure: Abdominal Wound Exploration With Vessel Ligation;  Surgeon: Manus Rudd, MD;  [facility];  Service: General;  Laterality: N/A;    Current  Outpatient Medications  Medication Sig Dispense Refill   folic acid (FOLVITE) 1 MG tablet Take 1 tablet (1 mg total) by mouth daily. 30 tablet 01   furosemide (LASIX) 20 MG tablet Take 1 tablet (20 mg total) by mouth 2 (two) times daily. Do not take if systolic blood pressure is less than 100 30 tablet 1   lactulose (CEPHULAC) 10 g packet Take 1 packet (10 g total) by mouth 3 (three) times daily. 30 each 1   Multiple Vitamin (MULTIVITAMIN WITH MINERALS) TABS tablet Take 1 tablet by mouth daily. 90  tablet 3   spironolactone (ALDACTONE) 100 MG tablet Take 1 tablet (100 mg total) by mouth daily. Do not take if systolic blood pressure is less than 100 30 tablet 1   thiamine 100 MG tablet Take 1 tablet (100 mg total) by mouth daily. 30 tablet 1   No current facility-administered medications for this visit.    Allergies as of 11/07/2021   (No Known Allergies)    Family History  Family history unknown: Yes    Social History   Socioeconomic History   Marital status: Single    Spouse name: Not on file   Number of children: Not on file   Years of education: Not on file   Highest education level: Not on file  Occupational History   Not on file  Tobacco Use   Smoking status: Never   Smokeless tobacco: Never  Substance and Sexual Activity   Alcohol use: Not Currently   Drug use: Not on file   Sexual activity: Not on file  Other Topics Concern   Not on file  Social History Narrative   Not on file   Social Determinants of Health   Financial Resource Strain: Not on file  Food Insecurity: Not on file  Transportation Needs: Not on file  Physical Activity: Not on file  Stress: Not on file  Social Connections: Not on file  Intimate Partner Violence: Not on file    Review of Systems:    Constitutional: No weight loss, fever or chills Skin: No rash  Cardiovascular: No chest pain Respiratory: No SOB Gastrointestinal: See HPI and otherwise negative Genitourinary: No dysuria  Neurological: No headache, dizziness or syncope Musculoskeletal: No new muscle or joint pain Hematologic: No bleeding Psychiatric: No history of depression or anxiety   Physical Exam:  Vital signs: BP 110/68    Pulse (!) 120    Ht 5\' 4"  (1.626 m)    Wt 150 lb 8 oz (68.3 kg)    BMI 25.83 kg/m    Constitutional:   Pleasant AA female appears to be in NAD, Well developed, Well nourished, alert and cooperative Head:  Normocephalic and atraumatic. Eyes:   PEERL, EOMI. + icterus. Conjunctiva  pink. Ears:  Normal auditory acuity. Neck:  Supple Throat: Oral cavity and pharynx without inflammation, swelling or lesion.  Respiratory: Respirations even and unlabored. Lungs clear to auscultation bilaterally.   No wheezes, crackles, or rhonchi.  Cardiovascular: Normal S1, S2. No MRG. Regular rate and rhythm.  1+ bilateral lower extremity edema to the level of the shin Gastrointestinal:  Soft, mild distention, nontender. No rebound or guarding. Normal bowel sounds. No appreciable masses or hepatomegaly. Rectal:  Not performed.  Msk:  Symmetrical without gross deformities.  Neurologic:  Alert and  oriented x4;  grossly normal neurologically.  Skin:   Dry and intact without significant lesions or rashes. Psychiatric: Demonstrates good judgement and reason without abnormal affect or behaviors.  RELEVANT  LABS AND IMAGING: CBC    Component Value Date/Time   WBC 11.7 (H) 08/07/2021 0406   RBC 2.33 (L) 08/07/2021 0406   HGB 8.4 (L) 08/07/2021 1204   HCT 26.8 (L) 08/07/2021 1204   PLT 102 (L) 08/07/2021 0406   MCV 106.0 (H) 08/07/2021 0406   MCH 33.0 08/07/2021 0406   MCHC 31.2 08/07/2021 0406   RDW 24.7 (H) 08/07/2021 0406   LYMPHSABS 1.2 08/07/2021 0406   MONOABS 1.3 (H) 08/07/2021 0406   EOSABS 0.1 08/07/2021 0406   BASOSABS 0.1 08/07/2021 0406    CMP     Component Value Date/Time   NA 132 (L) 08/07/2021 0406   K 3.8 08/07/2021 0406   CL 103 08/07/2021 0406   CO2 18 (L) 08/07/2021 0406   GLUCOSE 99 08/07/2021 0406   BUN 7 08/07/2021 0406   CREATININE 0.96 08/07/2021 0406   CALCIUM 8.2 (L) 08/07/2021 0406   PROT 7.2 08/07/2021 0406   ALBUMIN 1.8 (L) 08/07/2021 0406   AST 68 (H) 08/07/2021 0406   ALT 39 08/07/2021 0406   ALKPHOS 282 (H) 08/07/2021 0406   BILITOT 11.2 (H) 08/07/2021 0406   GFRNONAA >60 08/07/2021 0406    Assessment: 1.  Alcoholic cirrhosis: With ascites, patient has abstained from alcohol over the past 3 months, some icterus, leg swelling and abdominal  swelling on exam today, no recent labs, currently 9 medications over the past week 2.  Homeless  Plan: 1.  We will refill patient's Spironolactone 50 mg daily and Furosemide 20 mg daily for now.  We will need to recheck electrolytes in 3 to 4 weeks.  Previously she had been doing well on these medicines.  She does have follow-up with the wellness center at the end of January, if possible would like the patient to have labs there so that we can keep an eye on things.  Otherwise we have also placed a repeat order here to see if she can come back in. 2.  Today check CBC, CMP, PT/INR and AFP. 3.  Re-referred the patient to the liver clinic in town now that she has Medicaid and is trying to get her life together it seems like she may follow-up with them and may even be a candidate for liver transplant in the future given that she has abstained from alcohol. 4.  Refilled patient's Lactulose and discussed using this so that she is having at least 2-3 bowel movements a day. 5.  Patient did ask me to fill out disability paperwork today.  Explained this needs to be done by her primary care provider.  She can have this filled out when she goes to the [facility] if they are willing. 6.  Patient would benefit from an EGD in the future.  At this point I do not think it be feasible for her as she is just now piecing things together.  We will try to arrange for this at follow-up appointment which will be scheduled in 2 to 3 months.  Hyacinth Meeker, PA-C [facility] 11/07/2021, 10:31 AM  Cc: No ref. provider found

## 2021-11-07 NOTE — Patient Instructions (Addendum)
Your provider has requested that you go to the basement level for lab work before leaving today. Press "B" on the elevator. The lab is located at the first door on the left as you exit the elevator.   We have sent the following medications to your pharmacy for you to pick up at your convenience: Lactulose 30 g daily as needed. Spironolactone 50 mg daily. Furosemide 20 mg daily.  If you are age 37 or older, your body mass index should be between 23-30. Your Body mass index is 25.83 kg/m. If this is out of the aforementioned range listed, please consider follow up with your Primary Care Provider.  If you are age 80 or younger, your body mass index should be between 19-25. Your Body mass index is 25.83 kg/m. If this is out of the aformentioned range listed, please consider follow up with your Primary Care Provider.   ________________________________________________________  The [facility] providers would like to encourage you to use [facility] to communicate with providers for non-urgent requests or questions.  Due to long hold times on the telephone, sending your provider a message by [facility] may be a faster and more efficient way to get a response.  Please allow 48 business hours for a response.  Please remember that this is for non-urgent requests.  _______________________________________________________

## 2021-11-08 LAB — AFP TUMOR MARKER: AFP-Tumor Marker: 2.8 ng/mL

## 2021-11-12 ENCOUNTER — Telehealth: Payer: Self-pay | Admitting: Physician Assistant

## 2021-11-12 NOTE — Telephone Encounter (Signed)
Received a call from Tami Ribas of [employer] on this patient's behalf.  She said patient had brought in a paper that needed to be signed so that they could provide her a housing voucher and get her out of her homeless situation.  Patient told Ms. Morehead that we refused to sign the paper.  Ms. Maryruth Bun said this paper was the only thing holding up patient from getting her voucher.  She would like to speak with someone about what she can do on behalf of this patient.  Please call and advise.  Ms. Osker Mason office number is:  [phone] Email is [email or web address]  Thank you.

## 2021-11-12 NOTE — Telephone Encounter (Signed)
Attempted to reach patient at her listed number, received an automated prompt stating "the number you requested cannot be dialed". Pt had an alternate number on her DPR [phone]), I left a vm for patient to return call.  Attempted to reach Michelle Little at her office, her vm is full and I am not able to leave a vm at this time. [name] is not on patient's DPR to speak with.

## 2021-11-14 NOTE — Telephone Encounter (Signed)
Called and spoke with patient. Advised that I am not able to speak with Michelle Little since she is not on her DPR. Advised that she would need to reach out to her PCP to have them complete the requested forms for her housing voucher. Pt states that she will relay the information to [name]. Pt verbalized understanding and had no concerns at the end of the call.

## 2021-11-25 NOTE — Progress Notes (Signed)
Agree with the plan RG 

## 2021-12-06 ENCOUNTER — Other Ambulatory Visit: Payer: Self-pay | Admitting: Physician Assistant

## 2021-12-10 NOTE — Congregational Nurse Program (Signed)
°  Dept: [phone]   Congregational Nurse Program Note  Date of Encounter: 12/10/2021  Past Medical History: Past Medical History:  Diagnosis Date   Cirrhosis, alcoholic (HCC)    Supratherapeutic INR    Thyroid disease     Encounter Details:  CNP Questionnaire - 12/10/21 1136       Questionnaire   Do you give verbal consent to treat you today? Yes    Location Patient Served  [facility]    Visit Setting Church or Organization    Patient Status Homeless    Insurance [facility]    Insurance Referral N/A    Medication N/A    Medical Provider Yes    Screening Referrals N/A    Medical Referral N/A    Medical Appointment Made Other    Food Have Food Insecurities    Transportation Need transportation assistance;Referred to transportation service    Housing/Utilities N/A    Interpersonal Safety N/A    Intervention Case Management;Navigate Healthcare System;Counsel;Educate;N/A    ED Visit Averted N/A    Life-Saving Intervention Made N/A             Spoke with mom about PCP. Says will make appt. Says she received information today about [facility] to call for appt.  Joya Gaskins, RN, BSN

## 2021-12-12 NOTE — Congregational Nurse Program (Signed)
°  Dept: [phone]   Congregational Nurse Program Note  Date of Encounter: 12/12/2021  Past Medical History: Past Medical History:  Diagnosis Date   Cirrhosis, alcoholic (HCC)    Supratherapeutic INR    Thyroid disease     Encounter Details:  CNP Questionnaire - 12/12/21 1635       Questionnaire   Do you give verbal consent to treat you today? Yes    Location Patient Served  [facility]    Visit Setting Church or Organization    Patient Status Homeless    Insurance [facility]    Insurance Referral N/A    Medication N/A    Medical Provider Yes    Screening Referrals N/A    Medical Referral N/A    Medical Appointment Made Other    Food Have Food Insecurities    Transportation Need transportation assistance;Referred to transportation service    Housing/Utilities N/A    Interpersonal Safety N/A    Intervention Case Management;Navigate Healthcare System;Counsel;Educate;N/A    ED Visit Averted N/A    Life-Saving Intervention Made N/A             Mom completed Part A for [employer] [place] transportation services. Will complete Part B for pt. then submit for approval. Plan follow-up again next week.   Joya Gaskins, RN, BSN

## 2021-12-17 NOTE — Congregational Nurse Program (Signed)
°  Dept: [phone]   Congregational Nurse Program Note  Date of Encounter: 12/17/2021  Past Medical History: Past Medical History:  Diagnosis Date   Cirrhosis, alcoholic (HCC)    Supratherapeutic INR    Thyroid disease     Encounter Details:  CNP Questionnaire - 12/17/21 1132       Questionnaire   Do you give verbal consent to treat you today? Yes    Location Patient Served  [facility]    Visit Setting Church or Organization    Patient Status Homeless    Insurance [facility]    Insurance Referral N/A    Medication N/A    Medical Provider Yes    Screening Referrals N/A    Medical Referral N/A    Medical Appointment Made Other    Food Have Food Insecurities    Transportation Need transportation assistance;Referred to transportation service    Housing/Utilities No permanent housing    Interpersonal Safety N/A    Intervention Case Management;N/A    ED Visit Averted N/A    Life-Saving Intervention Made N/A             [place] Access application now complete. Will drop off application at [facility] office today.Tami Ribas, SW at [facility] is working with family on permanent housing.   Joya Gaskins, RN, BSN

## 2021-12-23 ENCOUNTER — Ambulatory Visit: Payer: Self-pay | Admitting: Nurse Practitioner

## 2021-12-26 NOTE — Congregational Nurse Program (Signed)
°  Dept: [phone]   Congregational Nurse Program Note  Date of Encounter: 12/26/2021  Past Medical History: Past Medical History:  Diagnosis Date   Cirrhosis, alcoholic (HCC)    Supratherapeutic INR    Thyroid disease     Encounter Details:  CNP Questionnaire - 12/26/21 1510       Questionnaire   Do you give verbal consent to treat you today? Yes    Location Patient Served  [facility]    Visit Setting Church or Organization    Patient Status Homeless    Insurance [facility]    Insurance Referral N/A    Medication N/A    Medical Provider Yes    Screening Referrals N/A    Medical Referral N/A    Medical Appointment Made Other    Food Have Food Insecurities    Transportation Need transportation assistance;Referred to transportation service    Housing/Utilities No permanent housing    Interpersonal Safety N/A    Intervention Case Management;N/A;Counsel;Educate    ED Visit Averted N/A    Life-Saving Intervention Made N/A             Spoke with pt. about referral to liver specialist. Says letter she received indicated they would contact her with an appointment. Encouraged pt. to contact the Dr's office if she doesn't hear from them soon. Stressed importance of follow-up. Verbalized understanding.  Joya Gaskins, RN, BSN

## 2022-01-03 ENCOUNTER — Other Ambulatory Visit: Payer: Self-pay | Admitting: Physician Assistant

## 2022-01-03 NOTE — Telephone Encounter (Signed)
At last Ov patient was advised to be seen in 2-3 months. No follow up visit. Please advise.

## 2022-01-06 ENCOUNTER — Other Ambulatory Visit: Payer: Self-pay | Admitting: Physician Assistant

## 2022-01-06 NOTE — Telephone Encounter (Signed)
Called patient and told patient that the medication was filled but she needed to come in for an office visit in the next 6 months pt stated she would come in in April. Schedule is no out that far so office visit recall placed.

## 2022-01-14 NOTE — Congregational Nurse Program (Signed)
°  Dept: [phone]   Congregational Nurse Program Note  Date of Encounter: 01/14/2022  Past Medical History: Past Medical History:  Diagnosis Date   Cirrhosis, alcoholic (HCC)    Supratherapeutic INR    Thyroid disease     Encounter Details:  CNP Questionnaire - 01/14/22 1043       Questionnaire   Do you give verbal consent to treat you today? Yes    Location Patient Served  [facility]    Visit Setting Church or Organization    Patient Status Homeless    Insurance [facility]    Insurance Referral N/A    Medication N/A    Medical Provider Yes    Screening Referrals N/A    Medical Referral N/A    Medical Appointment Made Other    Food Have Food Insecurities    Transportation Need transportation assistance;Referred to transportation service    Housing/Utilities No permanent housing    Interpersonal Safety N/A    Intervention Case Management;N/A;Counsel;Educate    ED Visit Averted N/A    Life-Saving Intervention Made N/A            Spoke with pt. about previous referral to [employer]. Has not heard from office. Application was dropped off on 12/17/21. Pt. has ph. # and email address of office. Pt. to make ph. call to follow-up application status and need for interview.   Joya Gaskins, RN, BSN

## 2022-01-21 NOTE — Congregational Nurse Program (Signed)
°  Dept: [phone]   Congregational Nurse Program Note  Date of Encounter: 01/21/2022  Past Medical History: Past Medical History:  Diagnosis Date   Cirrhosis, alcoholic (HCC)    Supratherapeutic INR    Thyroid disease     Encounter Details:  CNP Questionnaire - 01/21/22 1000       Questionnaire   Do you give verbal consent to treat you today? Yes    Location Patient Served  [facility]    Visit Setting Church or Organization    Patient Status Homeless    Insurance [facility]    Insurance Referral N/A    Medication N/A    Medical Provider Yes    Screening Referrals N/A    Medical Referral N/A    Medical Appointment Made Other    Food Have Food Insecurities    Transportation Need transportation assistance;Provided transportation assistance    Housing/Utilities No permanent housing    Interpersonal Safety N/A    Intervention Case Management;N/A;Counsel;Educate    ED Visit Averted N/A    Life-Saving Intervention Made N/A             Pt. received transportation approval letter from [facility] and is requesting assistance with understanding next steps. Reviewed letter and additional information regarding need to contact [place] [employer] representative via phone or e-mail to enroll. Pt. to ask about fee system before requesting transportation. Pt. verbalizes understanding and plans to contact representative. Denies other concerns.   Joya Gaskins, RN, BSN

## 2022-05-22 NOTE — Congregational Nurse Program (Signed)
  Dept: [phone]   Congregational Nurse Program Note  Date of Encounter: 05/22/2022  Past Medical History: Past Medical History:  Diagnosis Date   Cirrhosis, alcoholic (HCC)    Supratherapeutic INR    Thyroid disease     Encounter Details:  CNP Questionnaire - 05/22/22 1115       Questionnaire   Do you give verbal consent to treat you today? Yes    Location Patient Served  [facility]    Visit Setting Home    Patient Status --    Insurance Medicaid    Insurance Referral N/A    Medication N/A    Medical Provider Yes    Screening Referrals N/A    Medical Referral N/A;[facility] P[facility]    Medical Appointment Made [facility] P[facility]    Food Have Food Insecurities    Transportation N/A    Housing/Utilities N/A    Interpersonal Safety N/A    Intervention Case Management;N/A;Counsel;Educate    ED Visit Averted N/A    Life-Saving Intervention Made N/A            Home visit to see pt. and family at new address: [address] [place]. Pt. Says is taking her medication and has an appointment to see Victorino Dike on 7/6 for follow-up. Transportation with [employer] [place] was previously approved. Is also familiar with usage of Medicaid transportation.Tami Ribas, SW with [facility], was also present during visit to assist with other needs.   Joya Gaskins, RN, BSN

## 2022-05-25 ENCOUNTER — Other Ambulatory Visit: Payer: Self-pay | Admitting: Physician Assistant

## 2022-06-19 ENCOUNTER — Encounter: Payer: Self-pay | Admitting: Physician Assistant

## 2022-06-21 ENCOUNTER — Other Ambulatory Visit: Payer: Self-pay | Admitting: Physician Assistant

## 2022-06-23 ENCOUNTER — Telehealth: Payer: Self-pay | Admitting: Physician Assistant

## 2022-06-23 MED ORDER — FUROSEMIDE 20 MG PO TABS: 20.0000 mg | ORAL_TABLET | Freq: Two times a day (BID) | ORAL | 0 refills | Status: DC

## 2022-06-23 NOTE — Telephone Encounter (Signed)
Inbound call from patient needing refill for Furosemide medication. Patient has upcoming scheduled for 07/22/22 at 8:30. Patient last OV 11/07/2021. Please refill until then medication can be sent to [facility] pharmacy on [address] [place]. Please give a call to further advise.  Thank you.

## 2022-06-23 NOTE — Telephone Encounter (Signed)
Furosemide refilled as requested. Left Aleen a detailed message that this has been done.

## 2022-06-23 NOTE — Telephone Encounter (Signed)
Please advise Victorino Dike? Thank you.

## 2022-07-22 ENCOUNTER — Ambulatory Visit: Payer: Medicaid Other | Admitting: Physician Assistant

## 2022-07-24 ENCOUNTER — Other Ambulatory Visit (INDEPENDENT_AMBULATORY_CARE_PROVIDER_SITE_OTHER): Payer: Medicaid Other

## 2022-07-24 ENCOUNTER — Encounter: Payer: Self-pay | Admitting: Physician Assistant

## 2022-07-24 ENCOUNTER — Ambulatory Visit (INDEPENDENT_AMBULATORY_CARE_PROVIDER_SITE_OTHER): Payer: Medicaid Other | Admitting: Physician Assistant

## 2022-07-24 VITALS — BP 102/68 | HR 78 | Ht 64.0 in | Wt 165.3 lb

## 2022-07-24 DIAGNOSIS — K7031 Alcoholic cirrhosis of liver with ascites: Secondary | ICD-10-CM

## 2022-07-24 LAB — COMPREHENSIVE METABOLIC PANEL
ALT: 24 U/L (ref 0–35)
AST: 52 U/L — ABNORMAL HIGH (ref 0–37)
Albumin: 3 g/dL — ABNORMAL LOW (ref 3.5–5.2)
Alkaline Phosphatase: 234 U/L — ABNORMAL HIGH (ref 39–117)
BUN: 14 mg/dL (ref 6–23)
CO2: 24 mEq/L (ref 19–32)
Calcium: 8.9 mg/dL (ref 8.4–10.5)
Chloride: 106 mEq/L (ref 96–112)
Creatinine, Ser: 0.83 mg/dL (ref 0.40–1.20)
GFR: 89.31 mL/min (ref >60.00)
Glucose, Bld: 75 mg/dL (ref 70–99)
Potassium: 4.5 mEq/L (ref 3.5–5.1)
Sodium: 136 mEq/L (ref 135–145)
Total Bilirubin: 3 mg/dL — ABNORMAL HIGH (ref 0.2–1.2)
Total Protein: 7.6 g/dL (ref 6.0–8.3)

## 2022-07-24 LAB — PROTIME-INR
INR: 1.5 ratio — ABNORMAL HIGH (ref 0.8–1.0)
Prothrombin Time: 16.2 s — ABNORMAL HIGH (ref 9.6–13.1)

## 2022-07-24 LAB — CBC WITH DIFFERENTIAL/PLATELET
Basophils Relative: 1 % (ref 0.0–3.0)
Eosinophils Relative: 1 % (ref 0.0–5.0)
HCT: 28.8 % — ABNORMAL LOW (ref 36.0–46.0)
Hemoglobin: 9.8 g/dL — ABNORMAL LOW (ref 12.0–15.0)
Lymphocytes Relative: 52 % — ABNORMAL HIGH (ref 12.0–46.0)
MCHC: 34.2 g/dL (ref 30.0–36.0)
MCV: 93.3 fl (ref 78.0–100.0)
Monocytes Relative: 8 % (ref 3.0–12.0)
Neutrophils Relative %: 38 % — ABNORMAL LOW (ref 43.0–77.0)
Platelets: 81 10*3/uL — ABNORMAL LOW (ref 150.0–400.0)
RBC: 3.09 Mil/uL — ABNORMAL LOW (ref 3.87–5.11)
RDW: 17.8 % — ABNORMAL HIGH (ref 11.5–15.5)
WBC: 4.3 10*3/uL (ref 4.0–10.5)

## 2022-07-24 MED ORDER — SPIRONOLACTONE 100 MG PO TABS: 100.0000 mg | ORAL_TABLET | Freq: Every day | ORAL | 5 refills | Status: DC

## 2022-07-24 MED ORDER — FUROSEMIDE 20 MG PO TABS: 20.0000 mg | ORAL_TABLET | Freq: Two times a day (BID) | ORAL | 0 refills | Status: DC

## 2022-07-24 MED ORDER — LACTULOSE 10 GM/15ML PO SOLN: 30.0000 g | Freq: Every day | ORAL | 11 refills | Status: AC | PRN

## 2022-07-24 NOTE — Progress Notes (Signed)
Chief Complaint: Follow-up alcoholic cirrhosis  HPI:    Michelle Little is a 36 year old female with a past medical history as listed below including alcoholic cirrhosis with ascites, known to Dr. Lyndel Safe, who returns to clinic today for follow-up of her alcoholic cirrhosis.    07/23/2021 CT of abdomen pelvis with contrast with marked limited evaluation for bleed due to single Portis venous phase study obtained, however no anterior abdominal wall hematoma, cirrhosis with portal hypertension, diffusely heterogenous hepatic parenchyma suggesting multiple poorly defined hypodense lesions concerning for underlying malignancy an MRI was recommended with liver protocol. Small to moderate volume simple free fluid ascites. Bilateral trace pleural effusions.     11/07/2021 patient seen in clinic and at that time her biggest complaint was that she had run out of medications including Lactulose, Furosemide and Spironolactone.  Her legs were swelling and it was hard for her to walk around.  At that time had abstained from alcohol over the past 3 months and was having bilateral lower extremity swelling and icterus.  At that time refilled Spironolactone 50 mg daily and Furosemide 20 mg daily with plans to recheck electrolytes and adjust as needed.  Recheck CBC, CMP, PT/INR and AFP.  We referred the patient to the liver clinic in town.  Refilled Lactulose.  Discussed that she would benefit from an EGD at the future.    11/07/2021 CBC with a hemoglobin of 8.8, platelets 80, total bilirubin 4.8, alk phos 243, AST 54, PT/INR 2.0/20.8, AFP normal at 2.8.    Today, the patient tells me that she was at the shelter for a long time and was being followed by a nurse there, and in May she moved to her own home and the nurse has come to check on her there at least once as well.  In general she feels like she is doing pretty good.  She does not drink alcohol at all over the past year.  She has been staying on her Furosemide 20 mg twice a  day and Spironolactone 100 mg daily as well as Lactulose 45 mL once daily.  Tells me she has 3-4 bowel movements a day, does have some trouble with bilateral lower extremity swelling which can be worse depending on the day.  Tells me though that she does sometimes get dizzy and " I do not quite feel like myself".    Patient has an autistic daughter.    Denies fever, chills, weight loss, blood in her stool or symptoms that awaken her from sleep.  Past Medical History:  Diagnosis Date   Cirrhosis, alcoholic ([place])    Supratherapeutic INR    Thyroid disease     Past Surgical History:  Procedure Laterality Date   INCISION AND DRAINAGE OF WOUND N/A 07/24/2021   Procedure: Abdominal Wound Exploration With Vessel Ligation;  Surgeon: Donnie Mesa, MD;  Location: [facility];  Service: General;  Laterality: N/A;    Current Outpatient Medications  Medication Sig Dispense Refill   folic acid (FOLVITE) 1 MG tablet Take 1 tablet (1 mg total) by mouth daily. 30 tablet 01   furosemide (LASIX) 20 MG tablet Take 1 tablet (20 mg total) by mouth 2 (two) times daily. Do not take if systolic blood pressure is lower than 100. 30 tablet 0   lactulose (CHRONULAC) 10 GM/15ML solution Take 45 mLs (30 g total) by mouth daily as needed for mild constipation. 900 mL 11   Multiple Vitamin (MULTIVITAMIN WITH MINERALS) TABS tablet Take 1 tablet by mouth  daily. 90 tablet 3   spironolactone (ALDACTONE) 100 MG tablet TAKE 1 TABLET(100 MG) BY MOUTH DAILY. DO NOT TAKE IF SYSTOLIC BLOOD PRESSURE IS. LESS THAN 100 30 tablet 5   thiamine 100 MG tablet Take 1 tablet (100 mg total) by mouth daily. 30 tablet 1   No current facility-administered medications for this visit.    Allergies as of 07/24/2022   (No Known Allergies)    Family History  Family history unknown: Yes    Social History   Socioeconomic History   Marital status: Single    Spouse name: Not on file   Number of children: 3   Years of education: Not on file    Highest education level: Not on file  Occupational History   Not on file  Tobacco Use   Smoking status: Every Day    Types: Cigarettes   Smokeless tobacco: Never  Vaping Use   Vaping Use: Never used  Substance and Sexual Activity   Alcohol use: Not Currently   Drug use: Not on file   Sexual activity: Not on file  Other Topics Concern   Not on file  Social History Narrative   Not on file   Social Determinants of Health   Financial Resource Strain: Not on file  Food Insecurity: Not on file  Transportation Needs: Not on file  Physical Activity: Not on file  Stress: Not on file  Social Connections: Not on file  Intimate Partner Violence: Not on file    Review of Systems:    Constitutional: No weight loss, fever or chills Cardiovascular: No chest pain  Respiratory: No SOB  Gastrointestinal: See HPI and otherwise negative   Physical Exam:  Vital signs: BP 102/68   Pulse 78   Ht 5' 4"  (1.626 m)   Wt 165 lb 4.8 oz (75 kg)   SpO2 98%   BMI 28.37 kg/m    Constitutional:   Pleasant AA female appears to be in NAD, Well developed, Well nourished, alert and cooperative Respiratory: Respirations even and unlabored. Lungs clear to auscultation bilaterally.   No wheezes, crackles, or rhonchi.  Cardiovascular: Normal S1, S2. No MRG. Regular rate and rhythm. + 1+ b/l LE edema to the level of the knee Gastrointestinal:  Soft, nondistended, nontender. No rebound or guarding. Normal bowel sounds. No appreciable masses or hepatomegaly. Rectal:  Not performed.  Psychiatric: Oriented to person, place and time. Demonstrates good judgement and reason without abnormal affect or behaviors.  MOST RECENT  LABS AND IMAGING: CBC    Component Value Date/Time   WBC 3.9 (L) 11/07/2021 1113   RBC 2.94 (L) 11/07/2021 1113   HGB 8.8 Repeated and verified X2. (L) 11/07/2021 1113   HCT 26.2 Repeated and verified X2. (L) 11/07/2021 1113   PLT 80.0 (L) 11/07/2021 1113   MCV 89.2 11/07/2021 1113    MCH 33.0 08/07/2021 0406   MCHC 33.6 11/07/2021 1113   RDW 21.8 (H) 11/07/2021 1113   LYMPHSABS 1.2 08/07/2021 0406   MONOABS 1.3 (H) 08/07/2021 0406   EOSABS 0.1 08/07/2021 0406   BASOSABS 0.1 08/07/2021 0406    CMP     Component Value Date/Time   NA 137 11/07/2021 1113   K 3.9 11/07/2021 1113   CL 108 11/07/2021 1113   CO2 23 11/07/2021 1113   GLUCOSE 114 (H) 11/07/2021 1113   BUN 10 11/07/2021 1113   CREATININE 0.70 11/07/2021 1113   CALCIUM 9.2 11/07/2021 1113   PROT 7.0 11/07/2021 1113   ALBUMIN  2.9 (L) 11/07/2021 1113   AST 54 (H) 11/07/2021 1113   ALT 23 11/07/2021 1113   ALKPHOS 243 (H) 11/07/2021 1113   BILITOT 4.8 (H) 11/07/2021 1113   GFRNONAA >60 08/07/2021 0406    Assessment: 1.  Alcoholic cirrhosis: With ascites and lower extremity edema, patient has been abstinent from alcohol for the past year per her, stable currently  Plan: 1.  Ordered repeat right upper quadrant ultrasound for [place] screening 2.  Ordered labs including CBC, CMP, PT/INR 3.  We re-referred the patient to the liver clinic in town. 4.  Scheduled the patient for an EGD with Dr. Lyndel Safe in the [facility] for variceal screening.  Did provide the patient a detailed list of risks for the procedure and she agrees to proceed. Patient is appropriate for endoscopic procedure(s) in the ambulatory ([facility] [place]) setting.  5.  Refilled patient's medications including Spironolactone 100 mg daily, Furosemide 20 mg twice daily and Lactulose 45 mL daily 6.  Patient to follow in clinic per recommendations from Dr. Lyndel Safe after time of EGD.  Ellouise Newer, PA-C [facility] 07/24/2022, 8:45 AM  Cc: No ref. provider found

## 2022-07-24 NOTE — Patient Instructions (Signed)
Your provider has requested that you go to the basement level for lab work before leaving today. Press "B" on the elevator. The lab is located at the first door on the left as you exit the elevator.  You have been scheduled for an abdominal ultrasound at [facility] (1st floor of hospital) on Wednesday 07/30/22 at 10 am. Please arrive 15 minutes prior to your appointment for registration. Make certain not to have anything to eat or drink 6 hours prior to your appointment. Should you need to reschedule your appointment, please contact radiology at [phone]. This test typically takes about 30 minutes to perform.  You have been scheduled for an endoscopy. Please follow written instructions given to you at your visit today. If you use inhalers (even only as needed), please bring them with you on the day of your procedure.  _______________________________________________________  If you are age 24 or older, your body mass index should be between 23-30. Your Body mass index is 28.37 kg/m. If this is out of the aforementioned range listed, please consider follow up with your Primary Care Provider.  If you are age 42 or younger, your body mass index should be between 19-25. Your Body mass index is 28.37 kg/m. If this is out of the aformentioned range listed, please consider follow up with your Primary Care Provider.   ________________________________________________________  The [facility] providers would like to encourage you to use [facility] to communicate with providers for non-urgent requests or questions.  Due to long hold times on the telephone, sending your provider a message by [facility] may be a faster and more efficient way to get a response.  Please allow 48 business hours for a response.  Please remember that this is for non-urgent requests.  _______________________________________________________

## 2022-07-26 NOTE — Progress Notes (Signed)
Agree with assessment/plan.  Raj Srihitha Tagliaferri, MD [facility] [phone]  

## 2022-07-30 ENCOUNTER — Ambulatory Visit (HOSPITAL_COMMUNITY): Admission: RE | Admit: 2022-07-30 | Payer: Medicaid Other | Source: Ambulatory Visit

## 2022-08-04 ENCOUNTER — Other Ambulatory Visit: Payer: Self-pay

## 2022-08-04 ENCOUNTER — Telehealth: Payer: Self-pay | Admitting: Gastroenterology

## 2022-08-04 ENCOUNTER — Ambulatory Visit (HOSPITAL_COMMUNITY)
Admission: RE | Admit: 2022-08-04 | Discharge: 2022-08-04 | Disposition: A | Discharge status: Home / Self Care | Payer: Medicaid Other | Source: Ambulatory Visit | Attending: Physician Assistant | Admitting: Physician Assistant

## 2022-08-04 DIAGNOSIS — K769 Liver disease, unspecified: Secondary | ICD-10-CM

## 2022-08-04 DIAGNOSIS — K7031 Alcoholic cirrhosis of liver with ascites: Secondary | ICD-10-CM | POA: Diagnosis present

## 2022-08-04 NOTE — Telephone Encounter (Signed)
Returned call to patient. See 9/11 [place] results for details.

## 2022-08-04 NOTE — Telephone Encounter (Signed)
Patient called, Returning your call regarding CT scan result. Please call to advise.

## 2022-08-29 ENCOUNTER — Ambulatory Visit (HOSPITAL_COMMUNITY): Admission: RE | Admit: 2022-08-29 | Payer: Medicaid Other | Source: Ambulatory Visit

## 2022-09-05 ENCOUNTER — Other Ambulatory Visit: Payer: Self-pay

## 2022-09-05 MED ORDER — FUROSEMIDE 20 MG PO TABS: 20.0000 mg | ORAL_TABLET | Freq: Two times a day (BID) | ORAL | 0 refills | Status: DC

## 2022-09-05 NOTE — Telephone Encounter (Signed)
Furosemide refilled, verbal okay given by Ellouise Newer PA-C.

## 2022-09-08 ENCOUNTER — Encounter: Payer: Medicaid Other | Admitting: Gastroenterology

## 2022-09-25 ENCOUNTER — Other Ambulatory Visit: Payer: Self-pay | Admitting: Physician Assistant

## 2022-09-25 NOTE — Telephone Encounter (Signed)
Appointment scheduled with Dr. Lyndel Safe for 11/14/22 @ 9:30 am with enough medication sent in until then.

## 2022-09-25 NOTE — Telephone Encounter (Signed)
Michelle Little, refill request sent for furosemide. I will refill the medication for # 30 but At the last OV pt was scheduled for an EGD and was told when triaged by [facility] to have an MRI. Pt canceled the EGD, hasn't gone for the MRI and doesn't have a follow up visit with you or Dr. Lyndel Safe. Please advise.

## 2022-11-14 ENCOUNTER — Ambulatory Visit: Payer: Medicaid Other | Admitting: Gastroenterology

## 2022-12-05 IMAGING — CT CT HEAD W/O CM
5 series · 18 of 47 positions shown, 19 images · non-contrast
Comparison: None.

CLINICAL DATA: Altered mental status

EXAM:
CT HEAD WITHOUT CONTRAST
TECHNIQUE: Contiguous axial images were obtained from the base of the skull
through the vertex without intravenous contrast.

[Series 3: head without · axial · non-contrast · 0.41mm/px · z∈[+1092,+1177]mm · 4 of 29 slices shown, 5 images]
[im 6/29  brain]
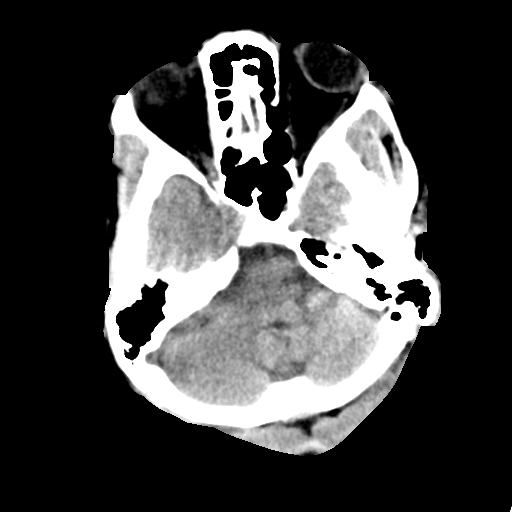
[im 6/29  bone]
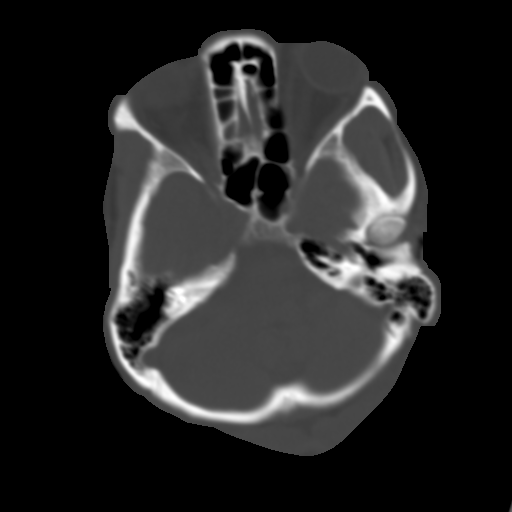
[im 12/29  brain]
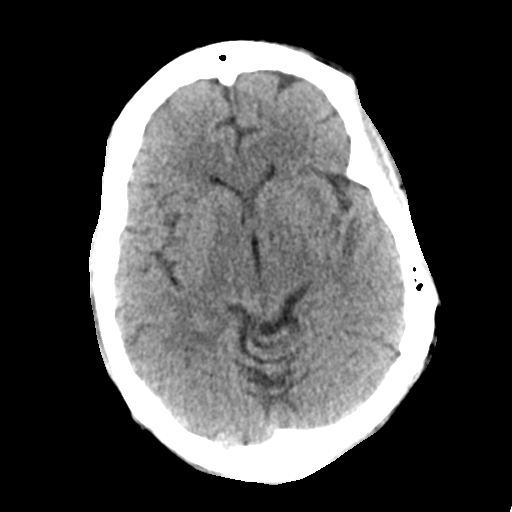
[im 17/29  brain]
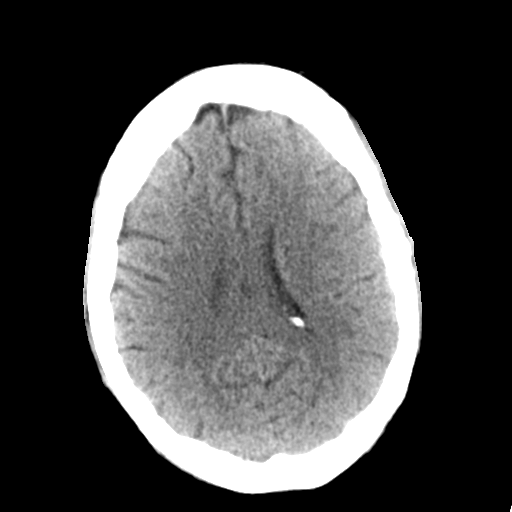
[im 23/29  brain]
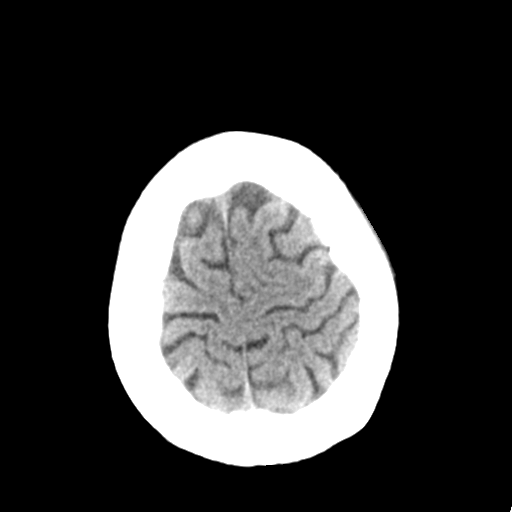

[Series 4: head without ax · axial · non-contrast · 0.41mm/px · z∈[+1081,+1174]mm · 5 of 31 slices shown]
[im 6/31  brain]
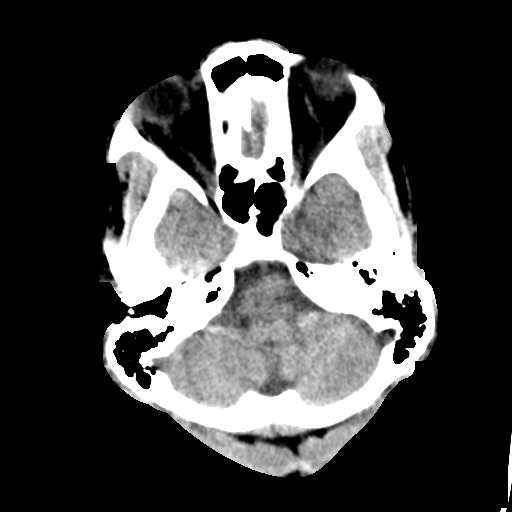
[im 11/31  brain]
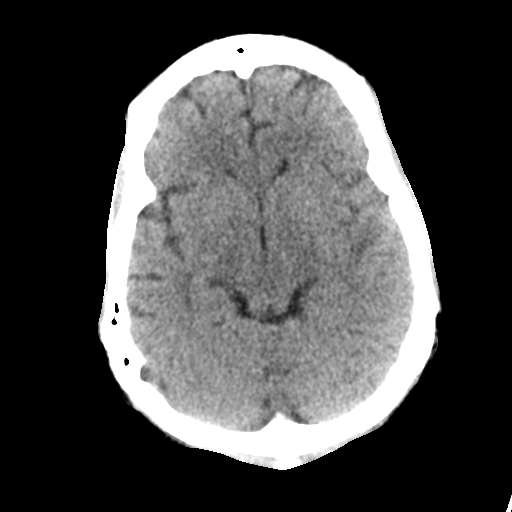
[im 16/31  brain]
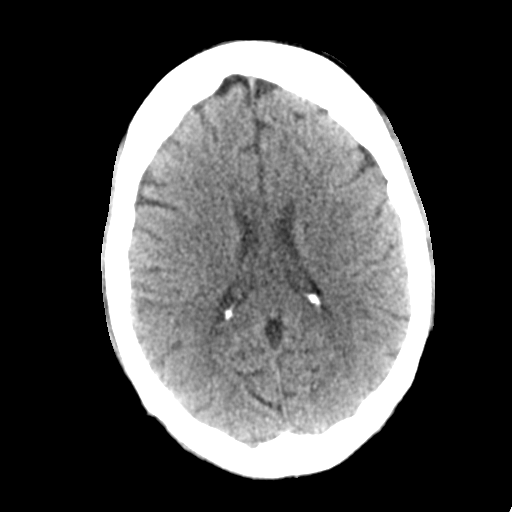
[im 21/31  brain]
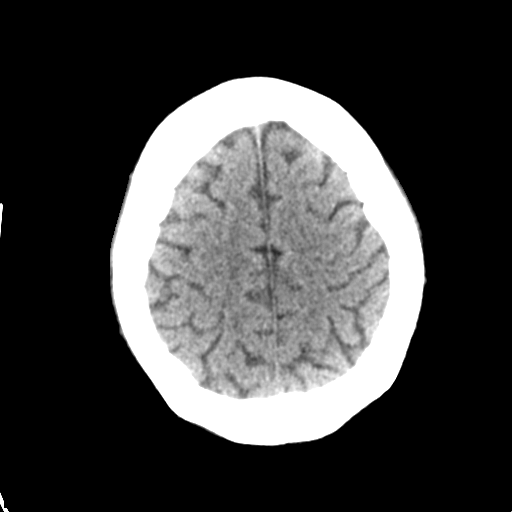
[im 26/31  brain]
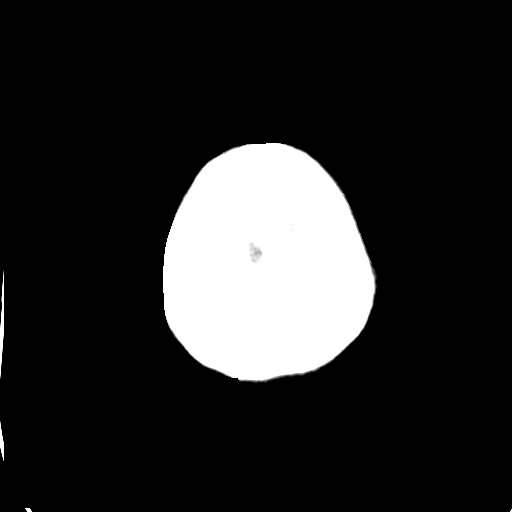

[Series 5: ax head bone · axial · 0.41mm/px · z∈[+1068,+1103]mm · 3 of 76 slices shown]
[im 5/76  bone]
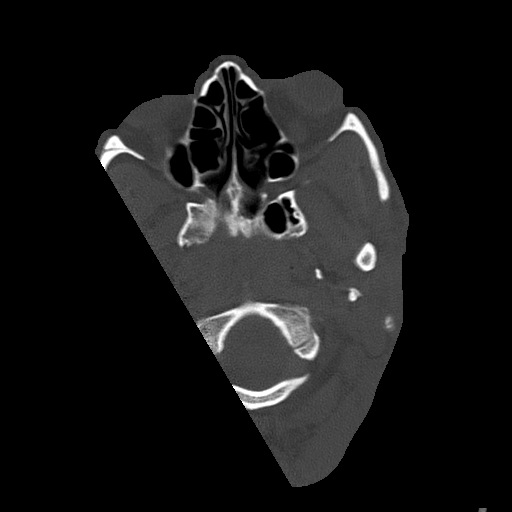
[im 15/76  bone]
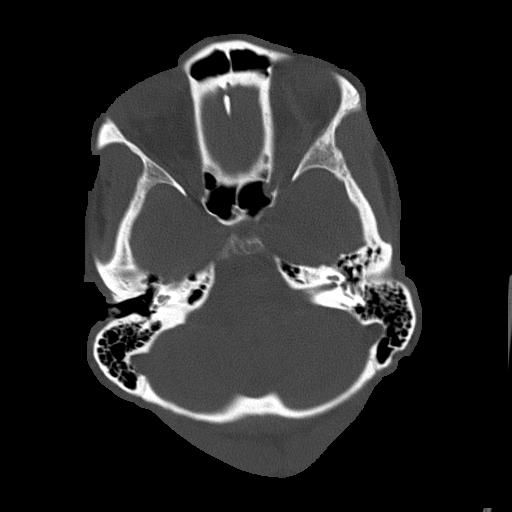
[im 24/76  bone]
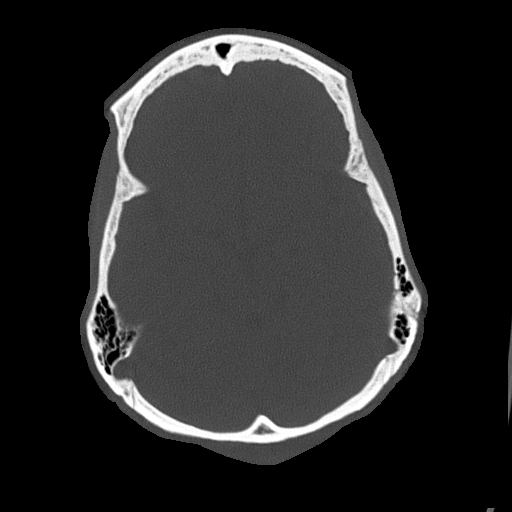

[Series 6: head without cor · coronal · non-contrast · 0.31mm/px · 3 of 61 slices shown]
[im 21/61  brain]
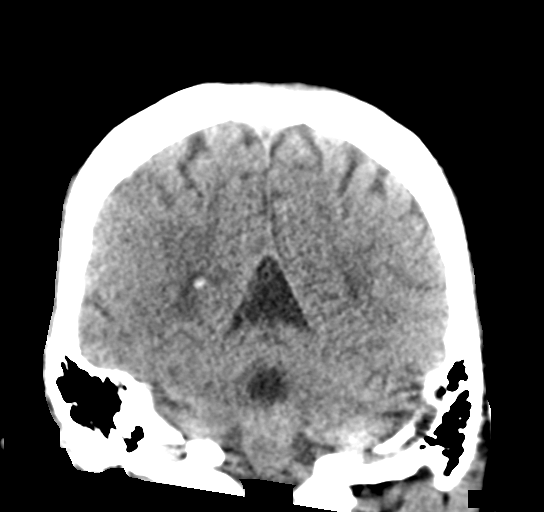
[im 27/61  brain]
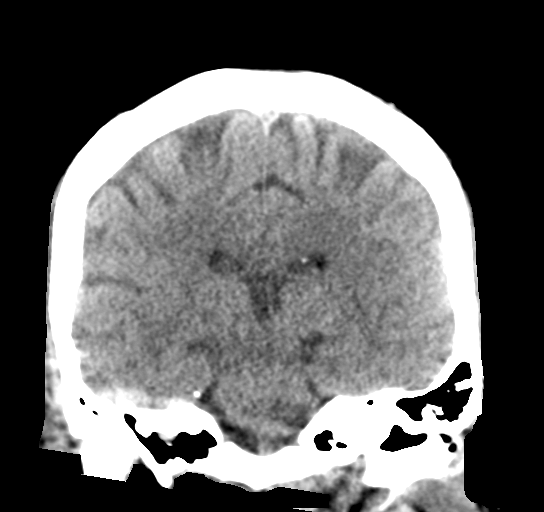
[im 34/61  brain]
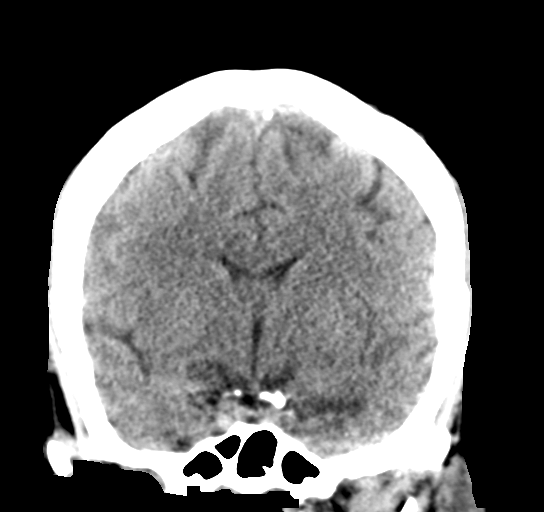

[Series 7: head without sag · sagittal · non-contrast · 0.30mm/px · 3 of 52 slices shown]
[im 20/52  brain]
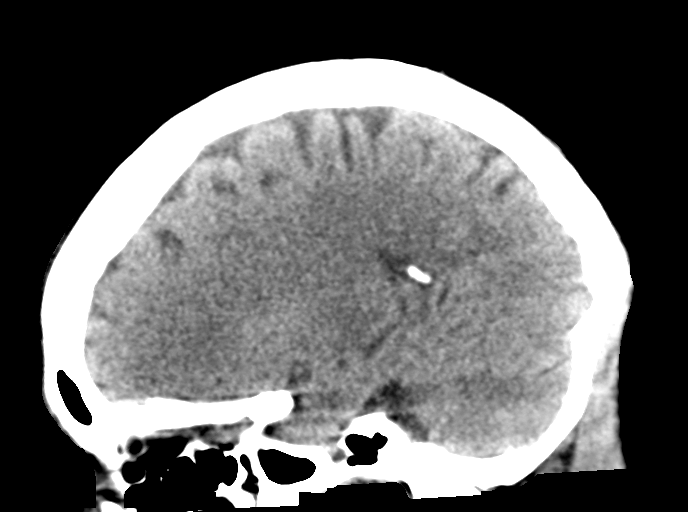
[im 26/52  brain]
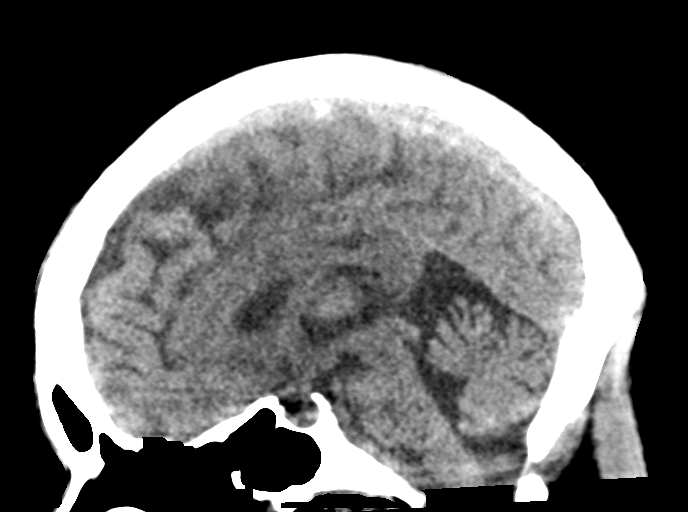
[im 32/52  brain]
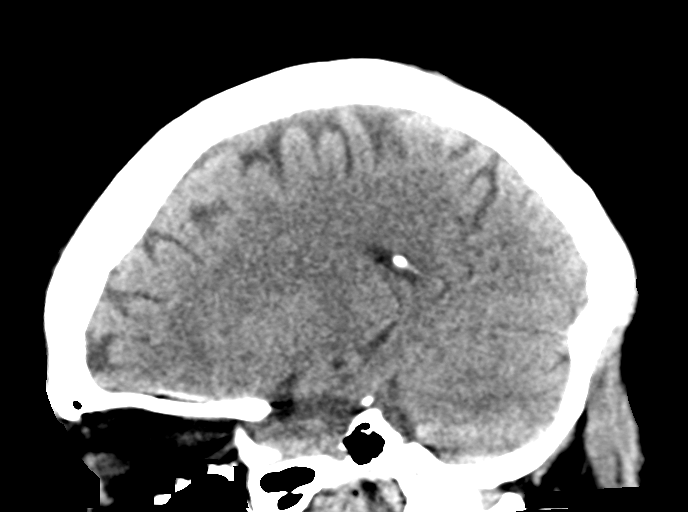

[18 of 47 positions shown; findings below may reference images not displayed]

FINDINGS: Brain: No evidence of acute infarction, hemorrhage, hydrocephalus,
extra-axial collection or mass lesion/mass effect.

Vascular: No hyperdense vessel or unexpected calcification.

Skull: Normal. Negative for fracture or focal lesion.

Sinuses/Orbits: No acute finding.

Other: None.
IMPRESSION: Normal head CT without contrast for age

## 2022-12-06 IMAGING — US US ABDOMEN LIMITED
1 series · 14 of 25 positions shown · non-contrast
Comparison: None.

CLINICAL DATA: Cirrhosis

EXAM:
ULTRASOUND ABDOMEN LIMITED RIGHT UPPER QUADRANT

[Series 1: us abdomen limited ruq (liver/gb) · 14 of 55 slices shown]
[im 1/55]
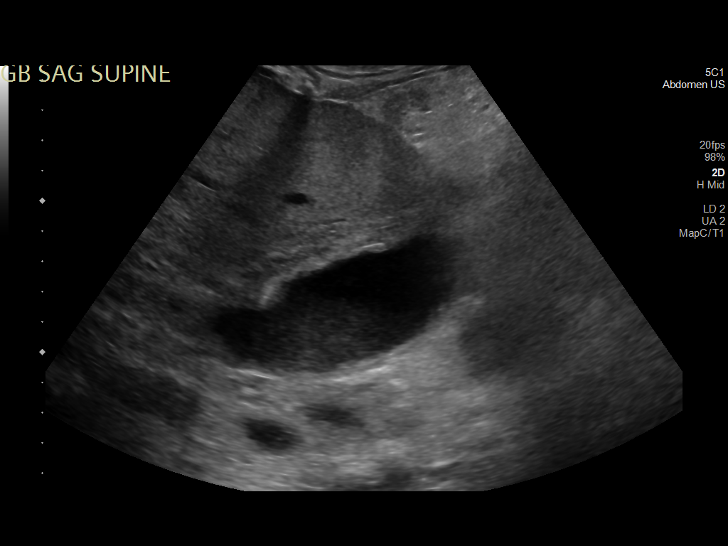
[im 5/55]
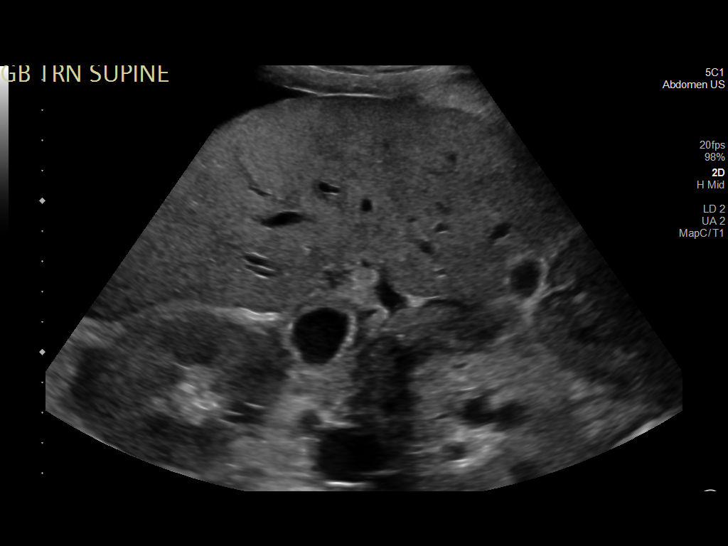
[im 10/55]
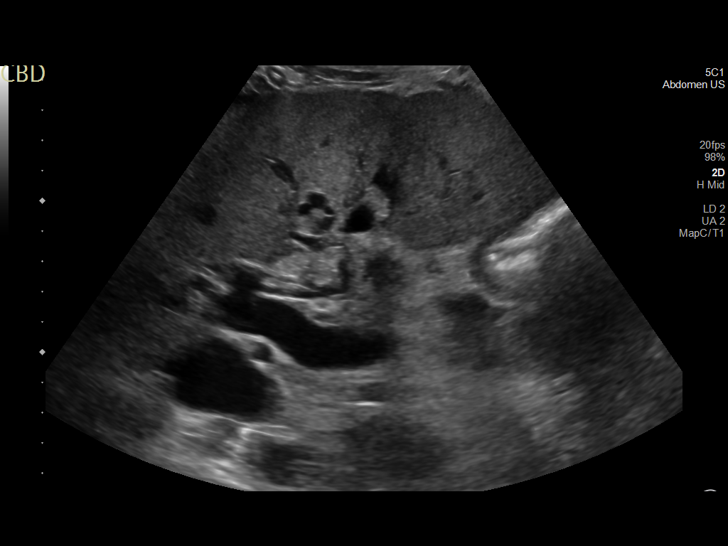
[im 14/55]
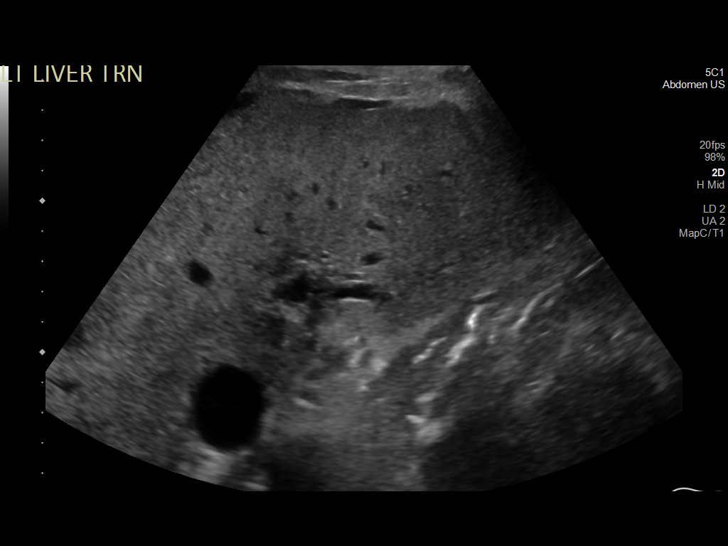
[im 19/55]
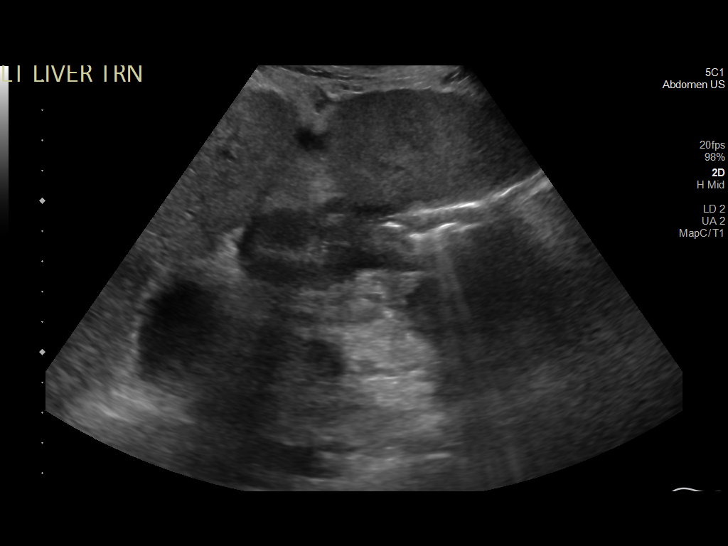
[im 21/55]
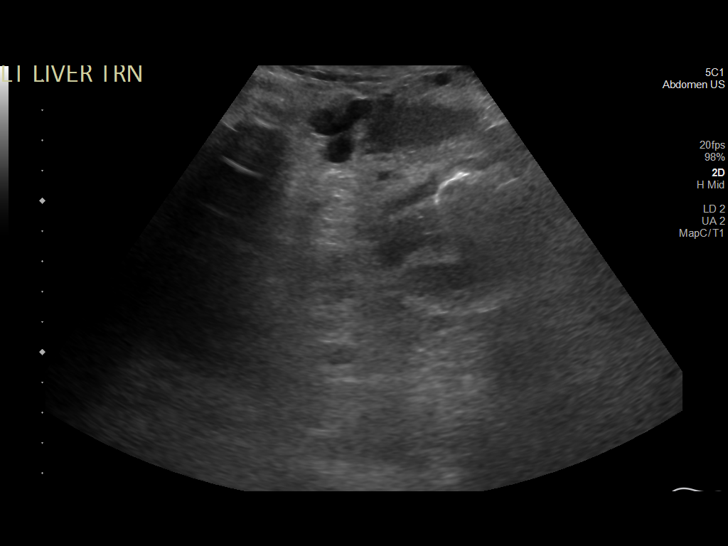
[im 25/55]
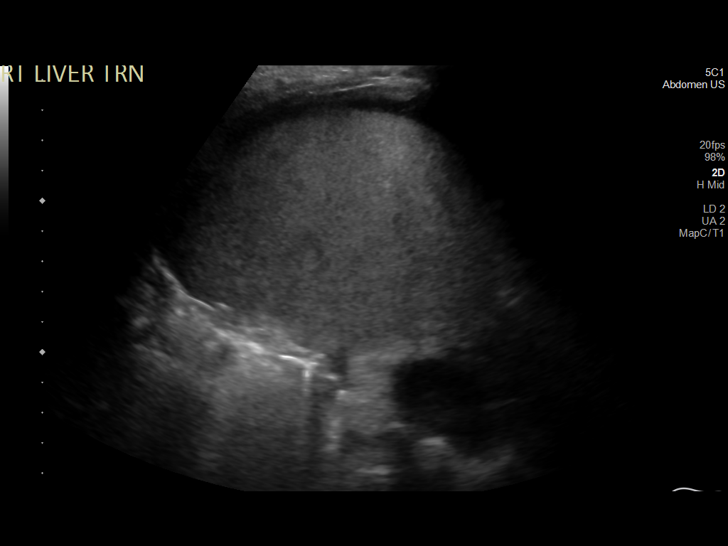
[im 30/55]
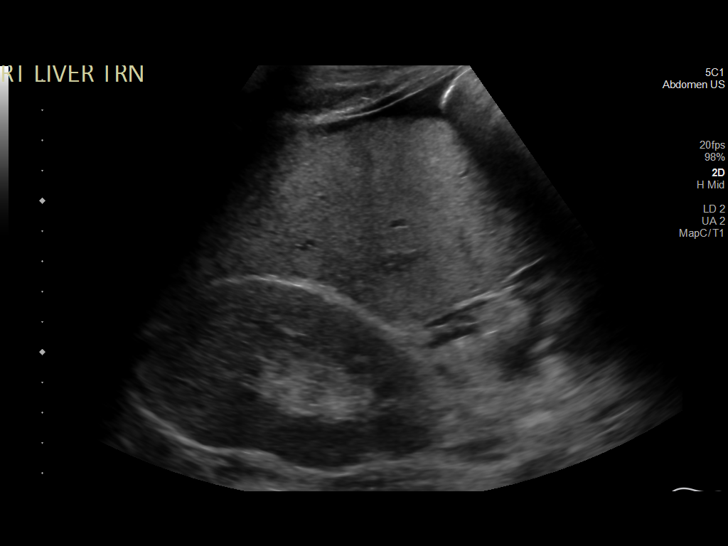
[im 34/55]
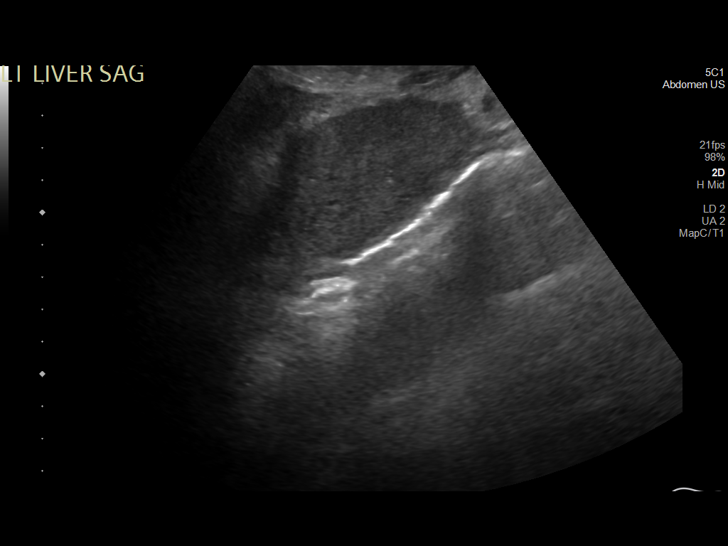
[im 37/55]
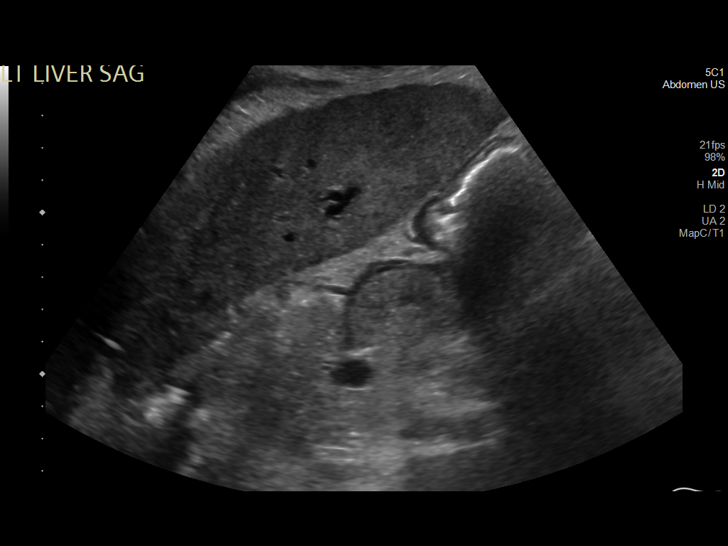
[im 41/55]
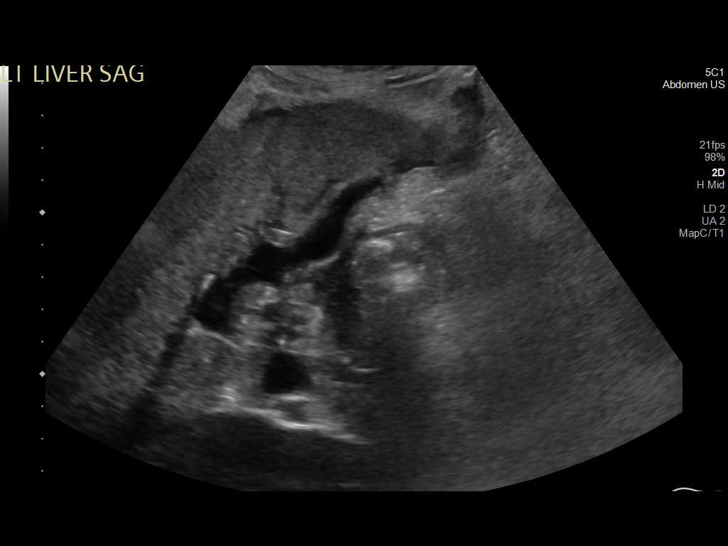
[im 46/55]
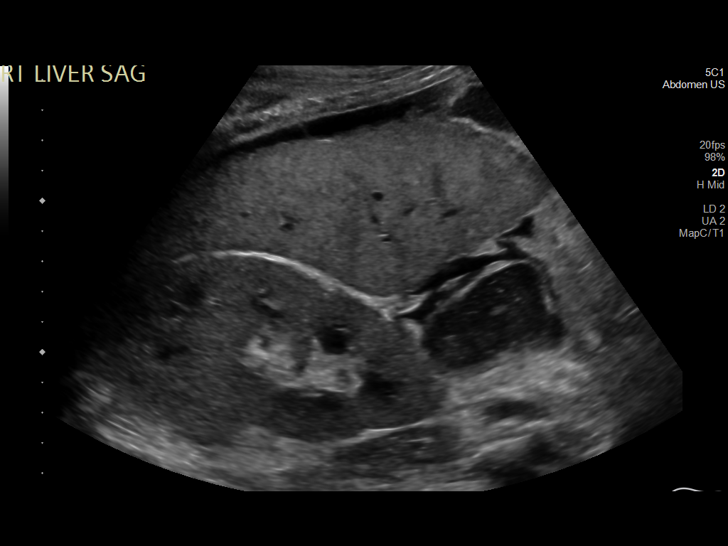
[im 50/55]
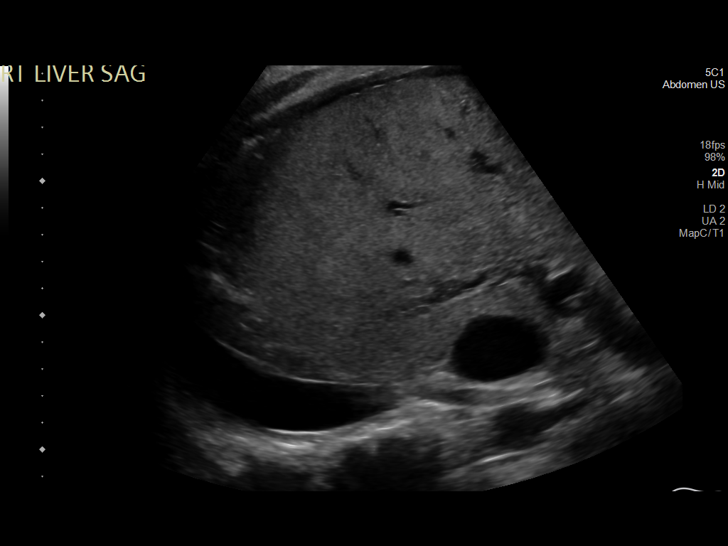
[im 55/55]
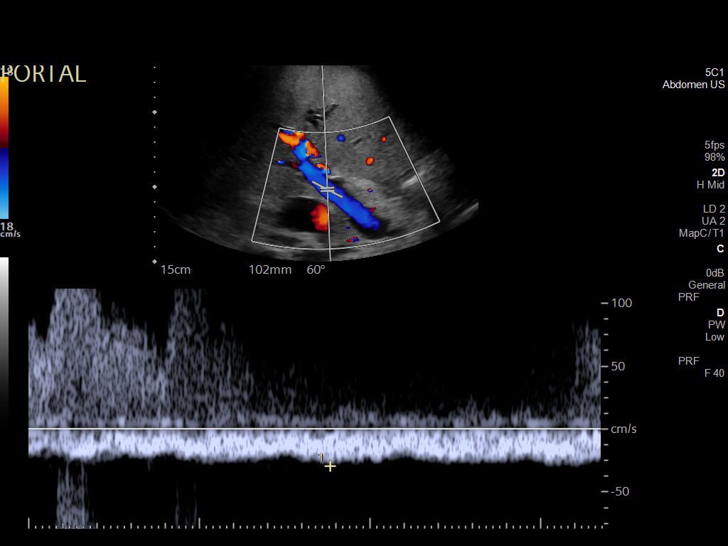

[14 of 25 positions shown; findings below may reference images not displayed]

FINDINGS: Gallbladder:

No gallstones or wall thickening visualized. Sludge is present. No
sonographic Murphy sign noted by sonographer.

Common bile duct:

Diameter: 2.5 mm, normal

Liver:

No focal lesion identified. Increased parenchymal echogenicity.
Coarsening of echotexture. Nodular surface contour. Portal vein is
patent on color Doppler imaging with reversal of normal direction of
blood flow towards the liver.

Other: Right pleural effusion.  Ascites.
IMPRESSION: Cirrhosis with evidence of portal hypertension. There is reversal of
flow in the portal vein.

Gallbladder sludge.

Ascites and right pleural effusion.

## 2024-02-28 ENCOUNTER — Encounter (HOSPITAL_COMMUNITY): Payer: Self-pay

## 2024-02-28 ENCOUNTER — Ambulatory Visit (HOSPITAL_COMMUNITY)
Admission: EM | Admit: 2024-02-28 | Discharge: 2024-02-28 | Disposition: A | Discharge status: Home / Self Care | Attending: Emergency Medicine | Admitting: Emergency Medicine

## 2024-02-28 DIAGNOSIS — J302 Other seasonal allergic rhinitis: Secondary | ICD-10-CM | POA: Diagnosis not present

## 2024-02-28 DIAGNOSIS — Z760 Encounter for issue of repeat prescription: Secondary | ICD-10-CM | POA: Diagnosis not present

## 2024-02-28 DIAGNOSIS — K769 Liver disease, unspecified: Secondary | ICD-10-CM

## 2024-02-28 MED ORDER — LACTULOSE 10 GM/15ML PO SOLN: 30.0000 g | Freq: Every day | ORAL | 0 refills | Status: DC | PRN

## 2024-02-28 MED ORDER — FUROSEMIDE 20 MG PO TABS: 20.0000 mg | ORAL_TABLET | Freq: Every day | ORAL | 0 refills | Status: DC

## 2024-02-28 MED ORDER — CETIRIZINE HCL 10 MG PO TABS: 10.0000 mg | ORAL_TABLET | Freq: Every day | ORAL | 2 refills | Status: DC

## 2024-02-28 MED ORDER — OLOPATADINE HCL 0.1 % OP SOLN: 1.0000 [drp] | Freq: Two times a day (BID) | OPHTHALMIC | 0 refills | Status: DC

## 2024-02-28 NOTE — ED Provider Notes (Signed)
 [facility]    CSN: [id number] Arrival date & time: 02/28/24  1058     History   Chief Complaint Chief Complaint  Patient presents with   Medication Refill    HPI Michelle Little is a 38 y.o. female.  Here for medication refills History of alcoholic cirrhosis. Follows with PCP and gastro. Requesting refill of lasix and lactulose. Has PCP appointment in 3 weeks on 03/18/2024 Today feeling good. No abdominal pain, nausea, vomiting, diarrhea, constipation, confusion, weakness, dizziness  Also her daughter has seasonal allergies and patient would like something to prevent herself from getting allergies   Past Medical History:  Diagnosis Date   Cirrhosis, alcoholic ([facility])    Supratherapeutic INR    Thyroid disease     Patient Active Problem List   Diagnosis Date Noted   Wound dehiscence    Acute blood loss anemia 07/11/2021   Cirrhosis ([facility])    Acute on chronic anemia    Encephalopathy, hepatic ([facility])    Acute renal failure ([facility])    Other ascites    Acute encephalopathy 06/17/2021    Past Surgical History:  Procedure Laterality Date   INCISION AND DRAINAGE OF WOUND N/A 07/24/2021   Procedure: Abdominal Wound Exploration With Vessel Ligation;  Surgeon: Manus Rudd, MD;  Location: [facility] OR;  Service: General;  Laterality: N/A;    OB History   No obstetric history on file.      Home Medications    Prior to Admission medications   Medication Sig Start Date End Date Taking? Authorizing Provider  cetirizine (ZYRTEC ALLERGY) 10 MG tablet Take 1 tablet (10 mg total) by mouth daily. 02/28/24  Yes Brianny Soulliere, Lurena Joiner, PA-C  furosemide (LASIX) 20 MG tablet Take 1 tablet (20 mg total) by mouth daily. 02/28/24  Yes Kalya Troeger, Lurena Joiner, PA-C  lactulose (CHRONULAC) 10 GM/15ML solution Take 45 mLs (30 g total) by mouth daily as needed for mild constipation. 02/28/24  Yes Asley Baskerville, Lurena Joiner, PA-C  olopatadine (PATANOL) 0.1 % ophthalmic solution Place 1 drop into both eyes 2 (two) times  daily. 02/28/24  Yes Noel Rodier, Lurena Joiner, PA-C  folic acid (FOLVITE) 1 MG tablet Take 1 tablet (1 mg total) by mouth daily. Patient not taking: Reported on 07/24/2022 06/30/21   Azucena Fallen, MD  gabapentin (NEURONTIN) 100 MG capsule Take 100 mg by mouth 3 (three) times daily. 05/03/22   [provider]  Multiple Vitamin (MULTIVITAMIN WITH MINERALS) TABS tablet Take 1 tablet by mouth daily. Patient not taking: Reported on 07/24/2022 06/30/21   Azucena Fallen, MD  spironolactone (ALDACTONE) 100 MG tablet Take 1 tablet (100 mg total) by mouth daily. 07/24/22   Unk Lightning, PA  thiamine 100 MG tablet Take 1 tablet (100 mg total) by mouth daily. Patient not taking: Reported on 07/24/2022 06/30/21   Azucena Fallen, MD    Family History Family History  Family history unknown: Yes    Social History Social History   Tobacco Use   Smoking status: Every Day    Types: Cigarettes   Smokeless tobacco: Never  Vaping Use   Vaping status: Never Used  Substance Use Topics   Alcohol use: Not Currently     Allergies   Patient has no known allergies.   Review of Systems Review of Systems Per HPI  Physical Exam Triage Vital Signs ED Triage Vitals [02/28/24 1218]  Encounter Vitals Group     BP (!) 156/68     Systolic BP Percentile  Diastolic BP Percentile      Pulse Rate 97     Resp 18     Temp 98.6 F (37 C)     Temp Source Oral     SpO2 96 %     Weight      Height      Head Circumference      Peak Flow      Pain Score      Pain Loc      Pain Education      Exclude from Growth Chart    No data found.  Updated Vital Signs BP (!) 156/68 (BP Location: Left Arm)   Pulse 97   Temp 98.6 F (37 C) (Oral)   Resp 18   LMP 02/28/2024   SpO2 96%    BP recheck 120/63 Left arm, sitting, taken by automatic cuff  Physical Exam Vitals and nursing note reviewed.  Constitutional:      General: She is not in acute distress.    Appearance: Normal  appearance. She is not ill-appearing or diaphoretic.  HENT:     Right Ear: Tympanic membrane and ear canal normal.     Left Ear: Tympanic membrane and ear canal normal.     Nose: No rhinorrhea.     Mouth/Throat:     Mouth: Mucous membranes are moist.     Pharynx: Oropharynx is clear. No posterior oropharyngeal erythema.  Eyes:     Extraocular Movements: Extraocular movements intact.     Conjunctiva/sclera: Conjunctivae normal.     Pupils: Pupils are equal, round, and reactive to light.  Cardiovascular:     Rate and Rhythm: Normal rate and regular rhythm.     Pulses: Normal pulses.     Heart sounds: Normal heart sounds.  Pulmonary:     Effort: Pulmonary effort is normal.     Breath sounds: Normal breath sounds.  Abdominal:     General: There is no distension.     Palpations: Abdomen is soft. There is no mass.     Tenderness: There is no abdominal tenderness. There is no guarding.  Musculoskeletal:        General: Normal range of motion.     Cervical back: Normal range of motion.  Skin:    General: Skin is warm and dry.  Neurological:     Mental Status: She is alert and oriented to person, place, and time.     Motor: No weakness.     Gait: Gait normal.     UC Treatments / Results  Labs (all labs ordered are listed, but only abnormal results are displayed) Labs Reviewed - No data to display  EKG  Radiology No results found.  Procedures Procedures (including critical care time)  Medications Ordered in UC Medications - No data to display  Initial Impression / Assessment and Plan / UC Course  I have reviewed the triage vital signs and the nursing notes.  Pertinent labs & imaging results that were available during my care of the patient were reviewed by me and considered in my medical decision making (see chart for details).  Stable vitals, normal exam, no symptoms Refilled lactulose 30g daily prn, lasix 20 mg daily. She has PCP visit scheduled for end of this  month. I have also sent Zyrtec to start once daily She can also try olopatadine eyedrops.  Patient agrees to plan, no questions  Final Clinical Impressions(s) / UC Diagnoses   Final diagnoses:  Seasonal allergies  Liver disease, chronic  Discharge Instructions      I have refilled your lactulose and furosemide  Please keep follow up appointment with your primary care this month!  You can also start daily Zyrtec for allergies. I have sent allergy eyedrops to use as well    ED Prescriptions     Medication Sig Dispense Auth. Provider   furosemide (LASIX) 20 MG tablet Take 1 tablet (20 mg total) by mouth daily. 30 tablet Vinod Mikesell, PA-C   lactulose (CHRONULAC) 10 GM/15ML solution Take 45 mLs (30 g total) by mouth daily as needed for mild constipation. 236 mL Zackory Pudlo, PA-C   cetirizine (ZYRTEC ALLERGY) 10 MG tablet Take 1 tablet (10 mg total) by mouth daily. 30 tablet Willadene Mounsey, PA-C   olopatadine (PATANOL) 0.1 % ophthalmic solution Place 1 drop into both eyes 2 (two) times daily. 5 mL Claudell Wohler, Lurena Joiner, PA-C      PDMP not reviewed this encounter.   Marlow Baars, [place] 02/28/24 1416

## 2024-02-28 NOTE — ED Triage Notes (Signed)
 Patient is here because her child is here. Patient has no symptoms.

## 2024-02-28 NOTE — Discharge Instructions (Signed)
 I have refilled your lactulose and furosemide  Please keep follow up appointment with your primary care this month!  You can also start daily Zyrtec for allergies. I have sent allergy eyedrops to use as well

## 2024-03-18 ENCOUNTER — Ambulatory Visit (INDEPENDENT_AMBULATORY_CARE_PROVIDER_SITE_OTHER): Payer: BLUE CROSS/BLUE SHIELD | Admitting: Family

## 2024-03-18 VITALS — BP 123/73 | HR 86 | Temp 98.4°F | Resp 16 | Ht 64.0 in | Wt 170.0 lb

## 2024-03-18 DIAGNOSIS — Z7689 Persons encountering health services in other specified circumstances: Secondary | ICD-10-CM

## 2024-03-18 DIAGNOSIS — Z13220 Encounter for screening for lipoid disorders: Secondary | ICD-10-CM

## 2024-03-18 DIAGNOSIS — Z130 Encounter for screening for diseases of the blood and blood-forming organs and certain disorders involving the immune mechanism: Secondary | ICD-10-CM

## 2024-03-18 DIAGNOSIS — Z13228 Encounter for screening for other metabolic disorders: Secondary | ICD-10-CM

## 2024-03-18 DIAGNOSIS — Z0000 Encounter for general adult medical examination without abnormal findings: Secondary | ICD-10-CM | POA: Diagnosis not present

## 2024-03-18 DIAGNOSIS — B351 Tinea unguium: Secondary | ICD-10-CM | POA: Diagnosis not present

## 2024-03-18 DIAGNOSIS — Z1231 Encounter for screening mammogram for malignant neoplasm of breast: Secondary | ICD-10-CM

## 2024-03-18 DIAGNOSIS — Z124 Encounter for screening for malignant neoplasm of cervix: Secondary | ICD-10-CM

## 2024-03-18 DIAGNOSIS — Z131 Encounter for screening for diabetes mellitus: Secondary | ICD-10-CM

## 2024-03-18 DIAGNOSIS — Z1329 Encounter for screening for other suspected endocrine disorder: Secondary | ICD-10-CM

## 2024-03-18 NOTE — Progress Notes (Signed)
 Subjective:    Michelle Little - 38 y.o. female MRN [id number]  Date of birth: 05-22-1986  HPI  Michelle Little is to establish care and annual exam.  Current issues and/or concerns: - Reports toenail fungus bilateral feet.  - Reports established with specialist for cirrhosis.  - No further issues/concerns for discussion today.  ROS per HPI    Health Maintenance:  Health Maintenance Due  Topic Date Due   Medicare Annual Wellness (AWV)  Never done   Cervical Cancer Screening (HPV/Pap Cotest)  06/17/2023     Past Medical History: Patient Active Problem List   Diagnosis Date Noted   Wound dehiscence    Acute blood loss anemia 07/11/2021   Cirrhosis (HCC)    Acute on chronic anemia    Encephalopathy, hepatic (HCC)    Acute renal failure (HCC)    Other ascites    Acute encephalopathy 06/17/2021   Dyspepsia 11/14/2019   On Depo-Provera for contraception 12/06/2018   Iron deficiency anemia during pregnancy 11/09/2017   Uterine fibroid 09/12/2017   Cigarette smoker 09/10/2017      Social History   reports that she has been smoking cigarettes. She has never used smokeless tobacco. She reports that she does not currently use alcohol.   Family History  Family history is unknown by patient.   Medications: reviewed and updated   Objective:   Physical Exam BP 123/73   Pulse 86   Temp 98.4 F (36.9 C) (Oral)   Resp 16   Ht 5\' 4"  (1.626 m)   Wt 170 lb (77.1 kg)   LMP 02/28/2024   SpO2 100%   BMI 29.18 kg/m   Physical Exam HENT:     Head: Normocephalic and atraumatic.     Right Ear: Tympanic membrane, ear canal and external ear normal.     Left Ear: Tympanic membrane, ear canal and external ear normal.     Nose: Nose normal.     Mouth/Throat:     Mouth: Mucous membranes are moist.     Pharynx: Oropharynx is clear.  Eyes:     Extraocular Movements: Extraocular movements intact.     Conjunctiva/sclera: Conjunctivae normal.     Pupils: Pupils are equal, round,  and reactive to light.  Neck:     Thyroid: No thyroid mass, thyromegaly or thyroid tenderness.  Cardiovascular:     Rate and Rhythm: Normal rate and regular rhythm.     Pulses: Normal pulses.     Heart sounds: Normal heart sounds.  Pulmonary:     Effort: Pulmonary effort is normal.     Breath sounds: Normal breath sounds.  Chest:     Comments: Patient declined. Abdominal:     General: Bowel sounds are normal.     Palpations: Abdomen is soft.  Genitourinary:    Comments: Patient declined. Musculoskeletal:        General: Normal range of motion.     Right shoulder: Normal.     Left shoulder: Normal.     Right upper arm: Normal.     Left upper arm: Normal.     Right elbow: Normal.     Left elbow: Normal.     Right forearm: Normal.     Left forearm: Normal.     Right wrist: Normal.     Left wrist: Normal.     Right hand: Normal.     Left hand: Normal.     Cervical back: Normal, normal range of motion and neck supple.  Thoracic back: Normal.     Lumbar back: Normal.     Right hip: Normal.     Left hip: Normal.     Right upper leg: Normal.     Left upper leg: Normal.     Right knee: Normal.     Left knee: Normal.     Right lower leg: Normal.     Left lower leg: Normal.     Right ankle: Normal.     Left ankle: Normal.     Right foot: Normal.     Left foot: Normal.  Feet:     Right foot:     Toenail Condition: Right toenails are abnormally thick. Fungal disease present.    Left foot:     Toenail Condition: Left toenails are abnormally thick. Fungal disease present. Skin:    General: Skin is warm and dry.     Capillary Refill: Capillary refill takes less than 2 seconds.  Neurological:     General: No focal deficit present.     Mental Status: She is alert and oriented to person, place, and time.  Psychiatric:        Mood and Affect: Mood normal.        Behavior: Behavior normal.       Assessment & Plan:  1. Encounter to establish care (Primary) 2. Annual  physical exam - Counseled on 150 minutes of exercise per week as tolerated, healthy eating (including decreased daily intake of saturated fats, cholesterol, added sugars, sodium), STI prevention, and routine healthcare maintenance.  3. Screening for metabolic disorder - Routine screening.  - Basic Metabolic Panel  4. Screening for deficiency anemia - Routine screening.  - CBC  5. Diabetes mellitus screening - Routine screening.  - Hemoglobin A1c  6. Screening cholesterol level - Routine screening.  - Lipid panel  7. Thyroid disorder screen - Routine screening.  - TSH  8. Encounter for screening mammogram for malignant neoplasm of breast - Routine screening.  - MM Digital Screening; Future  9. Cervical cancer screening - Referral to Gynecology for evaluation/management. - Ambulatory referral to Gynecology  10. Onychomycosis - Referral to Podiatry for evaluation/management. - Ambulatory referral to Podiatry     Patient was given clear instructions to go to Emergency Department or return to medical center if symptoms don't improve, worsen, or new problems develop.The patient verbalized understanding.  I discussed the assessment and treatment plan with the patient. The patient was provided an opportunity to ask questions and all were answered. The patient agreed with the plan and demonstrated an understanding of the instructions.   The patient was advised to call back or seek an in-person evaluation if the symptoms worsen or if the condition fails to improve as anticipated.    Lavona Pounds, NP 03/18/2024, 1:39 PM Primary Care at [facility]

## 2024-03-18 NOTE — Progress Notes (Signed)
 Patient is here to established care with provider. ~health hx address ~care gaps address

## 2024-03-21 ENCOUNTER — Encounter (HOSPITAL_COMMUNITY): Payer: Self-pay

## 2024-03-21 ENCOUNTER — Encounter: Payer: Self-pay | Admitting: Family

## 2024-03-21 ENCOUNTER — Other Ambulatory Visit: Payer: Self-pay

## 2024-03-21 ENCOUNTER — Emergency Department (HOSPITAL_COMMUNITY)
Admission: EM | Admit: 2024-03-21 | Discharge: 2024-03-22 | Disposition: A | Discharge status: Home / Self Care | Attending: Emergency Medicine | Admitting: Emergency Medicine

## 2024-03-21 DIAGNOSIS — D509 Iron deficiency anemia, unspecified: Secondary | ICD-10-CM | POA: Insufficient documentation

## 2024-03-21 DIAGNOSIS — F17200 Nicotine dependence, unspecified, uncomplicated: Secondary | ICD-10-CM | POA: Diagnosis not present

## 2024-03-21 DIAGNOSIS — Z5189 Encounter for other specified aftercare: Secondary | ICD-10-CM

## 2024-03-21 LAB — BASIC METABOLIC PANEL WITH GFR
BUN/Creatinine Ratio: 11 (ref 9–23)
BUN: 8 mg/dL (ref 6–24)
CO2: 18 mmol/L — ABNORMAL LOW (ref 20–29)
Calcium: 8.5 mg/dL — ABNORMAL LOW (ref 8.7–10.2)
Chloride: 108 mmol/L — ABNORMAL HIGH (ref 96–106)
Creatinine, Ser: 0.71 mg/dL (ref 0.57–1.00)
Glucose: 76 mg/dL (ref 70–99)
Potassium: 4.2 mmol/L (ref 3.5–5.2)
Sodium: 137 mmol/L (ref 134–144)
eGFR: 110 mL/min/{1.73_m2} (ref >59)

## 2024-03-21 LAB — TSH: TSH: 1.34 u[IU]/mL (ref 0.450–4.500)

## 2024-03-21 LAB — COMPREHENSIVE METABOLIC PANEL WITH GFR
ALT: 25 U/L (ref 0–44)
AST: 39 U/L (ref 15–41)
Albumin: 2.8 g/dL — ABNORMAL LOW (ref 3.5–5.0)
Alkaline Phosphatase: 146 U/L — ABNORMAL HIGH (ref 38–126)
Anion gap: 8 (ref 5–15)
BUN: 7 mg/dL (ref 6–20)
CO2: 18 mmol/L — ABNORMAL LOW (ref 22–32)
Calcium: 8.2 mg/dL — ABNORMAL LOW (ref 8.9–10.3)
Chloride: 110 mmol/L (ref 98–111)
Creatinine, Ser: 0.72 mg/dL (ref 0.44–1.00)
GFR, Estimated: >60 mL/min (ref >60)
Glucose, Bld: 87 mg/dL (ref 70–99)
Potassium: 3.8 mmol/L (ref 3.5–5.1)
Sodium: 136 mmol/L (ref 135–145)
Total Bilirubin: 1.2 mg/dL (ref 0.0–1.2)
Total Protein: 6.6 g/dL (ref 6.5–8.1)

## 2024-03-21 LAB — CBC WITH DIFFERENTIAL/PLATELET
Abs Immature Granulocytes: 0.01 10*3/uL (ref 0.00–0.07)
Basophils Absolute: 0 10*3/uL (ref 0.0–0.1)
Basophils Relative: 1 %
Eosinophils Absolute: 0.1 10*3/uL (ref 0.0–0.5)
Eosinophils Relative: 3 %
HCT: 20.5 % — ABNORMAL LOW (ref 36.0–46.0)
Hemoglobin: 6 g/dL — CL (ref 12.0–15.0)
Immature Granulocytes: 0 %
Lymphocytes Relative: 45 %
Lymphs Abs: 1.8 10*3/uL (ref 0.7–4.0)
MCH: 21.6 pg — ABNORMAL LOW (ref 26.0–34.0)
MCHC: 29.3 g/dL — ABNORMAL LOW (ref 30.0–36.0)
MCV: 73.7 fL — ABNORMAL LOW (ref 80.0–100.0)
Monocytes Absolute: 0.5 10*3/uL (ref 0.1–1.0)
Monocytes Relative: 11 %
Neutro Abs: 1.6 10*3/uL — ABNORMAL LOW (ref 1.7–7.7)
Neutrophils Relative %: 40 %
Platelets: 75 10*3/uL — ABNORMAL LOW (ref 150–400)
RBC: 2.78 MIL/uL — ABNORMAL LOW (ref 3.87–5.11)
RDW: 23.6 % — ABNORMAL HIGH (ref 11.5–15.5)
WBC: 4 10*3/uL (ref 4.0–10.5)
nRBC: 0 % (ref 0.0–0.2)

## 2024-03-21 LAB — HEMOGLOBIN A1C
Est. average glucose Bld gHb Est-mCnc: 97 mg/dL
Hgb A1c MFr Bld: 5 % (ref 4.8–5.6)

## 2024-03-21 LAB — CBC
Hematocrit: 22 % — ABNORMAL LOW (ref 34.0–46.6)
Hemoglobin: 6.2 g/dL — CL (ref 11.1–15.9)
MCH: 20.7 pg — ABNORMAL LOW (ref 26.6–33.0)
MCHC: 28.2 g/dL — ABNORMAL LOW (ref 31.5–35.7)
MCV: 73 fL — ABNORMAL LOW (ref 79–97)
Platelets: 65 10*3/uL — CL (ref 150–450)
RBC: 3 x10E6/uL — ABNORMAL LOW (ref 3.77–5.28)
RDW: 19.8 % — ABNORMAL HIGH (ref 11.7–15.4)
WBC: 4 10*3/uL (ref 3.4–10.8)

## 2024-03-21 LAB — LIPID PANEL
Chol/HDL Ratio: 1.8 ratio (ref 0.0–4.4)
Cholesterol, Total: 102 mg/dL (ref 100–199)
HDL: 58 mg/dL (ref >39)
LDL Chol Calc (NIH): 30 mg/dL (ref 0–99)
Triglycerides: 62 mg/dL (ref 0–149)
VLDL Cholesterol Cal: 14 mg/dL (ref 5–40)

## 2024-03-21 LAB — PREPARE RBC (CROSSMATCH)

## 2024-03-21 MED ORDER — SODIUM CHLORIDE 0.9% IV SOLUTION: Freq: Once | INTRAVENOUS | Status: AC

## 2024-03-21 NOTE — ED Provider Notes (Signed)
 [place] [facility]ERGENCY DEPARTMENT AT [facility] Provider Note   CSN: [id number] Arrival date & time: 03/21/24  2036     History {Add pertinent medical, surgical, social history, OB history to HPI:1} Chief Complaint  Patient presents with   abnormal lab values    Michelle Little is a 38 y.o. female.  Patient with known history of iron deficiency anemia, cirrhosis of the liver, chronic anemia presents to the emergency department due to abnormal labs.  Patient was seen by primary care for establishment of care earlier in the week.  Basic labs were drawn and the patient was found to have a hemoglobin of 6.2.  She was recommended to come to the emergency department for possible transfusion and further evaluation.  Patient states that she previously had iron infusions and likely needs iron infusions again.  She denies any bleeding including rectal bleeding, hematemesis, dark stools.  Patient does endorse feeling mildly fatigued and short of breath with exertion, feels cold and warm rooms.  Denies chest pain, nausea, vomiting, abdominal pain, other symptoms at this time.  HPI     Home Medications Prior to Admission medications   Medication Sig Start Date End Date Taking? Authorizing Provider  cetirizine  (ZYRTEC  ALLERGY) 10 MG tablet Take 1 tablet (10 mg total) by mouth daily. 02/28/24   Rising, Rebecca, PA-C  fluticasone (FLONASE) 50 MCG/ACT nasal spray Place 2 sprays into the nose daily. 12/05/19   [provider]  folic acid  (FOLVITE ) 1 MG tablet Take 1 tablet (1 mg total) by mouth daily. Patient not taking: Reported on 07/24/2022 06/30/21   Haydee Lipa, MD  furosemide  (LASIX ) 20 MG tablet Take 1 tablet (20 mg total) by mouth daily. 02/28/24   Rising, Ivette Marks, PA-C  gabapentin (NEURONTIN) 100 MG capsule Take 100 mg by mouth 3 (three) times daily. 05/03/22   [provider]  lactulose  (CHRONULAC ) 10 GM/15ML solution Take 45 mLs (30 g total) by mouth daily as needed  for mild constipation. 02/28/24   Rising, Ivette Marks, PA-C  Multiple Vitamin (MULTIVITAMIN WITH MINERALS) TABS tablet Take 1 tablet by mouth daily. Patient not taking: Reported on 07/24/2022 06/30/21   Haydee Lipa, MD  olopatadine  (PATANOL) 0.1 % ophthalmic solution Place 1 drop into both eyes 2 (two) times daily. 02/28/24   Rising, Ivette Marks, PA-C  spironolactone  (ALDACTONE ) 100 MG tablet Take 1 tablet (100 mg total) by mouth daily. 07/24/22   Graciella Lavender, PA  thiamine  100 MG tablet Take 1 tablet (100 mg total) by mouth daily. Patient not taking: Reported on 07/24/2022 06/30/21   Haydee Lipa, MD      Allergies    Patient has no known allergies.    Review of Systems   Review of Systems  Physical Exam Updated Vital Signs BP 113/70 (BP Location: Right Arm)   Pulse 91   Temp 98.6 F (37 C)   Resp 16   Ht 5\' 4"  (1.626 m)   Wt 77.1 kg   LMP 02/28/2024 (Approximate)   SpO2 100%   BMI 29.18 kg/m  Physical Exam Vitals and nursing note reviewed.  Constitutional:      General: She is not in acute distress.    Appearance: She is well-developed.  HENT:     Head: Normocephalic and atraumatic.  Eyes:     Conjunctiva/sclera: Conjunctivae normal.  Cardiovascular:     Rate and Rhythm: Normal rate and regular rhythm.     Heart sounds: No murmur heard. Pulmonary:  Effort: Pulmonary effort is normal. No respiratory distress.     Breath sounds: Normal breath sounds.  Abdominal:     Palpations: Abdomen is soft.     Tenderness: There is no abdominal tenderness.  Musculoskeletal:        General: No swelling.     Cervical back: Neck supple.  Skin:    General: Skin is warm and dry.     Capillary Refill: Capillary refill takes less than 2 seconds.  Neurological:     Mental Status: She is alert.  Psychiatric:        Mood and Affect: Mood normal.     ED Results / Procedures / Treatments   Labs (all labs ordered are listed, but only abnormal results are displayed) Labs  Reviewed  CBC WITH DIFFERENTIAL/PLATELET - Abnormal; Notable for the following components:      Result Value   RBC 2.78 (*)    Hemoglobin 6.0 (*)    HCT 20.5 (*)    MCV 73.7 (*)    MCH 21.6 (*)    MCHC 29.3 (*)    RDW 23.6 (*)    Platelets 75 (*)    Neutro Abs 1.6 (*)    All other components within normal limits  COMPREHENSIVE METABOLIC PANEL WITH GFR - Abnormal; Notable for the following components:   CO2 18 (*)    Calcium 8.2 (*)    Albumin  2.8 (*)    Alkaline Phosphatase 146 (*)    All other components within normal limits  TYPE AND SCREEN  PREPARE RBC (CROSSMATCH)    EKG None  Radiology No results found.  Procedures .Critical Care  Performed by: Elisa Guest, PA-C Authorized by: Elisa Guest, PA-C   Critical care provider statement:    Critical care time (minutes):  30   Critical care time was exclusive of:  Separately billable procedures and treating other patients   Critical care was necessary to treat or prevent imminent or life-threatening deterioration of the following conditions:  Circulatory failure (Hemoglobin less than 7.0, symptomatic, requiring transfusion)   Critical care was time spent personally by me on the following activities:  Development of treatment plan with patient or surrogate, evaluation of patient's response to treatment, examination of patient, ordering and review of laboratory studies, ordering and performing treatments and interventions, pulse oximetry, re-evaluation of patient's condition and review of old charts   {Document cardiac monitor, telemetry assessment procedure when appropriate:1}  Medications Ordered in ED Medications  0.9 %  sodium chloride  infusion (Manually program via Guardrails IV Fluids) (has no administration in time range)    ED Course/ Medical Decision Making/ A&P   {   Click here for ABCD2, HEART and other calculatorsREFRESH Note before signing :1}                              Medical Decision  Making Amount and/or Complexity of Data Reviewed Labs: ordered.  Risk Prescription drug management.   This patient presents to the ED for concern of abnormal lab values, this involves an extensive number of treatment options, and is a complaint that carries with it a high risk of complications and morbidity.  The differential diagnosis includes iron deficiency anemia, anemia of chronic disease, acute blood loss anemia, others   Co morbidities that complicate the patient evaluation  Known iron deficiency anemia   Additional history obtained:  Additional history obtained from family at bedside External records from  outside source obtained and reviewed including primary care notes   Lab Tests:  I Ordered, and personally interpreted labs.  The pertinent results include: Hemoglobin 6.0, microcytic pattern; alkaline phosphatase 146 improved from previous   Imaging Studies ordered:  I ordered imaging studies including ***  I independently visualized and interpreted imaging which showed *** I agree with the radiologist interpretation   Cardiac Monitoring: / EKG:  The patient was maintained on a cardiac monitor.  I personally viewed and interpreted the cardiac monitored which showed an underlying rhythm of: ***   Problem List / ED Course / Critical interventions / Medication management   I ordered medication including 1 unit PRBC for symptomatic anemia Reevaluation of the patient after these medicines showed that the patient {resolved/improved/worsened:23923::"improved"} I have reviewed the patients home medicines and have made adjustments as needed   Consultations Obtained:  I requested consultation with the ***,  and discussed lab and imaging findings as well as pertinent plan - they recommend: ***   Social Determinants of Health:  ***   Test / Admission - Considered:  ***   {Document critical care time when appropriate:1} {Document review of labs and clinical  decision tools ie heart score, Chads2Vasc2 etc:1}  {Document your independent review of radiology images, and any outside records:1} {Document your discussion with family members, caretakers, and with consultants:1} {Document social determinants of health affecting pt's care:1} {Document your decision making why or why not admission, treatments were needed:1} Final Clinical Impression(s) / ED Diagnoses Final diagnoses:  None    Rx / DC Orders ED Discharge Orders     None

## 2024-03-21 NOTE — ED Triage Notes (Signed)
 Pt arrived from home via POV c/o abnormal lab values from PCP office. Per pt hgb 6.2

## 2024-03-21 NOTE — ED Notes (Signed)
 Date and time results received: 03/21/24 2155 (use smartphrase ".now" to insert current time)  Test: HGB Critical Value: 6.0  Name of Provider Notified: Florie Husband  Orders Received? Or Actions Taken?:

## 2024-03-22 ENCOUNTER — Telehealth: Payer: Self-pay

## 2024-03-22 DIAGNOSIS — D509 Iron deficiency anemia, unspecified: Secondary | ICD-10-CM | POA: Diagnosis not present

## 2024-03-22 LAB — HEMOGLOBIN AND HEMATOCRIT, BLOOD
HCT: 22.7 % — ABNORMAL LOW (ref 36.0–46.0)
Hemoglobin: 6.9 g/dL — CL (ref 12.0–15.0)

## 2024-03-22 NOTE — Telephone Encounter (Signed)
 I spoke with patient and made them aware  recommendations per PCP.  Patient verbalized understanding.  I transferred her to the front desk to schedule a follow up appointment.

## 2024-03-22 NOTE — Telephone Encounter (Signed)
 Copied from CRM [id number]. Topic: General - Call Back - No Documentation >> Mar 22, 2024 11:34 AM Lorenz Romano B wrote: Reason for CRM: patient says that she was advised to go to the er and get a blood transfusion on 04/29 please call patient [phone] she would like a call back she has some questions

## 2024-03-22 NOTE — Telephone Encounter (Signed)
 Call patient with update. Patient seen on 03/21/2024 - 03/22/2024 (7 hours) at [facility] Emergency Department at [facility]. Schedule follow-up appointment with Primary Care.

## 2024-03-22 NOTE — Telephone Encounter (Signed)
 error

## 2024-03-22 NOTE — Discharge Instructions (Signed)
 Your workup today was consistent with anemia.  We discussed possible admission to the hospital but you declined at this time stating you would like to follow-up with your primary care provider.  Please be sure to follow-up with your primary care provider to discuss possible iron supplementation and further evaluation as needed.  If you develop any life-threatening symptoms please return immediately to the emergency department.

## 2024-03-23 LAB — TYPE AND SCREEN: Unit division: 0

## 2024-03-23 LAB — BPAM RBC
Blood Product Expiration Date: 202505202359
ISSUE DATE / TIME: 202504290020
Unit Type and Rh: 7300

## 2024-03-24 ENCOUNTER — Ambulatory Visit: Admitting: Podiatry

## 2024-03-28 ENCOUNTER — Encounter: Payer: Self-pay | Admitting: Family

## 2024-03-28 ENCOUNTER — Ambulatory Visit (INDEPENDENT_AMBULATORY_CARE_PROVIDER_SITE_OTHER): Admitting: Family

## 2024-03-28 VITALS — BP 132/75 | HR 86 | Temp 98.4°F | Resp 15 | Ht 64.0 in | Wt 170.0 lb

## 2024-03-28 DIAGNOSIS — Z5189 Encounter for other specified aftercare: Secondary | ICD-10-CM | POA: Diagnosis not present

## 2024-03-28 DIAGNOSIS — D509 Iron deficiency anemia, unspecified: Secondary | ICD-10-CM | POA: Diagnosis not present

## 2024-03-28 MED ORDER — IRON (FERROUS SULFATE) 325 (65 FE) MG PO TABS: 325.0000 mg | ORAL_TABLET | Freq: Every day | ORAL | 0 refills | Status: DC

## 2024-03-28 NOTE — Progress Notes (Signed)
 Emergency room follow up.  Patient received a pint of blood in the emergency room

## 2024-03-28 NOTE — Progress Notes (Signed)
 Patient ID: Michelle Little, female    DOB: 1986-08-24  MRN: [id number]  CC: Emergency Department Follow-Up  Subjective: Michelle Little is a 38 y.o. female who presents for Emergency Department follow-up.   Her concerns today include:  03/21/2024 - 03/22/2024 [place] [facility] at [facility] per MD note: ED Course/ Medical Decision Making/ A&P                               Medical Decision Making Amount and/or Complexity of Data Reviewed Labs: ordered.   Risk Prescription drug management.     This patient presents to the ED for concern of abnormal lab values, this involves an extensive number of treatment options, and is a complaint that carries with it a high risk of complications and morbidity.  The differential diagnosis includes iron deficiency anemia, anemia of chronic disease, acute blood loss anemia, others     Co morbidities that complicate the patient evaluation   Known iron deficiency anemia     Additional history obtained:   Additional history obtained from family at bedside External records from outside source obtained and reviewed including primary care notes     Lab Tests:   I Ordered, and personally interpreted labs.  The pertinent results include: Hemoglobin 6.0, microcytic pattern; alkaline phosphatase 146 improved from previous; repeat hemoglobin 6.9     Problem List / ED Course / Critical interventions / Medication management     I ordered medication including 1 unit PRBC for symptomatic anemia Reevaluation of the patient after these medicines showed that the patient improved I have reviewed the patients home medicines and have made adjustments as needed     Social Determinants of Health:   Patient is a daily smoker     Test / Admission - Considered:   Discussed possible admission with patient due to symptomatic anemia.  Patient feels that with her hemoglobin getting back to the 7.0 range she is stable in her mind to  discharge home for outpatient follow-up.  Had a long discussion about risks of not treating this now.  I explained that I am unable to arrange for iron infusions.  Patient states that she will follow-up with her primary care team to arrange outpatient iron supplementation/infusions as needed.  She has had no adverse reaction to the blood administration.  She feels well.  Oxygen saturations are 95+ percent on room air.  Patient states she would like to discharge home and this does seem reasonable.  Patient to follow-up with primary care for further evaluation of her anemia.  Return precautions provided.   Today's office visit 03/28/2024: Reports feeling improved since Emergency Department discharge. Denies red flag symptoms. No issues/concerns for discussion.  Patient Active Problem List   Diagnosis Date Noted   Wound dehiscence    Acute blood loss anemia 07/11/2021   Cirrhosis (HCC)    Acute on chronic anemia    Encephalopathy, hepatic (HCC)    Acute renal failure (HCC)    Other ascites    Acute encephalopathy 06/17/2021   Dyspepsia 11/14/2019   On Depo-Provera for contraception 12/06/2018   Iron deficiency anemia during pregnancy 11/09/2017   Uterine fibroid 09/12/2017   Cigarette smoker 09/10/2017     Current Outpatient Medications on File Prior to Visit  Medication Sig Dispense Refill   furosemide  (LASIX ) 20 MG tablet Take 1 tablet (20 mg total) by mouth daily. 30 tablet 0  lactulose  (CHRONULAC ) 10 GM/15ML solution Take 45 mLs (30 g total) by mouth daily as needed for mild constipation. 236 mL 0   cetirizine  (ZYRTEC  ALLERGY) 10 MG tablet Take 1 tablet (10 mg total) by mouth daily. (Patient not taking: Reported on 03/28/2024) 30 tablet 2   fluticasone (FLONASE) 50 MCG/ACT nasal spray Place 2 sprays into the nose daily. (Patient not taking: Reported on 03/28/2024)     folic acid  (FOLVITE ) 1 MG tablet Take 1 tablet (1 mg total) by mouth daily. (Patient not taking: Reported on 07/24/2022) 30  tablet 01   gabapentin (NEURONTIN) 100 MG capsule Take 100 mg by mouth 3 (three) times daily. (Patient not taking: Reported on 03/28/2024)     Multiple Vitamin (MULTIVITAMIN WITH MINERALS) TABS tablet Take 1 tablet by mouth daily. (Patient not taking: Reported on 07/24/2022) 90 tablet 3   olopatadine  (PATANOL) 0.1 % ophthalmic solution Place 1 drop into both eyes 2 (two) times daily. (Patient not taking: Reported on 03/28/2024) 5 mL 0   spironolactone  (ALDACTONE ) 100 MG tablet Take 1 tablet (100 mg total) by mouth daily. (Patient not taking: Reported on 03/28/2024) 30 tablet 5   thiamine  100 MG tablet Take 1 tablet (100 mg total) by mouth daily. (Patient not taking: Reported on 07/24/2022) 30 tablet 1   No current facility-administered medications on file prior to visit.    No Known Allergies  Social History   Socioeconomic History   Marital status: Single    Spouse name: Not on file   Number of children: 3   Years of education: Not on file   Highest education level: Associate degree: academic program  Occupational History   Not on file  Tobacco Use   Smoking status: Every Day    Types: Cigarettes   Smokeless tobacco: Never  Vaping Use   Vaping status: Never Used  Substance and Sexual Activity   Alcohol use: Not Currently   Drug use: Not on file   Sexual activity: Not on file  Other Topics Concern   Not on file  Social History Narrative   Not on file   Social Drivers of Health   Financial Resource Strain: Medium Risk (03/18/2024)   Overall Financial Resource Strain (CARDIA)    Difficulty of Paying Living Expenses: Somewhat hard  Food Insecurity: Food Insecurity Present (03/18/2024)   Hunger Vital Sign    Worried About Running Out of Food in the Last Year: Sometimes true    Ran Out of Food in the Last Year: Sometimes true  Transportation Needs: Unmet Transportation Needs (03/18/2024)   PRAPARE - Transportation    Lack of Transportation (Medical): No    Lack of Transportation  (Non-Medical): Yes  Physical Activity: Insufficiently Active (03/18/2024)   Exercise Vital Sign    Days of Exercise per Week: 5 days    Minutes of Exercise per Session: 20 min  Stress: Stress Concern Present (03/18/2024)   [employer] of Occupational Health - Occupational Stress Questionnaire    Feeling of Stress : Very much  Social Connections: Unknown (03/18/2024)   Social Connection and Isolation Panel [NHANES]    Frequency of Communication with Friends and Family: Three times a week    Frequency of Social Gatherings with Friends and Family: Three times a week    Attends Religious Services: More than 4 times per year    Active Member of Clubs or Organizations: Patient unable to answer    Attends Club or Organization Meetings: 1 to 4 times per year  Marital Status: Not on file  Intimate Partner Violence: Not At Risk (03/18/2024)   Humiliation, Afraid, Rape, and Kick questionnaire    Fear of Current or Ex-Partner: No    Emotionally Abused: No    Physically Abused: No    Sexually Abused: No    Family History  Family history unknown: Yes    Past Surgical History:  Procedure Laterality Date   INCISION AND DRAINAGE OF WOUND N/A 07/24/2021   Procedure: Abdominal Wound Exploration With Vessel Ligation;  Surgeon: Dareen Ebbing, MD;  Location: [facility] OR;  Service: General;  Laterality: N/A;    ROS: Review of Systems Negative except as stated above  PHYSICAL EXAM: BP 132/75   Pulse 86   Temp 98.4 F (36.9 C) (Oral)   Resp 15   Ht 5\' 4"  (1.626 m)   Wt 170 lb (77.1 kg)   LMP 03/26/2024 (Exact Date)   SpO2 98%   BMI 29.18 kg/m   Physical Exam HENT:     Head: Normocephalic and atraumatic.     Nose: Nose normal.     Mouth/Throat:     Mouth: Mucous membranes are moist.     Pharynx: Oropharynx is clear.  Eyes:     Extraocular Movements: Extraocular movements intact.     Conjunctiva/sclera: Conjunctivae normal.     Pupils: Pupils are equal, round, and reactive to light.   Cardiovascular:     Rate and Rhythm: Normal rate and regular rhythm.     Pulses: Normal pulses.     Heart sounds: Normal heart sounds.  Pulmonary:     Effort: Pulmonary effort is normal.     Breath sounds: Normal breath sounds.  Musculoskeletal:        General: Normal range of motion.     Cervical back: Normal range of motion and neck supple.  Neurological:     General: No focal deficit present.     Mental Status: She is alert and oriented to person, place, and time.  Psychiatric:        Mood and Affect: Mood normal.        Behavior: Behavior normal.     ASSESSMENT AND PLAN: 1. Microcytic anemia (Primary) 2. Blood transfusion without reported diagnosis - Iron, Ferrous Sulfate as prescribed. Counseled on medication adherence/adverse effects.  - Referral to Hematology / Oncology for evaluation/management.  - Follow-up with primary provider as scheduled. - Ambulatory referral to Hematology / Oncology - Iron, Ferrous Sulfate, 325 (65 Fe) MG TABS; Take 325 mg by mouth daily.  Dispense: 90 tablet; Refill: 0   Patient was given the opportunity to ask questions.  Patient verbalized understanding of the plan and was able to repeat key elements of the plan. Patient was given clear instructions to go to Emergency Department or return to medical center if symptoms don't improve, worsen, or new problems develop.The patient verbalized understanding.   Orders Placed This Encounter  Procedures   Ambulatory referral to Hematology / Oncology     Requested Prescriptions   Signed Prescriptions Disp Refills   Iron, Ferrous Sulfate, 325 (65 Fe) MG TABS 90 tablet 0    Sig: Take 325 mg by mouth daily.    Follow-up with primary provider as scheduled.  Senaida Dama, NP

## 2024-03-31 ENCOUNTER — Encounter (HOSPITAL_COMMUNITY): Payer: Self-pay

## 2024-04-04 NOTE — Progress Notes (Unsigned)
 [facility]   [phone] [phone]   Clinic New consult Note   Patient Care Team: Michelle Dama, NP as PCP - General (Nurse Practitioner) 04/05/2024  CHIEF COMPLAINTS/PURPOSE OF CONSULTATION:  Anemia, referred by PCP Michelle Pounds, NP  HISTORY OF PRESENTING ILLNESS:  Michelle Little 38 y.o. female with PMH including alcoholic cirrhosis, encephalopathy, thrombocytopenia is here because of anemia.  First available CBC in epic 11/02/2017 at [facility] during pregnancy showed Hgb 6.5, MCV 69, normal platelet and white count.  Hemoglobinopathy panel showed normal Hgb A, A2, and F.  She was found to have iron  deficiency with serum iron  24, 4% saturation, TIBC 584, and a ferritin of 10.  Further workup showed normal folate 9.9 and low normal B12 of 279.  SPEP showed slightly elevated alpha, beta, and gammaglobulins without discrete abnormal bands, immunofixation was negative.  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  She was given IV iron  (Venofer). By 02/01/18 hemoglobin improved to 10.5, MCV normalized 93.5, iron  panel and ferritin normalized to 35.  By 04/03/2018 Hgb was stable at 10.6 and she had mild thrombocytopenia with platelets 138. She delivered youngest child who just turned 4 and she relocated. There is a gap in records until she was brought to our ED 06/17/2021 when found unresponsive at home in the setting of recent alcohol use, Hgb 1.8, MCV 125.9.  She was encephalopathic with concern for seizure and aspiration pneumonia, managed in the ICU, a total of 6 units pRBC's.  Anemia workup revealed folate deficiency started on folate but is no longer taking. DIC panel was abnormal.  Acute hepatitis panel was negative.  She had a paracentesis to rule out SBP.  She had prolonged leakage/bleeding from the site and was evaluated in ED triage over the course of several days in 06/2021 with downtrending  Hgb and was re-admitted and transfused, given FFP and vitamin K  and eventually taken to surgery for closure. Hgb remained low and bleeding persisted until 07/26/21, transfused RBC and PLT and pt left AMA. HCC screening in 2022 revealed normal AFP and abd US  with possible liver lesions but she did not pursue MRI.  Hgb improved to 9.8 in 07/24/22 but dropped to 6.0 on 03/18/24, transfused hgb small bump to 6.8; she was referred to hematology.  Socially, she is single with 3 children, not working disabled from cirrhosis.  She is independent with ADLs.  She smokes cigarettes, motivated to quit.  No alcohol use since 05/2021.  Family history of cancer significant for a sister with bladder cancer in her 56s.  Today she presents with her son Michelle Little.  She feels tired, cold, with exertional dyspnea and ice pica.  She had constipation when she took oral iron  in the past.  So far she has taken 1 dose/day of ferrous sulfate  without side effects so far.  She has heavy menstrual periods for about a year, heavy for 3-1/2 days where she will change a product every Cude 2-3 hours without clots.  This occurs monthly.  Denies other bleeding such as hematochezia, hematuria, epistaxis.  Denies recent surgery, trauma, or infection.    MEDICAL HISTORY:  Past Medical History:  Diagnosis Date   Cirrhosis, alcoholic (HCC)    Supratherapeutic INR    Thyroid disease     SURGICAL HISTORY: Past Surgical History:  Procedure Laterality Date   INCISION AND DRAINAGE OF WOUND N/A 07/24/2021   Procedure:  Abdominal Wound Exploration With Vessel Ligation;  Surgeon: Michelle Ebbing, MD;  Location: [facility];  Service: General;  Laterality: N/A;    SOCIAL HISTORY: Social History   Socioeconomic History   Marital status: Single    Spouse name: Not on file   Number of children: 3   Years of education: Not on file   Highest education level: Associate degree: academic program  Occupational History   Not on file  Tobacco Use   Smoking  status: Every Day    Types: Cigarettes   Smokeless tobacco: Never  Vaping Use   Vaping status: Never Used  Substance and Sexual Activity   Alcohol use: Not Currently   Drug use: Not on file   Sexual activity: Not on file  Other Topics Concern   Not on file  Social History Narrative   Not on file   Social Drivers of Health   Financial Resource Strain: Medium Risk (03/18/2024)   Overall Financial Resource Strain (CARDIA)    Difficulty of Paying Living Expenses: Somewhat hard  Food Insecurity: Food Insecurity Present (03/18/2024)   Hunger Vital Sign    Worried About Running Out of Food in the Last Year: Sometimes true    Ran Out of Food in the Last Year: Sometimes true  Transportation Needs: Unmet Transportation Needs (03/18/2024)   PRAPARE - Transportation    Lack of Transportation (Medical): No    Lack of Transportation (Non-Medical): Yes  Physical Activity: Insufficiently Active (03/18/2024)   Exercise Vital Sign    Days of Exercise per Week: 5 days    Minutes of Exercise per Session: 20 min  Stress: Stress Concern Present (03/18/2024)   [employer] of Occupational Health - Occupational Stress Questionnaire    Feeling of Stress : Very much  Social Connections: Unknown (03/18/2024)   Social Connection and Isolation Panel [NHANES]    Frequency of Communication with Friends and Family: Three times a week    Frequency of Social Gatherings with Friends and Family: Three times a week    Attends Religious Services: More than 4 times per year    Active Member of Clubs or Organizations: Patient unable to answer    Attends Club or Organization Meetings: 1 to 4 times per year    Marital Status: Not on file  Intimate Partner Violence: Not At Risk (03/18/2024)   Humiliation, Afraid, Rape, and Kick questionnaire    Fear of Current or Ex-Partner: No    Emotionally Abused: No    Physically Abused: No    Sexually Abused: No    FAMILY HISTORY: Family History  Family history unknown:  Yes    ALLERGIES:  has no known allergies.  MEDICATIONS:  Current Outpatient Medications  Medication Sig Dispense Refill   furosemide  (LASIX ) 20 MG tablet Take 1 tablet (20 mg total) by mouth daily. 30 tablet 0   Iron , Ferrous Sulfate , 325 (65 Fe) MG TABS Take 325 mg by mouth daily. 90 tablet 0   lactulose  (CHRONULAC ) 10 GM/15ML solution Take 45 mLs (30 g total) by mouth daily as needed for mild constipation. 236 mL 0   cetirizine  (ZYRTEC  ALLERGY) 10 MG tablet Take 1 tablet (10 mg total) by mouth daily. (Patient not taking: Reported on 04/05/2024) 30 tablet 2   fluticasone (FLONASE) 50 MCG/ACT nasal spray Place 2 sprays into the nose daily. (Patient not taking: Reported on 04/05/2024)     folic acid  (FOLVITE ) 1 MG tablet Take 1 tablet (1 mg total) by mouth daily. (Patient not taking:  Reported on 07/24/2022) 30 tablet 01   gabapentin (NEURONTIN) 100 MG capsule Take 100 mg by mouth 3 (three) times daily. (Patient not taking: Reported on 04/05/2024)     Multiple Vitamin (MULTIVITAMIN WITH MINERALS) TABS tablet Take 1 tablet by mouth daily. (Patient not taking: Reported on 07/24/2022) 90 tablet 3   olopatadine  (PATANOL) 0.1 % ophthalmic solution Place 1 drop into both eyes 2 (two) times daily. (Patient not taking: Reported on 04/05/2024) 5 mL 0   spironolactone  (ALDACTONE ) 100 MG tablet Take 1 tablet (100 mg total) by mouth daily. (Patient not taking: Reported on 04/05/2024) 30 tablet 5   thiamine  100 MG tablet Take 1 tablet (100 mg total) by mouth daily. (Patient not taking: Reported on 07/24/2022) 30 tablet 1   No current facility-administered medications for this visit.    REVIEW OF SYSTEMS:   Constitutional: Denies fevers, chills or abnormal night sweats (+) fatigue Eyes: Denies blurriness of vision, double vision or watery eyes Ears, nose, mouth, throat, and face: Denies mucositis, epistaxis, or sore throat Respiratory: Denies cough or wheezes (+) DOE Cardiovascular: Denies palpitation, chest  discomfort or lower extremity swelling Gastrointestinal:  Denies nausea, heartburn, hematochezia, or change in bowel habits  Skin: Denies abnormal skin rashes Lymphatics: Denies new lymphadenopathy or easy bruising Neurological:Denies numbness, tingling or new weaknesses Behavioral/Psych: Mood is stable, no new changes  All other systems were reviewed with the patient and are negative.  PHYSICAL EXAMINATION: ECOG PERFORMANCE STATUS: 1 - Symptomatic but completely ambulatory  Vitals:   04/05/24 1234  BP: 115/65  Pulse: 80  Resp: 17  Temp: 98.5 F (36.9 C)  SpO2: 99%   Filed Weights   04/05/24 1234  Weight: 169 lb 8 oz (76.9 kg)    GENERAL:alert, no distress and comfortable SKIN:  no rashes or significant lesions EYES: sclera clear NECK: without mass LYMPH:  no palpable cervical, supraclavicular lymphadenopathy  LUNGS: clear with normal breathing effort HEART: regular rate & rhythm, no lower extremity edema ABDOMEN:abdomen soft, non-tender and normal bowel sounds Musculoskeletal:no cyanosis of digits and no clubbing  PSYCH: alert & oriented x 3 with fluent speech NEURO: no focal motor/sensory deficits  LABORATORY DATA:  I have reviewed the data as listed    Latest Ref Rng & Units 03/22/2024    3:26 AM 03/21/2024    9:11 PM 03/18/2024    1:40 PM  CBC  WBC 4.0 - 10.5 K/uL  4.0  4.0   Hemoglobin 12.0 - 15.0 g/dL 6.9  6.0  6.2   Hematocrit 36.0 - 46.0 % 22.7  20.5  22.0   Platelets 150 - 400 K/uL  75  65        Latest Ref Rng & Units 03/21/2024    9:11 PM 03/18/2024    1:40 PM 07/24/2022    9:22 AM  CMP  Glucose 70 - 99 mg/dL 87  76  75   BUN 6 - 20 mg/dL 7  8  14    Creatinine 0.44 - 1.00 mg/dL 4.09  8.11  9.14   Sodium 135 - 145 mmol/L 136  137  136   Potassium 3.5 - 5.1 mmol/L 3.8  4.2  4.5   Chloride 98 - 111 mmol/L 110  108  106   CO2 22 - 32 mmol/L 18  18  24    Calcium 8.9 - 10.3 mg/dL 8.2  8.5  8.9   Total Protein 6.5 - 8.1 g/dL 6.6   7.6   Total Bilirubin  0.0 -  1.2 mg/dL 1.2   3.0   Alkaline Phos 38 - 126 U/L 146   234   AST 15 - 41 U/L 39   52   ALT 0 - 44 U/L 25   24      RADIOGRAPHIC STUDIES: I have personally reviewed the radiological images as listed and agreed with the findings in the report. No results found.  ASSESSMENT & PLAN: 38 yo female   Anemia -We reviewed her medical record in detail with the patient and family -She 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  -Anemia has persisted past pregnancy.  Lowest Hgb 1.8 in 05/2021 when hospitalized for decompensated cirrhosis, aspiration pneumonia, and critical illness.  Workup during that hospitalization showed folate deficiency, although she is no longer taking folic acid  -Hgb range 9 range until it dropped to 6 - 6.8 end of April, s/p 1 unit RBC 03/22/24. S/p total 22 units transfused from 06/17/21 - 03/22/24 -Given intermittently normal MCV this is not thalassemia. Hemoglobinopathies have been ruled out.  -No h/o bariatric surgery or infection. She has cirrhosis, which can contribute to cytopenias - She has heavy menstrual periods, likely contributing to IDA. -Will check iron  panel to see how much iron  replacement she needs, and repeat B12, MMA, and folic acid  to rule out nutritional anemia.  Will check reticulocyte panel -She is symptomatic with fatigue, exertional dyspnea, cold sensitivity, and ice pica.  She has taken 1 day of oral iron , tolerating thus far.  I recommend to take with vitamin C - Reviewed the risk/benefit, potential side effects including anaphylactic reaction, and logistics of IV iron , she is open. Will arrange at [facility] -We recommend EGD/colonoscopy to complete IDA work up and to r/o esophageal varices from cirrhosis. She is nervous but will consider it. -Pt seen with Dr. 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 bili is normal -on lasix  and lactulose  -Followed by GI.  - Discussed HCC screening including AFP and annual abdominal ultrasound, last done in 2022.  I recommend for her to reestablish with GI to continue this routinely. -Not had EGD for variceal screening or colonoscopy which I recommend to complete her anemia workup  Health maintenance  -Applauded her on alcohol cessation and encouraged her to quit smoking.  She plans to quit and join a gym.  Encouraged healthy active lifestyle -Has mammogram and Pap smear coming up -Continue age-appropriate cancer screenings and routine care   PLAN: -Medical record reviewed -Lab today (CBC, ferritin, iron /TIBC, B12, MMA, folate, retic panel) -Continue oral iron , take on empty stomach (if tolerable) with vitamin C source -IV iron  replacement starting asap -Lab and f/up in 4- 6 weeks after IV iron  to monitor her response  -Consider EGD/colonoscopy, continue HCC screening per GI -Pt seen with Dr. Maryalice Smaller   Orders Placed This Encounter  Procedures   CBC with Differential (Cancer Center Only)    Standing Status:   Future    Number of Occurrences:   1    Expiration Date:   04/05/2025   Ferritin    Standing Status:   Future    Number of Occurrences:   1    Expiration Date:  04/05/2025   Iron  and Iron  Binding Capacity ([facility] only)    Standing Status:   Future    Number of Occurrences:   1    Expiration Date:   04/05/2025   Retic Panel    Standing Status:   Future    Number of Occurrences:   1    Expiration Date:   04/05/2025   Vitamin B12    Standing Status:   Future    Number of Occurrences:   1    Expiration Date:   04/05/2025   Folate, Serum    Standing Status:   Future    Number of Occurrences:   1    Expiration Date:   04/05/2025   Methylmalonic acid, serum     Standing Status:   Future    Number of Occurrences:   1    Expiration Date:   04/05/2025      All questions were answered. The patient knows to call the clinic with any problems, questions or concerns.     Anesha Hackert K Renato Spellman, NP 04/05/24   Addendum I have seen the patient, examined her. I agree with the assessment and and plan and have edited the notes.   38 year old female with complicated medical history, including alcohol liver cirrhosis, thrombocytopenia, longstanding history of anemia, who was referred for iron  deficient anemia.  This is likely related to her menorrhagia, I recommend EGD to rule out varices bleeding.  Her hemoglobin has been in 6 range last month, she received a blood transfusion last week.  She does not tolerate oral iron  well due to constipation and gastric discomfort.  Will repeat a CBC, iron  study, B12, folate level, and reticulocyte count today.  Will start IV iron  as soon as possible, hopefully this week.  If her anemia does not resolve with adequate IV iron , then we will consider additional anemia workup.  I spent a total of 30 minutes for her visit today, more than 50% time on face-to-face counseling.  Sonja Hillrose MD 04/05/2024

## 2024-04-05 ENCOUNTER — Inpatient Hospital Stay: Attending: Nurse Practitioner | Admitting: Nurse Practitioner

## 2024-04-05 ENCOUNTER — Inpatient Hospital Stay

## 2024-04-05 ENCOUNTER — Telehealth: Payer: Self-pay

## 2024-04-05 ENCOUNTER — Encounter: Payer: Self-pay | Admitting: Nurse Practitioner

## 2024-04-05 VITALS — BP 115/65 | HR 80 | Temp 98.5°F | Resp 17 | Wt 169.5 lb

## 2024-04-05 DIAGNOSIS — K7030 Alcoholic cirrhosis of liver without ascites: Secondary | ICD-10-CM | POA: Diagnosis not present

## 2024-04-05 DIAGNOSIS — N920 Excessive and frequent menstruation with regular cycle: Secondary | ICD-10-CM | POA: Diagnosis not present

## 2024-04-05 DIAGNOSIS — Z79899 Other long term (current) drug therapy: Secondary | ICD-10-CM | POA: Diagnosis not present

## 2024-04-05 DIAGNOSIS — D696 Thrombocytopenia, unspecified: Secondary | ICD-10-CM | POA: Diagnosis not present

## 2024-04-05 DIAGNOSIS — D649 Anemia, unspecified: Secondary | ICD-10-CM

## 2024-04-05 LAB — CBC WITH DIFFERENTIAL (CANCER CENTER ONLY)
Abs Immature Granulocytes: 0.01 10*3/uL (ref 0.00–0.07)
Basophils Absolute: 0 10*3/uL (ref 0.0–0.1)
Basophils Relative: 1 %
Eosinophils Absolute: 0.1 10*3/uL (ref 0.0–0.5)
Eosinophils Relative: 2 %
HCT: 24.7 % — ABNORMAL LOW (ref 36.0–46.0)
Hemoglobin: 7.6 g/dL — ABNORMAL LOW (ref 12.0–15.0)
Immature Granulocytes: 0 %
Lymphocytes Relative: 41 %
Lymphs Abs: 1.5 10*3/uL (ref 0.7–4.0)
MCH: 22.8 pg — ABNORMAL LOW (ref 26.0–34.0)
MCHC: 30.8 g/dL (ref 30.0–36.0)
MCV: 74 fL — ABNORMAL LOW (ref 80.0–100.0)
Monocytes Absolute: 0.5 10*3/uL (ref 0.1–1.0)
Monocytes Relative: 12 %
Neutro Abs: 1.7 10*3/uL (ref 1.7–7.7)
Neutrophils Relative %: 44 %
Platelet Count: 88 10*3/uL — ABNORMAL LOW (ref 150–400)
RBC: 3.34 MIL/uL — ABNORMAL LOW (ref 3.87–5.11)
RDW: 25.5 % — ABNORMAL HIGH (ref 11.5–15.5)
WBC Count: 3.8 10*3/uL — ABNORMAL LOW (ref 4.0–10.5)
nRBC: 0 % (ref 0.0–0.2)

## 2024-04-05 LAB — IRON AND IRON BINDING CAPACITY (CC-WL,HP ONLY)
Iron: 25 ug/dL — ABNORMAL LOW (ref 28–170)
Saturation Ratios: 6 % — ABNORMAL LOW (ref 10.4–31.8)
TIBC: 421 ug/dL (ref 250–450)
UIBC: 396 ug/dL (ref 148–442)

## 2024-04-05 LAB — FERRITIN: Ferritin: 8 ng/mL — ABNORMAL LOW (ref 11–307)

## 2024-04-05 LAB — RETIC PANEL
Immature Retic Fract: 33.7 % — ABNORMAL HIGH (ref 2.3–15.9)
RBC.: 3.36 MIL/uL — ABNORMAL LOW (ref 3.87–5.11)
Retic Count, Absolute: 51.1 10*3/uL (ref 19.0–186.0)
Retic Ct Pct: 1.5 % (ref 0.4–3.1)
Reticulocyte Hemoglobin: 28.4 pg (ref >27.9)

## 2024-04-05 LAB — FOLATE: Folate: 9.1 ng/mL (ref >5.9)

## 2024-04-05 LAB — VITAMIN B12: Vitamin B-12: 802 pg/mL (ref 180–914)

## 2024-04-05 NOTE — Telephone Encounter (Signed)
 Michelle Little, patient will be scheduled as soon as possible.  Auth Submission: NO AUTH NEEDED Site of care: Site of care: [facility] Payer: [employer] dual complete Medication & CPT/J Code(s) submitted: Venofer (Iron  Sucrose) J1756 Route of submission (phone, fax, portal):  Phone # Fax # Auth type: Buy/Bill PB Units/visits requested: 200mg  x 5 doses Reference number:  Approval from: 04/05/24 to 09/05/24

## 2024-04-06 ENCOUNTER — Telehealth: Payer: Self-pay | Admitting: Nurse Practitioner

## 2024-04-06 NOTE — Telephone Encounter (Signed)
 Scheduled patient's appts. Advised patient to contact us if rescheduling is needed. Provided my direct line.

## 2024-04-07 ENCOUNTER — Ambulatory Visit
Admission: RE | Admit: 2024-04-07 | Discharge: 2024-04-07 | Disposition: A | Discharge status: Home / Self Care | Source: Ambulatory Visit | Attending: Family | Admitting: Family

## 2024-04-07 DIAGNOSIS — Z1231 Encounter for screening mammogram for malignant neoplasm of breast: Secondary | ICD-10-CM

## 2024-04-08 ENCOUNTER — Ambulatory Visit (INDEPENDENT_AMBULATORY_CARE_PROVIDER_SITE_OTHER)

## 2024-04-08 VITALS — BP 114/76 | HR 70 | Temp 98.8°F | Resp 16 | Ht 64.0 in | Wt 171.0 lb

## 2024-04-08 DIAGNOSIS — D649 Anemia, unspecified: Secondary | ICD-10-CM | POA: Diagnosis not present

## 2024-04-08 MED ORDER — IRON SUCROSE 20 MG/ML IV SOLN
200.0000 mg | Freq: Once | INTRAVENOUS | Status: AC
  Administered 2024-04-08: 200 mg via INTRAVENOUS
  Filled 2024-04-08: qty 10

## 2024-04-08 NOTE — Progress Notes (Signed)
 Diagnosis: Acute Anemia  Provider:  Praveen Mannam MD  Procedure: IV Push  IV Type: Peripheral, IV Location: L Forearm  Venofer (Iron  Sucrose), Dose: 200 mg  Post Infusion IV Care: Observation period completed  Discharge: Condition: Good, Destination: Home . AVS Provided  Performed by:  Rachelle Bue, RN

## 2024-04-11 ENCOUNTER — Ambulatory Visit

## 2024-04-11 VITALS — BP 113/80 | HR 78 | Temp 98.4°F | Resp 16 | Ht 64.0 in | Wt 172.4 lb

## 2024-04-11 DIAGNOSIS — D649 Anemia, unspecified: Secondary | ICD-10-CM

## 2024-04-11 MED ORDER — IRON SUCROSE 20 MG/ML IV SOLN
200.0000 mg | Freq: Once | INTRAVENOUS | Status: AC
  Administered 2024-04-11: 200 mg via INTRAVENOUS
  Filled 2024-04-11: qty 10

## 2024-04-11 MED ORDER — SODIUM CHLORIDE 0.9 % IV BOLUS
250.0000 mL | Freq: Once | INTRAVENOUS | Status: AC
  Administered 2024-04-11: 250 mL via INTRAVENOUS
  Filled 2024-04-11: qty 250

## 2024-04-11 NOTE — Progress Notes (Signed)
 Diagnosis: Iron  Deficiency Anemia  Provider:  Praveen Mannam MD  Procedure: IV Push  IV Type: Peripheral, IV Location: L Hand  Venofer  (Iron  Sucrose), Dose: 200 mg  Post Infusion IV Care: Observation period completed and Peripheral IV Discontinued Patient only asked to wait 20 minutes  Discharge: Condition: Good, Destination: Home . AVS Declined  Performed by:  Star East, LPN

## 2024-04-12 ENCOUNTER — Ambulatory Visit: Payer: Self-pay | Admitting: Family

## 2024-04-12 ENCOUNTER — Ambulatory Visit: Payer: Self-pay | Admitting: Nurse Practitioner

## 2024-04-13 ENCOUNTER — Ambulatory Visit

## 2024-04-13 LAB — METHYLMALONIC ACID, SERUM: Methylmalonic Acid, Quantitative: 94 nmol/L (ref 0–378)

## 2024-04-13 MED ORDER — IRON SUCROSE 20 MG/ML IV SOLN: 200.0000 mg | Freq: Once | INTRAVENOUS | Status: DC

## 2024-04-14 ENCOUNTER — Encounter: Payer: Self-pay | Admitting: Nurse Practitioner

## 2024-04-15 ENCOUNTER — Ambulatory Visit (INDEPENDENT_AMBULATORY_CARE_PROVIDER_SITE_OTHER)

## 2024-04-15 VITALS — BP 110/70 | HR 73 | Temp 98.6°F | Resp 19 | Ht 64.0 in | Wt 169.2 lb

## 2024-04-15 DIAGNOSIS — D649 Anemia, unspecified: Secondary | ICD-10-CM | POA: Diagnosis not present

## 2024-04-15 MED ORDER — IRON SUCROSE 20 MG/ML IV SOLN
200.0000 mg | Freq: Once | INTRAVENOUS | Status: AC
  Administered 2024-04-15: 200 mg via INTRAVENOUS
  Filled 2024-04-15: qty 10

## 2024-04-15 NOTE — Progress Notes (Signed)
 Diagnosis: Acute Anemia  Provider:  Mannam, Praveen MD  Procedure: IV Push  IV Type: Peripheral, IV Location: R Antecubital  Venofer  (Iron  Sucrose), Dose: 200 mg  Post Infusion IV Care: Observation period completed and Peripheral IV Discontinued. 20 minutes observation per patient request.  Discharge: Condition: Stable, Destination: Home . AVS Declined  Performed by:  Lendel Quant, RN

## 2024-04-19 ENCOUNTER — Ambulatory Visit

## 2024-04-19 VITALS — BP 107/71 | HR 65 | Temp 98.4°F | Resp 18 | Ht 64.0 in | Wt 169.8 lb

## 2024-04-19 DIAGNOSIS — D649 Anemia, unspecified: Secondary | ICD-10-CM | POA: Diagnosis not present

## 2024-04-19 MED ORDER — IRON SUCROSE 20 MG/ML IV SOLN
200.0000 mg | Freq: Once | INTRAVENOUS | Status: AC
  Administered 2024-04-19: 200 mg via INTRAVENOUS
  Filled 2024-04-19: qty 10

## 2024-04-19 NOTE — Progress Notes (Signed)
 Diagnosis: Iron Deficiency Anemia  Provider:  Chilton Greathouse MD  Procedure: IV Push  IV Type: Peripheral, IV Location: L Antecubital  Venofer (Iron Sucrose), Dose: 200 mg  Post Infusion IV Care: Patient declined observation and Peripheral IV Discontinued  Discharge: Condition: Good, Destination: Home . AVS Declined  Performed by:  Adriana Mccallum, RN

## 2024-04-21 ENCOUNTER — Ambulatory Visit

## 2024-04-21 VITALS — BP 123/81 | HR 76 | Temp 98.5°F | Resp 16 | Ht 64.0 in | Wt 169.2 lb

## 2024-04-21 DIAGNOSIS — D649 Anemia, unspecified: Secondary | ICD-10-CM

## 2024-04-21 MED ORDER — SODIUM CHLORIDE 0.9 % IV BOLUS
250.0000 mL | Freq: Once | INTRAVENOUS | Status: AC
  Administered 2024-04-21: 250 mL via INTRAVENOUS
  Filled 2024-04-21: qty 250

## 2024-04-21 MED ORDER — IRON SUCROSE 20 MG/ML IV SOLN
200.0000 mg | Freq: Once | INTRAVENOUS | Status: AC
  Administered 2024-04-21: 200 mg via INTRAVENOUS
  Filled 2024-04-21: qty 10

## 2024-04-21 NOTE — Progress Notes (Signed)
 Diagnosis: Acute Anemia  Provider:  Praveen Mannam MD  Procedure: IV Push  IV Type: Peripheral, IV Location: L Hand  Venofer  (Iron  Sucrose), Dose: 200 mg  Post Infusion IV Care: Patient declined observation and Peripheral IV Discontinued  Discharge: Condition: Good, Destination: Home . AVS Declined  Performed by:  Shirly Dow, RN

## 2024-04-25 ENCOUNTER — Ambulatory Visit: Admitting: Podiatry

## 2024-05-04 ENCOUNTER — Encounter: Admitting: Obstetrics and Gynecology

## 2024-05-09 ENCOUNTER — Other Ambulatory Visit: Payer: Self-pay | Admitting: Nurse Practitioner

## 2024-05-09 ENCOUNTER — Other Ambulatory Visit: Payer: Self-pay

## 2024-05-09 DIAGNOSIS — D649 Anemia, unspecified: Secondary | ICD-10-CM

## 2024-05-09 NOTE — Progress Notes (Deleted)
 [facility]   Telephone:(336) [phone] Fax:(336) [phone]    Patient Care Team: Senaida Dama, NP as PCP - General (Nurse Practitioner)   CHIEF COMPLAINT: Follow up IDA  CURRENT THERAPY: Oral iron  + IV iron  (Venofer ) PRN - 200 mg x5 over 5/19 - 04/11/24  INTERVAL HISTORY Michelle Little returns for follow up, last seen by me 04/05/24. She received IV iron  at [facility]  ROS   Past Medical History:  Diagnosis Date   Cirrhosis, alcoholic (HCC)    Supratherapeutic INR    Thyroid disease      Past Surgical History:  Procedure Laterality Date   INCISION AND DRAINAGE OF WOUND N/A 07/24/2021   Procedure: Abdominal Wound Exploration With Vessel Ligation;  Surgeon: Dareen Ebbing, MD;  Location: [facility] OR;  Service: General;  Laterality: N/A;     Outpatient Encounter Medications as of 05/10/2024  Medication Sig   cetirizine  (ZYRTEC  ALLERGY) 10 MG tablet Take 1 tablet (10 mg total) by mouth daily. (Patient not taking: Reported on 04/05/2024)   fluticasone (FLONASE) 50 MCG/ACT nasal spray Place 2 sprays into the nose daily. (Patient not taking: Reported on 04/05/2024)   folic acid  (FOLVITE ) 1 MG tablet Take 1 tablet (1 mg total) by mouth daily. (Patient not taking: Reported on 07/24/2022)   furosemide  (LASIX ) 20 MG tablet Take 1 tablet (20 mg total) by mouth daily.   gabapentin (NEURONTIN) 100 MG capsule Take 100 mg by mouth 3 (three) times daily. (Patient not taking: Reported on 04/05/2024)   Iron , Ferrous Sulfate , 325 (65 Fe) MG TABS Take 325 mg by mouth daily.   lactulose  (CHRONULAC ) 10 GM/15ML solution Take 45 mLs (30 g total) by mouth daily as needed for mild constipation.   Multiple Vitamin (MULTIVITAMIN WITH MINERALS) TABS tablet Take 1 tablet by mouth daily. (Patient not taking: Reported on 07/24/2022)   olopatadine  (PATANOL) 0.1 % ophthalmic solution Place 1 drop into both eyes 2 (two) times daily. (Patient not taking: Reported on 04/05/2024)   spironolactone  (ALDACTONE ) 100 MG  tablet Take 1 tablet (100 mg total) by mouth daily. (Patient not taking: Reported on 04/05/2024)   thiamine  100 MG tablet Take 1 tablet (100 mg total) by mouth daily. (Patient not taking: Reported on 07/24/2022)   No facility-administered encounter medications on file as of 05/10/2024.     There were no vitals filed for this visit. There is no height or weight on file to calculate BMI.   ECOG PERFORMANCE STATUS: {CHL ONC ECOG PS:[id number]}  PHYSICAL EXAM GENERAL:alert, no distress and comfortable SKIN: no rash  EYES: sclera clear NECK: without mass LYMPH:  no palpable cervical or supraclavicular lymphadenopathy  LUNGS: clear with normal breathing effort HEART: regular rate & rhythm, no lower extremity edema ABDOMEN: abdomen soft, non-tender and normal bowel sounds NEURO: alert & oriented x 3 with fluent speech, no focal motor/sensory deficits Breast exam:  PAC without erythema    CBC    Latest Ref Rng & Units 04/05/2024    1:47 PM 03/22/2024    3:26 AM 03/21/2024    9:11 PM  CBC  WBC 4.0 - 10.5 K/uL 3.8   4.0   Hemoglobin 12.0 - 15.0 g/dL 7.6  6.9  6.0   Hematocrit 36.0 - 46.0 % 24.7  22.7  20.5   Platelets 150 - 400 K/uL 88   75       CMP     Latest Ref Rng & Units 03/21/2024    9:11 PM  03/18/2024    1:40 PM 07/24/2022    9:22 AM  CMP  Glucose 70 - 99 mg/dL 87  76  75   BUN 6 - 20 mg/dL 7  8  14    Creatinine 0.44 - 1.00 mg/dL 1.30  8.65  7.84   Sodium 135 - 145 mmol/L 136  137  136   Potassium 3.5 - 5.1 mmol/L 3.8  4.2  4.5   Chloride 98 - 111 mmol/L 110  108  106   CO2 22 - 32 mmol/L 18  18  24    Calcium 8.9 - 10.3 mg/dL 8.2  8.5  8.9   Total Protein 6.5 - 8.1 g/dL 6.6   7.6   Total Bilirubin 0.0 - 1.2 mg/dL 1.2   3.0   Alkaline Phos 38 - 126 U/L 146   234   AST 15 - 41 U/L 39   52   ALT 0 - 44 U/L 25   24       ASSESSMENT & PLAN:38 yo female    Anemia -We reviewed her medical record in detail with the patient and family -She has had 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  -Anemia has persisted past pregnancy.  Lowest Hgb 1.8 in 05/2021 when hospitalized for decompensated cirrhosis, aspiration pneumonia, and critical illness.  Workup during that hospitalization showed folate deficiency, although she is no longer taking folic acid  -Hgb range 9 range until it dropped to 6 - 6.8 end of April, s/p 1 unit RBC 03/22/24. S/p total 22 units transfused from 06/17/21 - 03/22/24 -Given intermittently normal MCV this is not thalassemia. Hemoglobinopathies have been ruled out.  -No h/o bariatric surgery or infection. She has cirrhosis, which can contribute to cytopenias - She has heavy menstrual periods, likely contributing to IDA. -Will check iron  panel to see how much iron  replacement she needs, and repeat B12, MMA, and folic acid  to rule out nutritional anemia.  Will check reticulocyte panel -She is symptomatic with fatigue, exertional dyspnea, cold sensitivity, and ice pica.  She has taken 1 day of oral iron , tolerating thus far.  I recommend to take with vitamin C - Reviewed the risk/benefit, potential side effects including anaphylactic reaction, and logistics of IV iron , she is open. Will arrange at [facility] -We recommend EGD/colonoscopy to complete IDA work up and to r/o esophageal varices from cirrhosis. She is nervous but will consider it. -Pt seen with Dr. 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 bili is normal -on lasix  and lactulose  -Followed by GI.  - Discussed HCC screening including AFP and annual abdominal ultrasound, last done in 2022.  I recommend for her to reestablish with GI to continue this routinely. -Not had EGD for variceal screening or  colonoscopy which I recommend to complete her anemia workup   Health maintenance  -Applauded her on alcohol cessation and encouraged her to quit smoking.  She plans to quit and join a gym.  Encouraged healthy active lifestyle -Has mammogram and Pap smear coming up -Continue age-appropriate cancer screenings and routine care    PLAN:  No orders of the defined types were placed in this encounter.     All  questions were answered. The patient knows to call the clinic with any problems, questions or concerns. No barriers to learning were detected. I spent *** counseling the patient face to face. The total time spent in the appointment was *** and more than 50% was on counseling, review of test results, and coordination of care.   Michelle Brallier K Brandalynn Ofallon, NP 05/09/2024 4:11 PM

## 2024-05-10 ENCOUNTER — Ambulatory Visit: Admitting: Nurse Practitioner

## 2024-05-10 ENCOUNTER — Telehealth: Payer: Self-pay | Admitting: Nurse Practitioner

## 2024-05-10 ENCOUNTER — Other Ambulatory Visit

## 2024-05-10 NOTE — Telephone Encounter (Signed)
 Michelle Little cancelled her appointment. I returned her call to re-schedule. I provided our call back number for re-scheduling purposes.

## 2024-05-23 NOTE — Progress Notes (Unsigned)
 [facility]   Telephone:(336) [phone] Fax:(336) [phone]    Patient Care Team: Lorren Greig PARAS, NP as PCP - General (Nurse Practitioner)   CHIEF COMPLAINT: Follow up IDA  CURRENT THERAPY: Oral iron  + IV iron  (Venofer ) PRN - 200 mg x5 over 5/19 - 04/11/24  INTERVAL HISTORY Michelle Little returns for follow up, last seen by me 04/05/24. She received IV iron  at [facility]  ROS   Past Medical History:  Diagnosis Date   Cirrhosis, alcoholic (HCC)    Supratherapeutic INR    Thyroid disease      Past Surgical History:  Procedure Laterality Date   INCISION AND DRAINAGE OF WOUND N/A 07/24/2021   Procedure: Abdominal Wound Exploration With Vessel Ligation;  Surgeon: Belinda Cough, MD;  Location: [facility] OR;  Service: General;  Laterality: N/A;     Outpatient Encounter Medications as of 05/24/2024  Medication Sig   cetirizine  (ZYRTEC  ALLERGY) 10 MG tablet Take 1 tablet (10 mg total) by mouth daily. (Patient not taking: Reported on 04/05/2024)   fluticasone (FLONASE) 50 MCG/ACT nasal spray Place 2 sprays into the nose daily. (Patient not taking: Reported on 04/05/2024)   folic acid  (FOLVITE ) 1 MG tablet Take 1 tablet (1 mg total) by mouth daily. (Patient not taking: Reported on 07/24/2022)   furosemide  (LASIX ) 20 MG tablet Take 1 tablet (20 mg total) by mouth daily.   gabapentin (NEURONTIN) 100 MG capsule Take 100 mg by mouth 3 (three) times daily. (Patient not taking: Reported on 04/05/2024)   Iron , Ferrous Sulfate , 325 (65 Fe) MG TABS Take 325 mg by mouth daily.   lactulose  (CHRONULAC ) 10 GM/15ML solution Take 45 mLs (30 g total) by mouth daily as needed for mild constipation.   Multiple Vitamin (MULTIVITAMIN WITH MINERALS) TABS tablet Take 1 tablet by mouth daily. (Patient not taking: Reported on 07/24/2022)   olopatadine  (PATANOL) 0.1 % ophthalmic solution Place 1 drop into both eyes 2 (two) times daily. (Patient not taking: Reported on 04/05/2024)   spironolactone  (ALDACTONE ) 100 MG  tablet Take 1 tablet (100 mg total) by mouth daily. (Patient not taking: Reported on 04/05/2024)   thiamine  100 MG tablet Take 1 tablet (100 mg total) by mouth daily. (Patient not taking: Reported on 07/24/2022)   No facility-administered encounter medications on file as of 05/24/2024.     There were no vitals filed for this visit. There is no height or weight on file to calculate BMI.   ECOG PERFORMANCE STATUS: {CHL ONC ECOG [phone]}  PHYSICAL EXAM GENERAL:alert, no distress and comfortable SKIN: no rash  EYES: sclera clear NECK: without mass LYMPH:  no palpable cervical or supraclavicular lymphadenopathy  LUNGS: clear with normal breathing effort HEART: regular rate & rhythm, no lower extremity edema ABDOMEN: abdomen soft, non-tender and normal bowel sounds NEURO: alert & oriented x 3 with fluent speech, no focal motor/sensory deficits Breast exam:  PAC without erythema    CBC    Latest Ref Rng & Units 04/05/2024    1:47 PM 03/22/2024    3:26 AM 03/21/2024    9:11 PM  CBC  WBC 4.0 - 10.5 K/uL 3.8   4.0   Hemoglobin 12.0 - 15.0 g/dL 7.6  6.9  6.0   Hematocrit 36.0 - 46.0 % 24.7  22.7  20.5   Platelets 150 - 400 K/uL 88   75       CMP     Latest Ref Rng & Units 03/21/2024    9:11 PM  03/18/2024    1:40 PM 07/24/2022    9:22 AM  CMP  Glucose 70 - 99 mg/dL 87  76  75   BUN 6 - 20 mg/dL 7  8  14    Creatinine 0.44 - 1.00 mg/dL 9.27  9.28  9.16   Sodium 135 - 145 mmol/L 136  137  136   Potassium 3.5 - 5.1 mmol/L 3.8  4.2  4.5   Chloride 98 - 111 mmol/L 110  108  106   CO2 22 - 32 mmol/L 18  18  24    Calcium 8.9 - 10.3 mg/dL 8.2  8.5  8.9   Total Protein 6.5 - 8.1 g/dL 6.6   7.6   Total Bilirubin 0.0 - 1.2 mg/dL 1.2   3.0   Alkaline Phos 38 - 126 U/L 146   234   AST 15 - 41 U/L 39   52   ALT 0 - 44 U/L 25   24       ASSESSMENT & PLAN:38 yo female    Anemia -We reviewed her medical record in detail with the patient and family -She has had 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  -Anemia has persisted past pregnancy.  Lowest Hgb 1.8 in 05/2021 when hospitalized for decompensated cirrhosis, aspiration pneumonia, and critical illness.  Workup during that hospitalization showed folate deficiency, although she is no longer taking folic acid  -Hgb range 9 range until it dropped to 6 - 6.8 end of April, s/p 1 unit RBC 03/22/24. S/p total 22 units transfused from 06/17/21 - 03/22/24 -Given intermittently normal MCV this is not thalassemia. Hemoglobinopathies have been ruled out.  -No h/o bariatric surgery or infection. She has cirrhosis, which can contribute to cytopenias - She has heavy menstrual periods, likely contributing to IDA. -Will check iron  panel to see how much iron  replacement she needs, and repeat B12, MMA, and folic acid  to rule out nutritional anemia.  Will check reticulocyte panel -She is symptomatic with fatigue, exertional dyspnea, cold sensitivity, and ice pica.  -We recommend EGD/colonoscopy to complete IDA work up and to r/o esophageal varices from cirrhosis. She is nervous but will consider it. 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 bili is normal -on lasix  and lactulose  -Followed by GI.  - Discussed HCC screening including AFP and annual abdominal ultrasound, last done in 2022.  I recommend for her to reestablish with GI to continue this routinely. -Not had EGD for variceal screening or colonoscopy which I recommend to complete her anemia workup   Health maintenance  -Applauded her on alcohol cessation and encouraged her to quit smoking.  She plans to quit and join a gym.  Encouraged  healthy active lifestyle -Has mammogram and Pap smear coming up -Continue age-appropriate cancer screenings and routine care    PLAN:  No orders of the defined types were placed in this encounter.     All questions were answered. The patient knows to call the clinic with any problems, questions or concerns. No barriers to learning were detected. I spent *** counseling the patient face to face. The total time spent in the  appointment was *** and more than 50% was on counseling, review of test results, and coordination of care.   Murel Wigle K Gittel Mccamish, NP 05/23/2024 4:52 PM

## 2024-05-24 ENCOUNTER — Inpatient Hospital Stay (HOSPITAL_BASED_OUTPATIENT_CLINIC_OR_DEPARTMENT_OTHER): Admitting: Nurse Practitioner

## 2024-05-24 ENCOUNTER — Inpatient Hospital Stay: Attending: Nurse Practitioner

## 2024-05-24 ENCOUNTER — Encounter: Payer: Self-pay | Admitting: Nurse Practitioner

## 2024-05-24 VITALS — BP 130/68 | HR 71 | Temp 98.1°F | Resp 17 | Wt 170.2 lb

## 2024-05-24 DIAGNOSIS — D61818 Other pancytopenia: Secondary | ICD-10-CM | POA: Diagnosis not present

## 2024-05-24 DIAGNOSIS — D649 Anemia, unspecified: Secondary | ICD-10-CM

## 2024-05-24 DIAGNOSIS — D509 Iron deficiency anemia, unspecified: Secondary | ICD-10-CM | POA: Insufficient documentation

## 2024-05-24 LAB — CBC WITH DIFFERENTIAL (CANCER CENTER ONLY)
Abs Immature Granulocytes: 0.01 10*3/uL (ref 0.00–0.07)
Basophils Absolute: 0 10*3/uL (ref 0.0–0.1)
Basophils Relative: 1 %
Eosinophils Absolute: 0.1 10*3/uL (ref 0.0–0.5)
Eosinophils Relative: 4 %
HCT: 27.1 % — ABNORMAL LOW (ref 36.0–46.0)
Hemoglobin: 8.9 g/dL — ABNORMAL LOW (ref 12.0–15.0)
Immature Granulocytes: 0 %
Lymphocytes Relative: 45 %
Lymphs Abs: 1.4 10*3/uL (ref 0.7–4.0)
MCH: 28 pg (ref 26.0–34.0)
MCHC: 32.8 g/dL (ref 30.0–36.0)
MCV: 85.2 fL (ref 80.0–100.0)
Monocytes Absolute: 0.3 10*3/uL (ref 0.1–1.0)
Monocytes Relative: 10 %
Neutro Abs: 1.3 10*3/uL — ABNORMAL LOW (ref 1.7–7.7)
Neutrophils Relative %: 40 %
Platelet Count: 64 10*3/uL — ABNORMAL LOW (ref 150–400)
RBC: 3.18 MIL/uL — ABNORMAL LOW (ref 3.87–5.11)
RDW: 23.9 % — ABNORMAL HIGH (ref 11.5–15.5)
WBC Count: 3.2 10*3/uL — ABNORMAL LOW (ref 4.0–10.5)
nRBC: 0 % (ref 0.0–0.2)

## 2024-05-24 LAB — RETIC PANEL
Immature Retic Fract: 9.4 % (ref 2.3–15.9)
RBC.: 3.23 MIL/uL — ABNORMAL LOW (ref 3.87–5.11)
Retic Count, Absolute: 50.7 10*3/uL (ref 19.0–186.0)
Retic Ct Pct: 1.6 % (ref 0.4–3.1)
Reticulocyte Hemoglobin: 33.3 pg (ref >27.9)

## 2024-05-24 LAB — IRON AND IRON BINDING CAPACITY (CC-WL,HP ONLY)
Iron: 123 ug/dL (ref 28–170)
Saturation Ratios: 51 % — ABNORMAL HIGH (ref 10.4–31.8)
TIBC: 242 ug/dL — ABNORMAL LOW (ref 250–450)
UIBC: 119 ug/dL — ABNORMAL LOW (ref 148–442)

## 2024-05-24 LAB — FERRITIN: Ferritin: 67 ng/mL (ref 11–307)

## 2024-06-21 ENCOUNTER — Ambulatory Visit: Admitting: Family Medicine

## 2024-07-20 ENCOUNTER — Encounter: Admitting: Family

## 2024-07-20 NOTE — Progress Notes (Signed)
 Erroneous encounter-disregard

## 2024-07-21 ENCOUNTER — Ambulatory Visit: Admitting: Family

## 2024-07-29 ENCOUNTER — Other Ambulatory Visit: Payer: Self-pay | Admitting: Family

## 2024-07-29 DIAGNOSIS — D509 Iron deficiency anemia, unspecified: Secondary | ICD-10-CM

## 2024-07-29 DIAGNOSIS — Z5189 Encounter for other specified aftercare: Secondary | ICD-10-CM

## 2024-07-29 NOTE — Telephone Encounter (Signed)
 Complete

## 2024-08-22 NOTE — Progress Notes (Deleted)
 [facility]   Telephone:(336) [phone] Fax:(336) [phone]    Patient Care Team: Lorren Greig PARAS, NP as PCP - General (Nurse Practitioner)   CHIEF COMPLAINT: Follow up IDA  CURRENT THERAPY: Oral iron  + IV iron  (Venofer ) PRN - 200 mg x5 over 5/19 - 04/11/24   INTERVAL HISTORY   ROS   Past Medical History:  Diagnosis Date   Cirrhosis, alcoholic (HCC)    Supratherapeutic INR    Thyroid disease      Past Surgical History:  Procedure Laterality Date   INCISION AND DRAINAGE OF WOUND N/A 07/24/2021   Procedure: Abdominal Wound Exploration With Vessel Ligation;  Surgeon: Belinda Cough, MD;  Location: [facility] OR;  Service: General;  Laterality: N/A;     Outpatient Encounter Medications as of 08/23/2024  Medication Sig   cetirizine  (ZYRTEC  ALLERGY) 10 MG tablet Take 1 tablet (10 mg total) by mouth daily. (Patient not taking: Reported on 05/24/2024)   FEROSUL 325 (65 Fe) MG tablet TAKE ONE TABLET BY MOUTH ONCE DAILY   fluticasone (FLONASE) 50 MCG/ACT nasal spray Place 2 sprays into the nose daily. (Patient not taking: Reported on 05/24/2024)   folic acid  (FOLVITE ) 1 MG tablet Take 1 tablet (1 mg total) by mouth daily. (Patient not taking: Reported on 05/24/2024)   furosemide  (LASIX ) 20 MG tablet Take 1 tablet (20 mg total) by mouth daily.   gabapentin (NEURONTIN) 100 MG capsule Take 100 mg by mouth 3 (three) times daily. (Patient not taking: Reported on 05/24/2024)   lactulose  (CHRONULAC ) 10 GM/15ML solution Take 45 mLs (30 g total) by mouth daily as needed for mild constipation.   Multiple Vitamin (MULTIVITAMIN WITH MINERALS) TABS tablet Take 1 tablet by mouth daily. (Patient not taking: Reported on 05/24/2024)   olopatadine  (PATANOL) 0.1 % ophthalmic solution Place 1 drop into both eyes 2 (two) times daily. (Patient not taking: Reported on 05/24/2024)   spironolactone  (ALDACTONE ) 100 MG tablet Take 1 tablet (100 mg total) by mouth daily. (Patient not taking: Reported on 05/24/2024)    thiamine  100 MG tablet Take 1 tablet (100 mg total) by mouth daily. (Patient not taking: Reported on 05/24/2024)   No facility-administered encounter medications on file as of 08/23/2024.     There were no vitals filed for this visit. There is no height or weight on file to calculate BMI.   ECOG PERFORMANCE STATUS: {CHL ONC ECOG [phone]}  PHYSICAL EXAM GENERAL:alert, no distress and comfortable SKIN: no rash  EYES: sclera clear NECK: without mass LYMPH:  no palpable cervical or supraclavicular lymphadenopathy  LUNGS: clear with normal breathing effort HEART: regular rate & rhythm, no lower extremity edema ABDOMEN: abdomen soft, non-tender and normal bowel sounds NEURO: alert & oriented x 3 with fluent speech, no focal motor/sensory deficits Breast exam:  PAC without erythema    CBC    Latest Ref Rng & Units 05/24/2024   10:07 AM 04/05/2024    1:47 PM 03/22/2024    3:26 AM  CBC  WBC 4.0 - 10.5 K/uL 3.2  3.8    Hemoglobin 12.0 - 15.0 g/dL 8.9  7.6  6.9   Hematocrit 36.0 - 46.0 % 27.1  24.7  22.7   Platelets 150 - 400 K/uL 64  88        CMP     Latest Ref Rng & Units 03/21/2024    9:11 PM 03/18/2024    1:40 PM 07/24/2022    9:22 AM  CMP  Glucose 70 -  99 mg/dL 87  76  75   BUN 6 - 20 mg/dL 7  8  14    Creatinine 0.44 - 1.00 mg/dL 9.27  9.28  9.16   Sodium 135 - 145 mmol/L 136  137  136   Potassium 3.5 - 5.1 mmol/L 3.8  4.2  4.5   Chloride 98 - 111 mmol/L 110  108  106   CO2 22 - 32 mmol/L 18  18  24    Calcium 8.9 - 10.3 mg/dL 8.2  8.5  8.9   Total Protein 6.5 - 8.1 g/dL 6.6   7.6   Total Bilirubin 0.0 - 1.2 mg/dL 1.2   3.0   Alkaline Phos 38 - 126 U/L 146   234   AST 15 - 41 U/L 39   52   ALT 0 - 44 U/L 25   24       ASSESSMENT & PLAN:  PLAN:  No orders of the defined types were placed in this encounter.     All questions were answered. The patient knows to call the clinic with any problems, questions or concerns. No barriers to learning were detected. I  spent *** counseling the patient face to face. The total time spent in the appointment was *** and more than 50% was on counseling, review of test results, and coordination of care.   Jacen Carlini K Aairah Negrette, NP 08/22/2024 1:36 PM

## 2024-08-23 ENCOUNTER — Inpatient Hospital Stay

## 2024-08-23 ENCOUNTER — Inpatient Hospital Stay: Admitting: Nurse Practitioner

## 2024-08-24 ENCOUNTER — Telehealth: Payer: Self-pay | Admitting: Nurse Practitioner

## 2024-08-24 NOTE — Telephone Encounter (Signed)
 Katana rescheduled her appointment.

## 2024-09-05 ENCOUNTER — Inpatient Hospital Stay (HOSPITAL_BASED_OUTPATIENT_CLINIC_OR_DEPARTMENT_OTHER): Admitting: Nurse Practitioner

## 2024-09-05 ENCOUNTER — Other Ambulatory Visit: Payer: Self-pay | Admitting: *Deleted

## 2024-09-05 ENCOUNTER — Inpatient Hospital Stay: Attending: Nurse Practitioner

## 2024-09-05 VITALS — BP 126/81 | HR 92 | Temp 98.8°F | Resp 18 | Ht 64.0 in | Wt 166.5 lb

## 2024-09-05 DIAGNOSIS — Z5189 Encounter for other specified aftercare: Secondary | ICD-10-CM | POA: Diagnosis not present

## 2024-09-05 DIAGNOSIS — D709 Neutropenia, unspecified: Secondary | ICD-10-CM | POA: Diagnosis not present

## 2024-09-05 DIAGNOSIS — K7460 Unspecified cirrhosis of liver: Secondary | ICD-10-CM | POA: Diagnosis not present

## 2024-09-05 DIAGNOSIS — N920 Excessive and frequent menstruation with regular cycle: Secondary | ICD-10-CM | POA: Diagnosis not present

## 2024-09-05 DIAGNOSIS — D61818 Other pancytopenia: Secondary | ICD-10-CM | POA: Diagnosis not present

## 2024-09-05 DIAGNOSIS — K7469 Other cirrhosis of liver: Secondary | ICD-10-CM

## 2024-09-05 DIAGNOSIS — D509 Iron deficiency anemia, unspecified: Secondary | ICD-10-CM | POA: Diagnosis present

## 2024-09-05 DIAGNOSIS — D696 Thrombocytopenia, unspecified: Secondary | ICD-10-CM | POA: Diagnosis not present

## 2024-09-05 DIAGNOSIS — D649 Anemia, unspecified: Secondary | ICD-10-CM

## 2024-09-05 LAB — IRON AND IRON BINDING CAPACITY (CC-WL,HP ONLY)
Iron: 71 ug/dL (ref 28–170)
Saturation Ratios: 29 % (ref 10.4–31.8)
TIBC: 244 ug/dL — ABNORMAL LOW (ref 250–450)
UIBC: 173 ug/dL (ref 148–442)

## 2024-09-05 LAB — CBC WITH DIFFERENTIAL (CANCER CENTER ONLY)
Abs Immature Granulocytes: 0 K/uL (ref 0.00–0.07)
Basophils Absolute: 0 K/uL (ref 0.0–0.1)
Basophils Relative: 1 %
Eosinophils Absolute: 0.2 K/uL (ref 0.0–0.5)
Eosinophils Relative: 5 %
HCT: 31.3 % — ABNORMAL LOW (ref 36.0–46.0)
Hemoglobin: 10.9 g/dL — ABNORMAL LOW (ref 12.0–15.0)
Immature Granulocytes: 0 %
Lymphocytes Relative: 44 %
Lymphs Abs: 1.4 K/uL (ref 0.7–4.0)
MCH: 30.8 pg (ref 26.0–34.0)
MCHC: 34.8 g/dL (ref 30.0–36.0)
MCV: 88.4 fL (ref 80.0–100.0)
Monocytes Absolute: 0.3 K/uL (ref 0.1–1.0)
Monocytes Relative: 10 %
Neutro Abs: 1.3 K/uL — ABNORMAL LOW (ref 1.7–7.7)
Neutrophils Relative %: 40 %
Platelet Count: 49 K/uL — ABNORMAL LOW (ref 150–400)
RBC: 3.54 MIL/uL — ABNORMAL LOW (ref 3.87–5.11)
RDW: 15.9 % — ABNORMAL HIGH (ref 11.5–15.5)
WBC Count: 3.1 K/uL — ABNORMAL LOW (ref 4.0–10.5)
nRBC: 0 % (ref 0.0–0.2)

## 2024-09-05 LAB — IMMATURE PLATELET FRACTION: Immature Platelet Fraction: 4.9 % (ref 1.2–8.6)

## 2024-09-05 LAB — FERRITIN: Ferritin: 88 ng/mL (ref 11–307)

## 2024-09-05 MED ORDER — FEROSUL 325 (65 FE) MG PO TABS: 325.0000 mg | ORAL_TABLET | Freq: Every day | ORAL | 0 refills | Status: DC

## 2024-09-05 MED ORDER — LACTULOSE 10 GM/15ML PO SOLN: 30.0000 g | Freq: Every day | ORAL | 0 refills | Status: DC | PRN

## 2024-09-05 MED ORDER — FUROSEMIDE 20 MG PO TABS: 20.0000 mg | ORAL_TABLET | Freq: Every day | ORAL | 0 refills | Status: DC

## 2024-09-05 NOTE — Progress Notes (Signed)
 [facility]   Telephone:([phone]) [phone] Fax:(336) [phone]    Patient Care Team: Lorren Greig PARAS, NP as PCP - General (Nurse Practitioner)   CHIEF COMPLAINT: Follow up IDA  CURRENT THERAPY: Oral iron  + IV iron  (Venofer ) PRN - 200 mg x5 over 5/19 - 04/11/24   INTERVAL HISTORY Ms. Boss returns for follow-up as scheduled, last seen by me 05/24/2024.  Feels good lately with more energy and less exertional dyspnea.  Has been taking oral iron  which she tolerates well, no constipation or bad side effects but she ran out recently.  Denies abnormal bleeding, fever, night sweats, weight loss, pain, or any other new or specific complaints.  ROS  All other systems reviewed and negative  Past Medical History:  Diagnosis Date   Cirrhosis, alcoholic (HCC)    Supratherapeutic INR    Thyroid disease      Past Surgical History:  Procedure Laterality Date   INCISION AND DRAINAGE OF WOUND N/A 07/24/2021   Procedure: Abdominal Wound Exploration With Vessel Ligation;  Surgeon: Belinda Cough, MD;  Location: [facility] OR;  Service: General;  Laterality: N/A;     Outpatient Encounter Medications as of 09/05/2024  Medication Sig   spironolactone  (ALDACTONE ) 100 MG tablet Take 1 tablet (100 mg total) by mouth daily.   [DISCONTINUED] FEROSUL 325 (65 Fe) MG tablet TAKE ONE TABLET BY MOUTH ONCE DAILY   [DISCONTINUED] furosemide  (LASIX ) 20 MG tablet Take 1 tablet (20 mg total) by mouth daily.   [DISCONTINUED] lactulose  (CHRONULAC ) 10 GM/15ML solution Take 45 mLs (30 g total) by mouth daily as needed for mild constipation.   cetirizine  (ZYRTEC  ALLERGY) 10 MG tablet Take 1 tablet (10 mg total) by mouth daily. (Patient not taking: Reported on 05/24/2024)   FEROSUL 325 (65 Fe) MG tablet Take 1 tablet (325 mg total) by mouth daily.   fluticasone (FLONASE) 50 MCG/ACT nasal spray Place 2 sprays into the nose daily. (Patient not taking: Reported on 05/24/2024)   folic acid  (FOLVITE ) 1 MG tablet Take 1 tablet  (1 mg total) by mouth daily. (Patient not taking: Reported on 05/24/2024)   furosemide  (LASIX ) 20 MG tablet Take 1 tablet (20 mg total) by mouth daily.   gabapentin (NEURONTIN) 100 MG capsule Take 100 mg by mouth 3 (three) times daily. (Patient not taking: Reported on 09/05/2024)   lactulose  (CHRONULAC ) 10 GM/15ML solution Take 45 mLs (30 g total) by mouth daily as needed for mild constipation.   Multiple Vitamin (MULTIVITAMIN WITH MINERALS) TABS tablet Take 1 tablet by mouth daily. (Patient not taking: Reported on 05/24/2024)   olopatadine  (PATANOL) 0.1 % ophthalmic solution Place 1 drop into both eyes 2 (two) times daily. (Patient not taking: Reported on 05/24/2024)   thiamine  100 MG tablet Take 1 tablet (100 mg total) by mouth daily. (Patient not taking: Reported on 05/24/2024)   No facility-administered encounter medications on file as of 09/05/2024.     Today's Vitals   09/05/24 1218  BP: 126/81  Pulse: 92  Resp: 18  Temp: 98.8 F (37.1 C)  TempSrc: Temporal  SpO2: 100%  Weight: 166 lb 8 oz (75.5 kg)  Height: 5' 4 (1.626 m)  PainSc: 0-No pain   Body mass index is 28.58 kg/m.    PHYSICAL EXAM GENERAL:alert, no distress and comfortable SKIN: no rash  EYES: sclera clear NECK: without mass LYMPH:  no palpable cervical or supraclavicular lymphadenopathy  LUNGS: clear with normal breathing effort HEART: regular rate & rhythm, no lower  extremity edema ABDOMEN: abdomen soft, non-tender and normal bowel sounds NEURO: alert & oriented x 3 with fluent speech, no focal motor/sensory deficits   CBC    Latest Ref Rng & Units 09/05/2024   11:45 AM 05/24/2024   10:07 AM 04/05/2024    1:47 PM  CBC  WBC 4.0 - 10.5 K/uL 3.1  3.2  3.8   Hemoglobin 12.0 - 15.0 g/dL 89.0  8.9  7.6   Hematocrit 36.0 - 46.0 % 31.3  27.1  24.7   Platelets 150 - 400 K/uL 49  64  88       CMP     Latest Ref Rng & Units 03/21/2024    9:11 PM 03/18/2024    1:40 PM 07/24/2022    9:22 AM  CMP  Glucose 70 - 99  mg/dL 87  76  75   BUN 6 - 20 mg/dL 7  8  14    Creatinine 0.44 - 1.00 mg/dL 9.27  9.28  9.16   Sodium 135 - 145 mmol/L 136  137  136   Potassium 3.5 - 5.1 mmol/L 3.8  4.2  4.5   Chloride 98 - 111 mmol/L 110  108  106   CO2 22 - 32 mmol/L 18  18  24    Calcium 8.9 - 10.3 mg/dL 8.2  8.5  8.9   Total Protein 6.5 - 8.1 g/dL 6.6   7.6   Total Bilirubin 0.0 - 1.2 mg/dL 1.2   3.0   Alkaline Phos 38 - 126 U/L 146   234   AST 15 - 41 U/L 39   52   ALT 0 - 44 U/L 25   24       ASSESSMENT & PLAN:  Anemia -She reports lifelong anemia, 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  -Anemia has persisted past pregnancy.  Lowest Hgb 1.8 in 05/2021 when hospitalized for decompensated cirrhosis, aspiration pneumonia, and critical illness.  Workup during that hospitalization showed folate deficiency, although she is no longer taking folic acid  and most recent folate normal in 03/2024 -Hgb baseline 9 range until it dropped to 6 - 6.8 end of April, s/p 1 unit RBC 03/22/24. S/p total 22 units transfused from 06/17/21 - 03/22/24 -Given intermittently normal MCV this is not thalassemia. Hemoglobinopathies have been ruled out.  -No h/o bariatric surgery or infection. She has cirrhosis, which can contribute to cytopenias.  -No h/o B12 or folate deficiencies  - ?medication, only on lasix  and iron ; lasix  does list anemia, leukopenia, and thrombocytopenia on SE profile but incidence is not known and I have low suspicion it is related -She has heavy menstrual periods, likely contributing to IDA, confirmed on lab with ferritin 8 she was symptomatic with fatigue, DOE, pica, and cold intolerance  -IDA normalized but she had persistent anemia, Hgb 8.9 ion 05/24/24 and continues to improve, back to baseline today 10.9 on 09/05/24. 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 bili is normal -on lasix  and lactulose  -Followed by GI.  - Discussed HCC screening including AFP and annual abdominal ultrasound, last done in 2023.  I again recommend for her  to reestablish with GI to continue this routinely.  -Not had EGD for variceal screening or colonoscopy which I recommend to complete her anemia workup. She is ready to meet with GI now   Health maintenance  -Applauded her on alcohol cessation and encouraged her to quit smoking.  She plans to quit and join a gym.  Encouraged healthy active lifestyle -Mammogram benign 03/2024 and Pap smear coming up -Continue age-appropriate cancer screenings and routine care     Disposition:  Ms. Cassetta appears stable. She tolerated and responded to IV iron , symptoms improved. Hgb 10.9 which is her baseline, ferritin normal. Continue oral iron .   She has progressive thrombocytopenia plt 49K and new neutropenia ANC 1.3. Still having menorrhagia but no other bleeding and denies infection. I continue to recommend EGD/colonoscopy and it appears she is ready to make an appointment with GI, she plans to call today.   Further work up shows normal immature plt fraction and low retic. Light chains and SPEP are abnormal but no M spike, this is non specific. ITP is not likely. Worsening plt/anc likely secondary to cirrhosis. A primary bone marrow disorder such as MDS has not been ruled out, but given her age and no symptoms, my suspicion is not high. Will hold off bone marrow biopsy.   She is scheduled for ABD US  next week, I will call with results. AFP normal.   Will check lab monthly, and f/up in 3 months, or sooner if needed.  The case was discussed with Dr. Lanny.      Orders Placed This Encounter  Procedures   US  Abdomen Complete    Standing  Status:   Future    Expected Date:   09/12/2024    Expiration Date:   09/05/2025    Reason for Exam (SYMPTOM  OR DIAGNOSIS REQUIRED):   pancytopenia, cirrhosis, HCC screening    Preferred imaging location?:   [facility]   AFP tumor marker    Standing Status:   Future    Number of Occurrences:   1    Expected Date:   09/05/2024    Expiration Date:   09/05/2025   Immature Platelet Fraction    Standing Status:   Future    Number of Occurrences:   1    Expected Date:   09/05/2024    Expiration Date:   09/05/2025      All questions were answered. The patient knows to call the clinic with any problems, questions or concerns. No barriers to learning were detected. Total time in the encounter was 30 minutes, more than 50% spent on results review, counseling, and coordination of care.   Rayshaun Needle K Lelah Rennaker, NP 09/09/2024

## 2024-09-06 LAB — AFP TUMOR MARKER: AFP, Serum, Tumor Marker: 2.5 ng/mL (ref 0.0–6.4)

## 2024-09-06 LAB — KAPPA/LAMBDA LIGHT CHAINS
Kappa free light chain: 39.4 mg/L — ABNORMAL HIGH (ref 3.3–19.4)
Kappa, lambda light chain ratio: 1.13 (ref 0.26–1.65)
Lambda free light chains: 35 mg/L — ABNORMAL HIGH (ref 5.7–26.3)

## 2024-09-07 LAB — COPPER, SERUM: Copper: 90 ug/dL (ref 80–158)

## 2024-09-08 ENCOUNTER — Other Ambulatory Visit: Payer: Self-pay

## 2024-09-08 DIAGNOSIS — D649 Anemia, unspecified: Secondary | ICD-10-CM

## 2024-09-08 LAB — MULTIPLE MYELOMA PANEL, SERUM
Albumin SerPl Elph-Mcnc: 3 g/dL (ref 2.9–4.4)
Albumin/Glob SerPl: 0.9 (ref 0.7–1.7)
Alpha 1: 0.2 g/dL (ref 0.0–0.4)
Alpha2 Glob SerPl Elph-Mcnc: 0.4 g/dL (ref 0.4–1.0)
B-Globulin SerPl Elph-Mcnc: 0.9 g/dL (ref 0.7–1.3)
Gamma Glob SerPl Elph-Mcnc: 2 g/dL — ABNORMAL HIGH (ref 0.4–1.8)
Globulin, Total: 3.5 g/dL (ref 2.2–3.9)
IgA: 477 mg/dL — ABNORMAL HIGH (ref 87–352)
IgG (Immunoglobin G), Serum: 2236 mg/dL — ABNORMAL HIGH (ref 586–1602)
IgM (Immunoglobulin M), Srm: 123 mg/dL (ref 26–217)
Total Protein ELP: 6.5 g/dL (ref 6.0–8.5)

## 2024-09-09 ENCOUNTER — Encounter: Payer: Self-pay | Admitting: Nurse Practitioner

## 2024-09-09 ENCOUNTER — Ambulatory Visit: Payer: Self-pay | Admitting: Nurse Practitioner

## 2024-09-10 LAB — HEAVY METALS, BLOOD
Arsenic: 2 ug/L (ref 0–9)
Lead: <1 ug/dL (ref 0.0–3.4)
Mercury: <1 ug/L (ref 0.0–14.9)

## 2024-09-15 ENCOUNTER — Ambulatory Visit (HOSPITAL_COMMUNITY)

## 2024-09-16 ENCOUNTER — Other Ambulatory Visit: Payer: Self-pay | Admitting: Nurse Practitioner

## 2024-09-16 DIAGNOSIS — D649 Anemia, unspecified: Secondary | ICD-10-CM

## 2024-09-16 DIAGNOSIS — D61818 Other pancytopenia: Secondary | ICD-10-CM

## 2024-09-28 ENCOUNTER — Ambulatory Visit (HOSPITAL_COMMUNITY)
Admission: RE | Admit: 2024-09-28 | Discharge: 2024-09-28 | Disposition: A | Discharge status: Home / Self Care | Source: Ambulatory Visit | Attending: Nurse Practitioner | Admitting: Nurse Practitioner

## 2024-09-28 DIAGNOSIS — K7469 Other cirrhosis of liver: Secondary | ICD-10-CM | POA: Diagnosis present

## 2024-09-28 DIAGNOSIS — D61818 Other pancytopenia: Secondary | ICD-10-CM | POA: Diagnosis present

## 2024-10-05 ENCOUNTER — Ambulatory Visit: Payer: Self-pay | Admitting: Nurse Practitioner

## 2024-10-05 ENCOUNTER — Telehealth: Admitting: Physician Assistant

## 2024-10-05 DIAGNOSIS — R12 Heartburn: Secondary | ICD-10-CM | POA: Diagnosis not present

## 2024-10-05 DIAGNOSIS — K7460 Unspecified cirrhosis of liver: Secondary | ICD-10-CM | POA: Diagnosis not present

## 2024-10-05 DIAGNOSIS — J069 Acute upper respiratory infection, unspecified: Secondary | ICD-10-CM

## 2024-10-05 MED ORDER — FAMOTIDINE 20 MG PO TABS: 20.0000 mg | ORAL_TABLET | Freq: Two times a day (BID) | ORAL | 0 refills | Status: DC

## 2024-10-05 NOTE — Progress Notes (Signed)
 We are sorry that you are not feeling well.  Here is how we plan to help!  Based on what you shared with me it looks like you most likely have Gastroesophageal Reflux Disease (GERD)  Gastroesophageal reflux disease (GERD) happens when acid from your stomach flows up into the esophagus.  When acid comes in contact with the esophagus, the acid causes sorenss (inflammation) in the esophagus.  Over time, GERD may create small holes (ulcers) in the lining of the esophagus.  I recommend using over the counter Pepcid 20mg  one by mouth twice a day for two weeks. I will prescribe for you. Keep scheduled follow up with GI on 11/15/24.  Your symptoms should improve in the next day or two.  You can use antacids as needed until symptoms resolve.  Call us  if your heartburn worsens, you have trouble swallowing, weight loss, spitting up blood or recurrent vomiting.  Home Care: May include lifestyle changes such as weight loss, quitting smoking and alcohol consumption Avoid foods and drinks that make your symptoms worse, such as: Caffeine or alcoholic drinks Chocolate Peppermint or mint flavorings Garlic and onions Spicy foods Citrus fruits, such as oranges, lemons, or limes Tomato-based foods such as sauce, chili, salsa and pizza Fried and fatty foods Avoid lying down for 3 hours prior to your bedtime or prior to taking a nap Eat small, frequent meals instead of a large meals Wear loose-fitting clothing.  Do not wear anything tight around your waist that causes pressure on your stomach. Raise the head of your bed 6 to 8 inches with wood blocks to help you sleep.  Extra pillows will not help.  Seek Help Right Away If: You have pain in your arms, neck, jaw, teeth or back Your pain increases or changes in intensity or duration You develop nausea, vomiting or sweating (diaphoresis) You develop shortness of breath or you faint Your vomit is green, yellow, black or looks like coffee grounds or blood Your  stool is red, bloody or black  These symptoms could be signs of other problems, such as heart disease, gastric bleeding or esophageal bleeding.  Make sure you : Understand these instructions. Will watch your condition. Will get help right away if you are not doing well or get worse.  Your e-visit answers were reviewed by a board certified advanced clinical practitioner to complete your personal care plan.  Depending on the condition, your plan could have included both over the counter or prescription medications.  If there is a problem please reply  once you have received a response from your provider.  Your safety is important to us .  If you have drug allergies check your prescription carefully.    You can use MyChart to ask questions about today's visit, request a non-urgent call back, or ask for a work or school excuse for 24 hours related to this e-Visit. If it has been greater than 24 hours you will need to follow up with your provider, or enter a new e-Visit to address those concerns.  You will get an e-mail in the next two days asking about your experience.  I hope that your e-visit has been valuable and will speed your recovery. Thank you for using e-visits.  I have spent 5 minutes in review of e-visit questionnaire, review and updating patient chart, medical decision making and response to patient.   Delon CHRISTELLA Dickinson, PA-C

## 2024-10-06 MED ORDER — PROMETHAZINE-DM 6.25-15 MG/5ML PO SYRP: 5.0000 mL | ORAL_SOLUTION | Freq: Four times a day (QID) | ORAL | 0 refills | Status: AC | PRN

## 2024-10-06 MED ORDER — FLUTICASONE PROPIONATE 50 MCG/ACT NA SUSP: 2.0000 | Freq: Every day | NASAL | 6 refills | Status: AC

## 2024-10-06 MED ORDER — BENZONATATE 200 MG PO CAPS: 200.0000 mg | ORAL_CAPSULE | Freq: Two times a day (BID) | ORAL | 0 refills | Status: AC | PRN

## 2024-10-06 MED ORDER — PSEUDOEPH-BROMPHEN-DM 30-2-10 MG/5ML PO SYRP: 5.0000 mL | ORAL_SOLUTION | Freq: Four times a day (QID) | ORAL | 0 refills | Status: DC | PRN

## 2024-10-06 MED ORDER — LEVOCETIRIZINE DIHYDROCHLORIDE 5 MG PO TABS: 5.0000 mg | ORAL_TABLET | Freq: Every evening | ORAL | 0 refills | Status: AC

## 2024-10-06 NOTE — Addendum Note (Signed)
 Addended byBETHA ROLAN BERTHOLD on: 10/06/2024 11:01 AM   Modules accepted: Orders

## 2024-10-06 NOTE — Progress Notes (Signed)

## 2024-10-18 ENCOUNTER — Inpatient Hospital Stay: Attending: Nurse Practitioner

## 2024-10-25 ENCOUNTER — Telehealth: Payer: Self-pay | Admitting: Hematology

## 2024-10-25 NOTE — Telephone Encounter (Signed)
 Called in  to get rescheduled for Lab appt and  pt is aware of her appt and time.

## 2024-10-26 ENCOUNTER — Inpatient Hospital Stay: Attending: Nurse Practitioner

## 2024-11-04 ENCOUNTER — Ambulatory Visit: Admitting: Gastroenterology

## 2024-11-15 ENCOUNTER — Encounter: Payer: Self-pay | Admitting: Gastroenterology

## 2024-11-15 ENCOUNTER — Other Ambulatory Visit

## 2024-11-15 ENCOUNTER — Ambulatory Visit: Admitting: Gastroenterology

## 2024-11-15 VITALS — BP 112/66 | HR 78 | Ht 63.0 in | Wt 161.2 lb

## 2024-11-15 DIAGNOSIS — D696 Thrombocytopenia, unspecified: Secondary | ICD-10-CM | POA: Diagnosis not present

## 2024-11-15 DIAGNOSIS — K7460 Unspecified cirrhosis of liver: Secondary | ICD-10-CM | POA: Diagnosis not present

## 2024-11-15 DIAGNOSIS — K219 Gastro-esophageal reflux disease without esophagitis: Secondary | ICD-10-CM

## 2024-11-15 DIAGNOSIS — K7469 Other cirrhosis of liver: Secondary | ICD-10-CM | POA: Diagnosis not present

## 2024-11-15 DIAGNOSIS — R12 Heartburn: Secondary | ICD-10-CM

## 2024-11-15 DIAGNOSIS — D509 Iron deficiency anemia, unspecified: Secondary | ICD-10-CM | POA: Diagnosis not present

## 2024-11-15 LAB — HEPATIC FUNCTION PANEL
ALT: 29 U/L (ref 3–35)
AST: 46 U/L — ABNORMAL HIGH (ref 5–37)
Albumin: 3.3 g/dL — ABNORMAL LOW (ref 3.5–5.2)
Alkaline Phosphatase: 172 U/L — ABNORMAL HIGH (ref 39–117)
Bilirubin, Direct: 0.3 mg/dL (ref 0.1–0.3)
Total Bilirubin: 0.8 mg/dL (ref 0.2–1.2)
Total Protein: 6.9 g/dL (ref 6.0–8.3)

## 2024-11-15 LAB — CBC WITH DIFFERENTIAL/PLATELET
Basophils Absolute: 0 K/uL (ref 0.0–0.1)
Basophils Relative: 0.7 % (ref 0.0–3.0)
Eosinophils Absolute: 0.1 K/uL (ref 0.0–0.7)
Eosinophils Relative: 4.4 % (ref 0.0–5.0)
HCT: 32.2 % — ABNORMAL LOW (ref 36.0–46.0)
Hemoglobin: 10.9 g/dL — ABNORMAL LOW (ref 12.0–15.0)
Lymphocytes Relative: 47.1 % — ABNORMAL HIGH (ref 12.0–46.0)
Lymphs Abs: 1.6 K/uL (ref 0.7–4.0)
MCHC: 33.8 g/dL (ref 30.0–36.0)
MCV: 91.9 fl (ref 78.0–100.0)
Monocytes Absolute: 0.3 K/uL (ref 0.1–1.0)
Monocytes Relative: 9.1 % (ref 3.0–12.0)
Neutro Abs: 1.3 K/uL — ABNORMAL LOW (ref 1.4–7.7)
Neutrophils Relative %: 38.7 % — ABNORMAL LOW (ref 43.0–77.0)
Platelets: 77 K/uL — ABNORMAL LOW (ref 150.0–400.0)
RBC: 3.5 Mil/uL — ABNORMAL LOW (ref 3.87–5.11)
RDW: 15.9 % — ABNORMAL HIGH (ref 11.5–15.5)
WBC: 3.4 K/uL — ABNORMAL LOW (ref 4.0–10.5)

## 2024-11-15 LAB — BASIC METABOLIC PANEL WITH GFR
BUN: 8 mg/dL (ref 6–23)
CO2: 26 meq/L (ref 19–32)
Calcium: 8.5 mg/dL (ref 8.4–10.5)
Chloride: 108 meq/L (ref 96–112)
Creatinine, Ser: 0.71 mg/dL (ref 0.40–1.20)
GFR: 105.98 mL/min
Glucose, Bld: 86 mg/dL (ref 70–99)
Potassium: 4.2 meq/L (ref 3.5–5.1)
Sodium: 139 meq/L (ref 135–145)

## 2024-11-15 LAB — PROTIME-INR
INR: 1.3 ratio — ABNORMAL HIGH (ref 0.8–1.0)
Prothrombin Time: 13.6 s — ABNORMAL HIGH (ref 9.6–13.1)

## 2024-11-15 MED ORDER — FUROSEMIDE 20 MG PO TABS: 20.0000 mg | ORAL_TABLET | Freq: Every day | ORAL | 0 refills | Status: AC

## 2024-11-15 MED ORDER — LACTULOSE 10 GM/15ML PO SOLN: 30.0000 g | Freq: Every day | ORAL | 0 refills | Status: AC | PRN

## 2024-11-15 MED ORDER — SPIRONOLACTONE 100 MG PO TABS: 100.0000 mg | ORAL_TABLET | Freq: Every day | ORAL | 5 refills | Status: AC

## 2024-11-15 MED ORDER — FAMOTIDINE 20 MG PO TABS: 20.0000 mg | ORAL_TABLET | Freq: Two times a day (BID) | ORAL | 0 refills | Status: AC

## 2024-11-15 NOTE — Patient Instructions (Addendum)
 Cirrhosis Continue Spironolactone  100 mg daily, refilled Continue Furosemide  20 mg twice daily, refilled  Continue Lactulose  45 mL daily , titrate to have 2-3 BM's day We will need imaging every 6 mths to continue to monitor the liver We will be calling you in the next few weeks to set up a endoscopy and colonoscopy with Dr. Charlanne.  Recommend low sodium diet  GERD Recommend GERD diet Continue famotidine  20 mg twice daily   Your provider has requested that you go to the basement level for lab work before leaving today. Press B on the elevator. The lab is located at the first door on the left as you exit the elevator.  _______________________________________________________  If your blood pressure at your visit was 140/90 or greater, please contact your primary care physician to follow up on this.  _______________________________________________________  If you are age 40 or older, your body mass index should be between 23-30. Your Body mass index is 28.56 kg/m. If this is out of the aforementioned range listed, please consider follow up with your Primary Care Provider.  If you are age 44 or younger, your body mass index should be between 19-25. Your Body mass index is 28.56 kg/m. If this is out of the aformentioned range listed, please consider follow up with your Primary Care Provider.   ________________________________________________________  The [facility] providers would like to encourage you to use MYCHART to communicate with providers for non-urgent requests or questions.  Due to long hold times on the telephone, sending your provider a message by [facility] may be a faster and more efficient way to get a response.  Please allow 48 business hours for a response.  Please remember that this is for non-urgent requests.  _______________________________________________________  [facility] is using a team-based approach to care.  Your team is made up of your doctor and two  to three APPS. Our APPS (Nurse Practitioners and Physician Assistants) work with your physician to ensure care continuity for you. They are fully qualified to address your health concerns and develop a treatment plan. They communicate directly with your gastroenterologist to care for you. Seeing the Advanced Practice Practitioners on your physician's team can help you by facilitating care more promptly, often allowing for earlier appointments, access to diagnostic testing, procedures, and other specialty referrals.   Thank you for trusting me with your gastrointestinal care. Deanna May, FNP-C

## 2024-11-15 NOTE — Progress Notes (Signed)
 "  Chief Complaint:Follow-up alcoholic cirrhosis  Primary GI Doctor:Dr. Charlanne  HPI:  Patient is a  38  year old female patient with past medical history of alcoholic cirrhosis, thyroid disease, iron  deficiency anemia, who presents for follow-up of her alcoholic cirrhosis.  Interval History Patient last seen in GI office on 07/24/22 by Delon, PA for follow-up.  Patient presents for follow-up on alcoholic cirrhosis Accompanied by her son.  Patient taking Spironolactone  100 mg daily,  Patient taking Furosemide  20 mg twice daily Patient taking Lactulose  45 mL daily   She has 2-3 BM's per day. No blood in stool.   Trace edema in bilateral lower extremities No abdominal swelling   Patient has history of GERD and taking famotidine  20 mg twice daily which works well. She ran out of medication.  Requesting refill. appetite good.   Patient is on iron  supplement 1 tablet daily for iron  deficiency.   No blood thinners.   Reports balance issues and feels her legs are weak.   No alcohol use in past 4 years or more. She smokes 4 cigarettes /day.   Never had EGD/colon.    Patient's family history includes : no esophageal CA, no colon CA, sister passed away with bladder CA  Wt Readings from Last 3 Encounters:  11/15/24 161 lb 4 oz (73.1 kg)  09/05/24 166 lb 8 oz (75.5 kg)  05/24/24 170 lb 3.2 oz (77.2 kg)   Past Medical History:  Diagnosis Date   Cirrhosis, alcoholic (HCC)    Supratherapeutic INR    Thyroid disease     Past Surgical History:  Procedure Laterality Date   INCISION AND DRAINAGE OF WOUND N/A 07/24/2021   Procedure: Abdominal Wound Exploration With Vessel Ligation;  Surgeon: Belinda Cough, MD;  Location: [facility] OR;  Service: General;  Laterality: N/A;    Current Outpatient Medications  Medication Sig Dispense Refill   benzonatate  (TESSALON ) 200 MG capsule Take 1 capsule (200 mg total) by mouth 2 (two) times daily as needed for cough. 20 capsule 0   FEROSUL 325 (65  Fe) MG tablet Take 1 tablet (325 mg total) by mouth daily. 90 tablet 0   famotidine  (PEPCID ) 20 MG tablet Take 1 tablet (20 mg total) by mouth 2 (two) times daily. 60 tablet 0   fluticasone  (FLONASE ) 50 MCG/ACT nasal spray Place 2 sprays into both nostrils daily. (Patient not taking: Reported on 11/15/2024) 16 g 6   furosemide  (LASIX ) 20 MG tablet Take 1 tablet (20 mg total) by mouth daily. 30 tablet 0   gabapentin (NEURONTIN) 100 MG capsule Take 100 mg by mouth 3 (three) times daily. (Patient not taking: Reported on 11/15/2024)     lactulose  (CHRONULAC ) 10 GM/15ML solution Take 45 mLs (30 g total) by mouth daily as needed for mild constipation. 236 mL 0   levocetirizine (XYZAL  ALLERGY 24HR) 5 MG tablet Take 1 tablet (5 mg total) by mouth every evening for 7 days. (Patient not taking: Reported on 11/15/2024) 7 tablet 0   spironolactone  (ALDACTONE ) 100 MG tablet Take 1 tablet (100 mg total) by mouth daily. 30 tablet 5   No current facility-administered medications for this visit.    Allergies as of 11/15/2024   (No Known Allergies)    Family History  Family history unknown: Yes    Review of Systems:    Constitutional: No weight loss, fever, chills, weakness or fatigue HEENT: Eyes: No change in vision  Ears, Nose, Throat:  No change in hearing or congestion Skin: No rash or itching Cardiovascular: No chest pain, chest pressure or palpitations   Respiratory: No SOB or cough Gastrointestinal: See HPI and otherwise negative Genitourinary: No dysuria or change in urinary frequency Neurological: No headache, dizziness or syncope Musculoskeletal: No new muscle or joint pain Hematologic: No bleeding or bruising Psychiatric: No history of depression or anxiety    Physical Exam:  Vital signs: BP 112/66   Pulse 78   Ht 5' 3 (1.6 m)   Wt 161 lb 4 oz (73.1 kg)   BMI 28.56 kg/m   Constitutional:   Pleasant female appears to be in NAD, Well developed, Well nourished, alert  and cooperative Eyes:   PEERL, EOMI. No icterus. Conjunctiva pink. Neck:  Supple Throat: Oral cavity and pharynx without inflammation, swelling or lesion.  Respiratory: Respirations even and unlabored. Lungs clear to auscultation bilaterally.   No wheezes, crackles, or rhonchi.  Cardiovascular: Normal S1, S2. Regular rate and rhythm. No peripheral edema, cyanosis or pallor.  Gastrointestinal:  Soft, nondistended, nontender. No rebound or guarding. Normal bowel sounds. No appreciable masses or hepatomegaly. Rectal:  Not performed.  Msk:  Symmetrical without gross deformities. Without deformity or joint abnormality. Trace edema in bilateral lower extremities. Neurologic:  Alert and  oriented x4;  grossly normal neurologically. No asterixis  Skin:   Dry and intact without significant lesions or rashes.  RELEVANT LABS AND IMAGING: CBC    Latest Ref Rng & Units 09/05/2024   11:45 AM 05/24/2024   10:07 AM 04/05/2024    1:47 PM  CBC  WBC 4.0 - 10.5 K/uL 3.1  3.2  3.8   Hemoglobin 12.0 - 15.0 g/dL 89.0  8.9  7.6   Hematocrit 36.0 - 46.0 % 31.3  27.1  24.7   Platelets 150 - 400 K/uL 49  64  88      CMP     Latest Ref Rng & Units 03/21/2024    9:11 PM 03/18/2024    1:40 PM 07/24/2022    9:22 AM  CMP  Glucose 70 - 99 mg/dL 87  76  75   BUN 6 - 20 mg/dL 7  8  14    Creatinine 0.44 - 1.00 mg/dL 9.27  9.28  9.16   Sodium 135 - 145 mmol/L 136  137  136   Potassium 3.5 - 5.1 mmol/L 3.8  4.2  4.5   Chloride 98 - 111 mmol/L 110  108  106   CO2 22 - 32 mmol/L 18  18  24    Calcium 8.9 - 10.3 mg/dL 8.2  8.5  8.9   Total Protein 6.5 - 8.1 g/dL 6.6   7.6   Total Bilirubin 0.0 - 1.2 mg/dL 1.2   3.0   Alkaline Phos 38 - 126 U/L 146   234   AST 15 - 41 U/L 39   52   ALT 0 - 44 U/L 25   24      Lab Results  Component Value Date   TSH 1.340 03/18/2024   7/22 echo- Left ventricular ejection fraction, by estimation, is 55 to 60%.  09/07/24 AFP marker 2.5  Imaging:  07/2022 US  abd  RUQ IMPRESSION: The hepatic parenchyma is heterogeneous in echogenicity. On prior CT July 23, 2021, there was suggestion of multiple underlying lesions within the liver. As recommended on prior CT, if not previously performed, recommend dedicated evaluation of the liver with pre and post contrast-enhanced abdominal MRI  to exclude underlying lesions.   Mild gallbladder wall thickening, likely secondary to history of cirrhosis. No cholelithiasis or secondary signs of acute cholecystitis.    09/28/24 US  abdomen complete IMPRESSION: 1. Cirrhotic liver morphology with sequela of portal venous hypertension including recanalization of the periumbilical vein. No focal hepatic lesion identified. Given underlying heterogeneity, consider intermittent supplemental screening with dedicated multiphase contrast enhanced CT or MRI.  Computed MELD 3.0 unavailable. One or more values for this score either were not found within the given timeframe or did not fit some other criterion. Computed MELD-Na unavailable. One or more values for this score either were not found within the given timeframe or did not fit some other criterion.    Assessment/Plan: Encounter Diagnoses  Name Primary?   Hepatic cirrhosis, unspecified hepatic cirrhosis type, unspecified whether ascites present (HCC) Yes   Other cirrhosis of liver (HCC)    Thrombocytopenia    Iron  deficiency anemia, unspecified iron  deficiency anemia type    Gastroesophageal reflux disease, unspecified whether esophagitis present    Heartburn    38 year old female patient with Alcoholic cirrhosis, here for follow-up.  Noupdated MELD score. PMH alcoholism. AFP marker 2.5. Stopped drinking alcohol over 4 years ago. Last seen in our office 2 years ago. At that time imaging and endoscopic procedures were ordered along with referral to [facility]. Pt only completed the abdominal u/s. Results showed multiple underlying lesions, recommendations for  MRI which was never completed.  Will recheck her labs today for updated MELD score. Will continue her spironolactone , lasix , and lactulose . No s/o HE. 11/25 abd u/s shows:Cirrhotic liver morphology with sequela of portal venous hypertension including recanalization of the periumbilical vein. No ascites. Recommend supplemental imaging. Will defer imaging to Dr. Charlanne. Also recommend EGD for variceal screening and colonoscopy for colon screening. Will obtain [facility] clearance from [facility] and schedule. Patient GERD well controlled with Pepcid  twice daily, refilled.   Cirrhosis alcoholic  Thrombocytopenia Iron  deficiency anemia -Ordered labs including CBC, BMET, hepatic panel,  PT/INR    -Continue Spironolactone  100 mg daily, refilled - Continue Furosemide  20 mg twice daily, refilled  - Continue Lactulose  45 mL daily , refilled  -dedicated multiphase contrast enhanced CT or MRI -defer to Dr. Charlanne  -Scheduled the patient for an EGD with Dr. Charlanne for variceal screening. The risks and benefits of EGD with possible biopsies and esophageal dilation were discussed with the patient who agrees to proceed. -Schedule for a colonoscopy with Dr. Charlanne. The risks and benefits of colonoscopy with possible polypectomy / biopsies were discussed and the patient agrees to proceed.  -clearance for [facility] sent to [facility] , will call patient to set up -followed by hematology/oncology for IV iron   GERD -continue famotidine  20 mg twice daily , refilled   -6 month fup with APP or Dr. Charlanne  Thank you for the courtesy of this consult. Please call me with any questions or concerns.   Abram Sax, FNP-C [facility] 11/15/2024, 11:55 AM  Cc: Jaycee Greig PARAS, NP  "

## 2024-11-16 ENCOUNTER — Ambulatory Visit: Payer: Self-pay | Admitting: Gastroenterology

## 2024-12-04 NOTE — Progress Notes (Unsigned)
 "     [facility]   [phone] [phone]    Patient Care Team: Jaycee Greig PARAS, NP as PCP - General (Nurse Practitioner)   CHIEF COMPLAINT: Follow up IDA, pancytopenia   CURRENT THERAPY: Oral iron  + IV iron  PRN  INTERVAL HISTORY Michelle Little returns for follow up as scheduled. Last seen by me 09/05/24. She followed up with GI for cirrhosis, compliant with med regimen, and is waiting to be scheduled for endoscopies. Taking oral iron  once daily without side effects. Denies obvious bleeding. Recovered from a cold over the holidays.   ROS  All other systems reviewed and negative  Past Medical History:  Diagnosis Date   Cirrhosis, alcoholic (HCC)    Supratherapeutic INR    Thyroid disease      Past Surgical History:  Procedure Laterality Date   INCISION AND DRAINAGE OF WOUND N/A 07/24/2021   Procedure: Abdominal Wound Exploration With Vessel Ligation;  Surgeon: Belinda Cough, MD;  Location: [facility] OR;  Service: General;  Laterality: N/A;     Outpatient Encounter Medications as of 12/06/2024  Medication Sig   benzonatate  (TESSALON ) 200 MG capsule Take 1 capsule (200 mg total) by mouth 2 (two) times daily as needed for cough.   famotidine  (PEPCID ) 20 MG tablet Take 1 tablet (20 mg total) by mouth 2 (two) times daily.   furosemide  (LASIX ) 20 MG tablet Take 1 tablet (20 mg total) by mouth daily.   lactulose  (CHRONULAC ) 10 GM/15ML solution Take 45 mLs (30 g total) by mouth daily as needed for mild constipation.   spironolactone  (ALDACTONE ) 100 MG tablet Take 1 tablet (100 mg total) by mouth daily.   [DISCONTINUED] FEROSUL 325 (65 Fe) MG tablet Take 1 tablet (325 mg total) by mouth daily.   FEROSUL 325 (65 Fe) MG tablet Take 1 tablet (325 mg total) by mouth daily.   fluticasone  (FLONASE ) 50 MCG/ACT nasal spray Place 2 sprays into both nostrils daily. (Patient not taking: Reported on 12/06/2024)   gabapentin (NEURONTIN) 100 MG capsule Take 100 mg by mouth 3  (three) times daily. (Patient not taking: Reported on 12/06/2024)   levocetirizine (XYZAL  ALLERGY 24HR) 5 MG tablet Take 1 tablet (5 mg total) by mouth every evening for 7 days. (Patient not taking: Reported on 12/06/2024)   No facility-administered encounter medications on file as of 12/06/2024.     Today's Vitals   12/06/24 0920 12/06/24 0926  BP: 110/60   Pulse: 78   Resp: 17   Temp: 98.4 F (36.9 C)   SpO2: 99%   Weight: 169 lb 6.4 oz (76.8 kg)   PainSc:  0-No pain   Body mass index is 30.01 kg/m.   ECOG PERFORMANCE STATUS: 0 - Asymptomatic  PHYSICAL EXAM GENERAL:alert, no distress and comfortable SKIN: superficial facial abrasions  EYES: sclera clear NECK: without mass LYMPH:  no palpable cervical or supraclavicular lymphadenopathy  LUNGS: clear with normal breathing effort HEART: regular rate & rhythm, no lower extremity edema ABDOMEN: abdomen soft, non-tender and normal bowel sounds. No palpable hepatosplenomegaly or mass NEURO: alert & oriented x 3 with fluent speech, no focal motor/sensory deficits   CBC    Latest Ref Rng & Units 12/06/2024    8:58 AM 11/15/2024   10:58 AM 09/05/2024   11:45 AM  CBC  WBC 4.0 - 10.5 K/uL 4.0  3.4  3.1   Hemoglobin 12.0 - 15.0 g/dL 89.9  89.0  89.0   Hematocrit 36.0 - 46.0 % 29.3  32.2  31.3   Platelets 150 - 400 K/uL 66  77.0  49       CMP     Latest Ref Rng & Units 11/15/2024   10:58 AM 03/21/2024    9:11 PM 03/18/2024    1:40 PM  CMP  Glucose 70 - 99 mg/dL 86  87  76   BUN 6 - 23 mg/dL 8  7  8    Creatinine 0.40 - 1.20 mg/dL 9.28  9.27  9.28   Sodium 135 - 145 mEq/L 139  136  137   Potassium 3.5 - 5.1 mEq/L 4.2  3.8  4.2   Chloride 96 - 112 mEq/L 108  110  108   CO2 19 - 32 mEq/L 26  18  18    Calcium 8.4 - 10.5 mg/dL 8.5  8.2  8.5   Total Protein 6.0 - 8.3 g/dL 6.9  6.6    Total Bilirubin 0.2 - 1.2 mg/dL 0.8  1.2    Alkaline Phos 39 - 117 U/L 172  146    AST 5 - 37 U/L 46  39    ALT 3 - 35 U/L 29  25         ASSESSMENT & PLAN:   Anemia -She reports lifelong anemia, 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  -Anemia has persisted past pregnancy.  Lowest Hgb 1.8 in 05/2021 when hospitalized for decompensated cirrhosis, aspiration pneumonia, and critical illness.  Workup during that hospitalization showed folate deficiency, although she is no longer taking folic acid  and most recent folate normal in 03/2024 -Hgb baseline 9 range until it dropped to 6 - 6.8 end of April, s/p 1 unit RBC 03/22/24. S/p total 22 units transfused from 06/17/21 - 03/22/24 -Given intermittently normal MCV this is not thalassemia. Hemoglobinopathies have been ruled out.  -No h/o bariatric surgery or infection. She has cirrhosis, which can contribute to cytopenias. No h/o B12 or folate deficiencies  -likely IDA from menorrhagia  -Further work up shows normal immature plt fraction and low retic. Light chains and SPEP are abnormal but no M spike, this is non specific. ITP is not likely. Worsening plt/anc likely secondary to cirrhosis. A primary bone marrow disorder such as MDS has not been ruled out, but given her age and no symptoms, my suspicion is not high. Will hold off bone marrow biopsy.    Cirrhosis, thrombocytopenia  -Hepatitis panel was negative, felt to be related to heavy alcohol use which she stopped in 05/2021 -Decompensated cirrhosis in the past, LFTs much improved and T. bili is normal -on lasix , aldactone , and lactulose  -Followed by GI.  - Discussed HCC screening including AFP and annual abdominal ultrasound, last done in 2023.  She has been reluctant -She eventually followed up with GI 10/2024 and agreed to endoscopies which are pending clearance/scheduling   Health maintenance  -Applauded her on alcohol cessation and encouraged her to quit smoking.  She plans to quit  and join a gym.  Encouraged healthy active lifestyle -Mammogram benign 03/2024 and Pap smear coming up -Continue age-appropriate cancer screenings and routine care    PLAN: -Michelle Little appears stable, pancytopenia has improved overall, ANC normal  -Continue oral iron , refilled, no need IV iron  at this time -Proceed with endoscopies per GI, pt will let me know if it shows active bleeding -Lab and follow-up  in 6 months, or sooner if needed for new/increased bleeding, symptoms of anemia, or worsening pancytopenia     All questions were answered. The patient knows to call the clinic with any problems, questions or concerns. No barriers to learning were detected.  Verlyn Dannenberg K Leonard Feigel, NP 12/06/2024 "

## 2024-12-06 ENCOUNTER — Inpatient Hospital Stay: Admitting: Nurse Practitioner

## 2024-12-06 ENCOUNTER — Inpatient Hospital Stay: Attending: Nurse Practitioner

## 2024-12-06 ENCOUNTER — Encounter: Payer: Self-pay | Admitting: Nurse Practitioner

## 2024-12-06 VITALS — BP 110/60 | HR 78 | Temp 98.4°F | Resp 17 | Wt 169.4 lb

## 2024-12-06 DIAGNOSIS — D509 Iron deficiency anemia, unspecified: Secondary | ICD-10-CM | POA: Diagnosis not present

## 2024-12-06 DIAGNOSIS — K7469 Other cirrhosis of liver: Secondary | ICD-10-CM

## 2024-12-06 DIAGNOSIS — D61818 Other pancytopenia: Secondary | ICD-10-CM

## 2024-12-06 DIAGNOSIS — D649 Anemia, unspecified: Secondary | ICD-10-CM

## 2024-12-06 LAB — CBC WITH DIFFERENTIAL (CANCER CENTER ONLY)
Abs Immature Granulocytes: 0.01 K/uL (ref 0.00–0.07)
Basophils Absolute: 0 K/uL (ref 0.0–0.1)
Basophils Relative: 1 %
Eosinophils Absolute: 0.1 K/uL (ref 0.0–0.5)
Eosinophils Relative: 3 %
HCT: 29.3 % — ABNORMAL LOW (ref 36.0–46.0)
Hemoglobin: 10 g/dL — ABNORMAL LOW (ref 12.0–15.0)
Immature Granulocytes: 0 %
Lymphocytes Relative: 43 %
Lymphs Abs: 1.8 K/uL (ref 0.7–4.0)
MCH: 30.7 pg (ref 26.0–34.0)
MCHC: 34.1 g/dL (ref 30.0–36.0)
MCV: 89.9 fL (ref 80.0–100.0)
Monocytes Absolute: 0.4 K/uL (ref 0.1–1.0)
Monocytes Relative: 11 %
Neutro Abs: 1.7 K/uL (ref 1.7–7.7)
Neutrophils Relative %: 42 %
Platelet Count: 66 K/uL — ABNORMAL LOW (ref 150–400)
RBC: 3.26 MIL/uL — ABNORMAL LOW (ref 3.87–5.11)
RDW: 16.3 % — ABNORMAL HIGH (ref 11.5–15.5)
WBC Count: 4 K/uL (ref 4.0–10.5)
nRBC: 0 % (ref 0.0–0.2)

## 2024-12-06 LAB — IRON AND IRON BINDING CAPACITY (CC-WL,HP ONLY)
Iron: 123 ug/dL (ref 28–170)
Saturation Ratios: 53 % — ABNORMAL HIGH (ref 10.4–31.8)
TIBC: 234 ug/dL — ABNORMAL LOW (ref 250–450)
UIBC: 111 ug/dL

## 2024-12-06 LAB — FERRITIN: Ferritin: 79 ng/mL (ref 11–307)

## 2024-12-06 LAB — RETIC PANEL
Immature Retic Fract: 21.7 % — ABNORMAL HIGH (ref 2.3–15.9)
RBC.: 3.19 MIL/uL — ABNORMAL LOW (ref 3.87–5.11)
Retic Count, Absolute: 107.2 K/uL (ref 19.0–186.0)
Retic Ct Pct: 3.4 % — ABNORMAL HIGH (ref 0.4–3.1)
Reticulocyte Hemoglobin: 34.1 pg

## 2024-12-06 MED ORDER — FEROSUL 325 (65 FE) MG PO TABS: 325.0000 mg | ORAL_TABLET | Freq: Every day | ORAL | 1 refills | Status: AC

## 2024-12-18 NOTE — Progress Notes (Signed)
 Reviewed: EtOH liver cirrhosis.  No alcohol for last 4 years -Proceed with multiphasic CT Abdo -Needs EGD/colon.  Lets get it done at [facility] (would likely need banding) -Reappointment with Stephane Quest. -Also start low-dose Coreg  3.125 every day, with monitoring of BP -No nonsteroidals. RG

## 2024-12-20 ENCOUNTER — Telehealth: Payer: Self-pay | Admitting: Gastroenterology

## 2024-12-20 DIAGNOSIS — K7030 Alcoholic cirrhosis of liver without ascites: Secondary | ICD-10-CM

## 2024-12-20 MED ORDER — CARVEDILOL 3.125 MG PO TABS: 3.1250 mg | ORAL_TABLET | Freq: Every day | ORAL | 2 refills | Status: AC

## 2024-12-20 NOTE — Telephone Encounter (Signed)
 Spoke with patient about Dr. Charlanne recommendations. Our office will call to set up CT scan and EGD/colon Also instructed patient we would be starting her on low-dose Coreg  3.125 every day, with monitoring of BP.  Patient verbalizes understanding.

## 2024-12-21 ENCOUNTER — Other Ambulatory Visit: Payer: Self-pay

## 2024-12-21 DIAGNOSIS — K7460 Unspecified cirrhosis of liver: Secondary | ICD-10-CM

## 2024-12-21 DIAGNOSIS — D509 Iron deficiency anemia, unspecified: Secondary | ICD-10-CM

## 2024-12-21 DIAGNOSIS — K7030 Alcoholic cirrhosis of liver without ascites: Secondary | ICD-10-CM

## 2024-12-30 ENCOUNTER — Ambulatory Visit (HOSPITAL_COMMUNITY): Admission: RE | Admit: 2024-12-30 | Source: Ambulatory Visit

## 2024-12-30 DIAGNOSIS — K7460 Unspecified cirrhosis of liver: Secondary | ICD-10-CM

## 2024-12-30 DIAGNOSIS — K7030 Alcoholic cirrhosis of liver without ascites: Secondary | ICD-10-CM

## 2024-12-30 DIAGNOSIS — D509 Iron deficiency anemia, unspecified: Secondary | ICD-10-CM

## 2024-12-30 MED ORDER — IOHEXOL 350 MG/ML SOLN: 100.0000 mL | Freq: Once | INTRAVENOUS | Status: DC | PRN

## 2024-12-30 MED ORDER — IOHEXOL 300 MG/ML  SOLN
100.0000 mL | Freq: Once | INTRAMUSCULAR | Status: AC | PRN
  Administered 2024-12-30: 100 mL via INTRAVENOUS

## 2025-06-05 ENCOUNTER — Inpatient Hospital Stay: Admitting: Nurse Practitioner

## 2025-06-05 ENCOUNTER — Inpatient Hospital Stay
